# Patient Record
Sex: Female | Born: 1935
Health system: Southern US, Community
[De-identification: ages and names within clinical notes are randomized; demographics above are authoritative.]

## PROBLEM LIST (undated history)

## (undated) DIAGNOSIS — E785 Hyperlipidemia, unspecified: Secondary | ICD-10-CM

## (undated) DIAGNOSIS — F329 Major depressive disorder, single episode, unspecified: Secondary | ICD-10-CM

## (undated) DIAGNOSIS — F419 Anxiety disorder, unspecified: Secondary | ICD-10-CM

## (undated) DIAGNOSIS — R609 Edema, unspecified: Secondary | ICD-10-CM

## (undated) DIAGNOSIS — E669 Obesity, unspecified: Secondary | ICD-10-CM

## (undated) DIAGNOSIS — I219 Acute myocardial infarction, unspecified: Secondary | ICD-10-CM

## (undated) DIAGNOSIS — IMO0001 Reserved for inherently not codable concepts without codable children: Secondary | ICD-10-CM

## (undated) DIAGNOSIS — R059 Cough, unspecified: Secondary | ICD-10-CM

## (undated) DIAGNOSIS — F32A Depression, unspecified: Secondary | ICD-10-CM

## (undated) DIAGNOSIS — Z9889 Other specified postprocedural states: Secondary | ICD-10-CM

## (undated) DIAGNOSIS — R05 Cough: Secondary | ICD-10-CM

## (undated) DIAGNOSIS — R0989 Other specified symptoms and signs involving the circulatory and respiratory systems: Secondary | ICD-10-CM

## (undated) DIAGNOSIS — I10 Essential (primary) hypertension: Secondary | ICD-10-CM

## (undated) DIAGNOSIS — R112 Nausea with vomiting, unspecified: Secondary | ICD-10-CM

## (undated) DIAGNOSIS — I6529 Occlusion and stenosis of unspecified carotid artery: Secondary | ICD-10-CM

## (undated) DIAGNOSIS — I2510 Atherosclerotic heart disease of native coronary artery without angina pectoris: Secondary | ICD-10-CM

## (undated) DIAGNOSIS — I739 Peripheral vascular disease, unspecified: Secondary | ICD-10-CM

## (undated) DIAGNOSIS — E119 Type 2 diabetes mellitus without complications: Secondary | ICD-10-CM

## (undated) DIAGNOSIS — I872 Venous insufficiency (chronic) (peripheral): Secondary | ICD-10-CM

## (undated) DIAGNOSIS — I209 Angina pectoris, unspecified: Secondary | ICD-10-CM

## (undated) HISTORY — DX: Acute myocardial infarction, unspecified: I21.9

## (undated) HISTORY — DX: Type 2 diabetes mellitus without complications: E11.9

## (undated) HISTORY — PX: EYE SURGERY: SHX253

## (undated) HISTORY — DX: Occlusion and stenosis of unspecified carotid artery: I65.29

## (undated) HISTORY — DX: Obesity, unspecified: E66.9

## (undated) HISTORY — DX: Essential (primary) hypertension: I10

## (undated) HISTORY — DX: Atherosclerotic heart disease of native coronary artery without angina pectoris: I25.10

## (undated) HISTORY — DX: Hyperlipidemia, unspecified: E78.5

## (undated) HISTORY — DX: Peripheral vascular disease, unspecified: I73.9

## (undated) HISTORY — DX: Venous insufficiency (chronic) (peripheral): I87.2

## (undated) HISTORY — DX: Edema, unspecified: R60.9

## (undated) HISTORY — DX: Other specified symptoms and signs involving the circulatory and respiratory systems: R09.89

---

## 1993-07-26 DIAGNOSIS — I219 Acute myocardial infarction, unspecified: Secondary | ICD-10-CM

## 1993-07-26 DIAGNOSIS — I209 Angina pectoris, unspecified: Secondary | ICD-10-CM

## 1993-07-26 HISTORY — DX: Acute myocardial infarction, unspecified: I21.9

## 1993-07-26 HISTORY — DX: Angina pectoris, unspecified: I20.9

## 2004-07-26 HISTORY — PX: CORONARY ANGIOPLASTY WITH STENT PLACEMENT: SHX49

## 2004-09-16 ENCOUNTER — Inpatient Hospital Stay (HOSPITAL_COMMUNITY): Admission: EM | Admit: 2004-09-16 | Discharge: 2004-09-18 | Payer: Self-pay | Admitting: Emergency Medicine

## 2004-09-16 ENCOUNTER — Ambulatory Visit: Payer: Self-pay | Admitting: Cardiovascular Disease

## 2004-09-16 ENCOUNTER — Ambulatory Visit: Payer: Self-pay | Admitting: Family Medicine

## 2004-09-29 ENCOUNTER — Ambulatory Visit: Payer: Self-pay | Admitting: Cardiovascular Disease

## 2004-10-29 ENCOUNTER — Ambulatory Visit: Payer: Self-pay

## 2004-11-03 ENCOUNTER — Ambulatory Visit: Payer: Self-pay | Admitting: Cardiology

## 2004-11-19 ENCOUNTER — Ambulatory Visit: Payer: Self-pay | Admitting: Cardiology

## 2004-11-24 ENCOUNTER — Ambulatory Visit: Payer: Self-pay | Admitting: Cardiology

## 2004-11-24 ENCOUNTER — Ambulatory Visit (HOSPITAL_COMMUNITY): Admission: RE | Admit: 2004-11-24 | Discharge: 2004-11-24 | Payer: Self-pay | Admitting: Cardiology

## 2004-12-14 ENCOUNTER — Ambulatory Visit: Payer: Self-pay | Admitting: Cardiology

## 2005-02-23 ENCOUNTER — Ambulatory Visit: Payer: Self-pay | Admitting: Cardiology

## 2005-02-26 ENCOUNTER — Ambulatory Visit (HOSPITAL_COMMUNITY): Admission: RE | Admit: 2005-02-26 | Discharge: 2005-02-27 | Payer: Self-pay | Admitting: Cardiology

## 2005-02-26 ENCOUNTER — Ambulatory Visit: Payer: Self-pay | Admitting: Cardiology

## 2005-04-05 ENCOUNTER — Ambulatory Visit: Payer: Self-pay | Admitting: Internal Medicine

## 2005-08-06 ENCOUNTER — Ambulatory Visit: Payer: Self-pay | Admitting: Cardiovascular Disease

## 2005-09-07 ENCOUNTER — Ambulatory Visit: Payer: Self-pay | Admitting: Cardiology

## 2005-10-05 ENCOUNTER — Ambulatory Visit: Payer: Self-pay | Admitting: Cardiology

## 2005-12-02 ENCOUNTER — Ambulatory Visit: Payer: Self-pay | Admitting: Cardiovascular Disease

## 2006-07-05 ENCOUNTER — Ambulatory Visit: Payer: Self-pay | Admitting: Cardiovascular Disease

## 2006-08-02 ENCOUNTER — Ambulatory Visit: Payer: Self-pay

## 2007-01-11 ENCOUNTER — Ambulatory Visit: Payer: Self-pay | Admitting: Cardiovascular Disease

## 2007-01-11 LAB — CONVERTED CEMR LAB
ALT: 15 units/L (ref 0–40)
Albumin: 3.7 g/dL (ref 3.5–5.2)
BUN: 17 mg/dL (ref 6–23)
Basophils Absolute: 0.1 10*3/uL (ref 0.0–0.1)
Basophils Relative: 0.6 % (ref 0.0–1.0)
CO2: 30 meq/L (ref 19–32)
Calcium: 9.5 mg/dL (ref 8.4–10.5)
Eosinophils Absolute: 0.3 10*3/uL (ref 0.0–0.6)
Eosinophils Relative: 2.6 % (ref 0.0–5.0)
Free T4: 0.9 ng/dL (ref 0.6–1.6)
GFR calc non Af Amer: 52 mL/min
Glucose, Bld: 179 mg/dL — ABNORMAL HIGH (ref 70–99)
Hemoglobin: 13.8 g/dL (ref 12.0–15.0)
MCV: 91.1 fL (ref 78.0–100.0)
Potassium: 4.3 meq/L (ref 3.5–5.1)
RBC: 4.58 M/uL (ref 3.87–5.11)
Sodium: 142 meq/L (ref 135–145)
TSH: 4.58 microintl units/mL (ref 0.35–5.50)
WBC: 10.8 10*3/uL — ABNORMAL HIGH (ref 4.5–10.5)

## 2007-02-28 ENCOUNTER — Ambulatory Visit: Payer: Self-pay

## 2007-08-11 ENCOUNTER — Ambulatory Visit: Payer: Self-pay | Admitting: Cardiovascular Disease

## 2008-03-14 ENCOUNTER — Ambulatory Visit: Payer: Self-pay | Admitting: Cardiovascular Disease

## 2008-03-14 ENCOUNTER — Ambulatory Visit: Payer: Self-pay

## 2008-04-12 ENCOUNTER — Ambulatory Visit: Payer: Self-pay | Admitting: Internal Medicine

## 2008-11-01 ENCOUNTER — Encounter: Payer: Self-pay | Admitting: Cardiovascular Disease

## 2008-11-01 ENCOUNTER — Ambulatory Visit: Payer: Self-pay

## 2008-11-01 ENCOUNTER — Ambulatory Visit: Payer: Self-pay | Admitting: Cardiovascular Disease

## 2008-11-25 ENCOUNTER — Telehealth: Payer: Self-pay | Admitting: Cardiovascular Disease

## 2009-05-29 DIAGNOSIS — I872 Venous insufficiency (chronic) (peripheral): Secondary | ICD-10-CM

## 2009-05-29 DIAGNOSIS — I10 Essential (primary) hypertension: Secondary | ICD-10-CM

## 2009-05-29 DIAGNOSIS — E785 Hyperlipidemia, unspecified: Secondary | ICD-10-CM

## 2009-05-29 DIAGNOSIS — E1159 Type 2 diabetes mellitus with other circulatory complications: Secondary | ICD-10-CM | POA: Insufficient documentation

## 2009-05-29 DIAGNOSIS — I2510 Atherosclerotic heart disease of native coronary artery without angina pectoris: Secondary | ICD-10-CM

## 2009-05-29 DIAGNOSIS — E1169 Type 2 diabetes mellitus with other specified complication: Secondary | ICD-10-CM | POA: Insufficient documentation

## 2009-05-29 DIAGNOSIS — I739 Peripheral vascular disease, unspecified: Secondary | ICD-10-CM

## 2009-05-29 DIAGNOSIS — R609 Edema, unspecified: Secondary | ICD-10-CM

## 2009-05-30 ENCOUNTER — Ambulatory Visit: Payer: Self-pay | Admitting: Cardiovascular Disease

## 2009-05-30 ENCOUNTER — Ambulatory Visit: Payer: Self-pay

## 2009-05-30 DIAGNOSIS — R0989 Other specified symptoms and signs involving the circulatory and respiratory systems: Secondary | ICD-10-CM

## 2009-05-30 DIAGNOSIS — F17200 Nicotine dependence, unspecified, uncomplicated: Secondary | ICD-10-CM | POA: Insufficient documentation

## 2009-10-08 ENCOUNTER — Encounter (INDEPENDENT_AMBULATORY_CARE_PROVIDER_SITE_OTHER): Payer: Self-pay | Admitting: *Deleted

## 2009-11-25 ENCOUNTER — Ambulatory Visit: Payer: Self-pay | Admitting: Cardiovascular Disease

## 2009-11-25 ENCOUNTER — Telehealth: Payer: Self-pay | Admitting: Cardiovascular Disease

## 2009-12-30 ENCOUNTER — Ambulatory Visit: Payer: Self-pay

## 2009-12-30 ENCOUNTER — Encounter: Payer: Self-pay | Admitting: Cardiovascular Disease

## 2010-01-14 ENCOUNTER — Ambulatory Visit: Payer: Self-pay | Admitting: Cardiovascular Disease

## 2010-02-19 ENCOUNTER — Ambulatory Visit: Payer: Self-pay | Admitting: Vascular Surgery

## 2010-02-19 ENCOUNTER — Encounter: Payer: Self-pay | Admitting: Cardiovascular Disease

## 2010-06-05 ENCOUNTER — Encounter (INDEPENDENT_AMBULATORY_CARE_PROVIDER_SITE_OTHER): Payer: Self-pay | Admitting: *Deleted

## 2010-06-05 ENCOUNTER — Ambulatory Visit: Payer: Self-pay | Admitting: Cardiovascular Disease

## 2010-08-21 ENCOUNTER — Ambulatory Visit
Admission: RE | Admit: 2010-08-21 | Discharge: 2010-08-21 | Payer: Self-pay | Source: Home / Self Care | Attending: Cardiovascular Disease | Admitting: Cardiovascular Disease

## 2010-08-21 ENCOUNTER — Encounter: Payer: Self-pay | Admitting: Cardiovascular Disease

## 2010-08-21 ENCOUNTER — Ambulatory Visit: Admission: RE | Admit: 2010-08-21 | Discharge: 2010-08-21 | Payer: Self-pay | Source: Home / Self Care

## 2010-08-25 NOTE — Letter (Signed)
Summary: Appointment - Reminder 2  [facility], Main Office  [address]   [place], [place] [address]   Phone: [phone]  Fax: [phone]     October 08, 2009 MRN: [id number]   [facility] [address] [place], [place]  [address]   Dear Ms. Orchard,  Our records indicate that it is time to schedule a follow-up appointment with Dr. Eden Emms. It is very important that we reach you to schedule this appointment. We look forward to participating in your health care needs. Please contact us at the number listed above at your earliest convenience to schedule your appointment.  If you are unable to make an appointment at this time, give [place] a call so we can update our records.     Sincerely,   Migdalia Dk [place] Scheduling Team

## 2010-08-25 NOTE — Assessment & Plan Note (Signed)
Summary: F6M/DM   History of Present Illness: [name] [name] returns today for followup.  She has had previous stenting of theright coronary artery.  She has had resting tachycardia.  We started her on beta-blockers and she improved.  We did not find a good reason forher tachycardia, although she is diabetic and may have some autonomic dysfunction.  She has good LV function. She has bilateral carotid disease. Her last duplex in 11/10 showed 40-59% bilateral disease.  She needs a F/U this month.  There have been no TIA like symptoms. She has smoked since age 89.  She is not motivated to quit.  I counseled her for less than 10 minutes She had previously tried patches 2 years agao with limited success and they were too expensive. She does not want to try Chantix or Welbutrin.   Her BS has been reasonably controlled despite a poor diet.    Current Problems (verified): 1)  Smoker  (ICD-305.1) 2)  Carotid Artery Disease  (ICD-433.10) 3)  Venous Insufficiency  (ICD-459.81) 4)  Hyperlipidemia  (ICD-272.4) 5)  Hypertension  (ICD-401.9) 6)  Pvd  (ICD-443.9) 7)  Dm  (ICD-250.00) 8)  Cad  (ICD-414.00) 9)  Edema  (ICD-782.3) 10)  Obesity  (ICD-278.00)  Current Medications (verified): 1)  Plavix 75 Mg Tabs (Clopidogrel Bisulfate) .Marland Kitchen.. 1 Tab By Mouth Once Daily 2)  Aspirin 325 Mg  Tabs (Aspirin) .Marland Kitchen.. 1 Tab By Mouth Once Daily 3)  Lisinopril-Hydrochlorothiazide 20-12.5 Mg Tabs (Lisinopril-Hydrochlorothiazide) .Marland Kitchen.. 1 Tab By Mouth Once Daily 4)  Lipitor 80 Mg Tabs (Atorvastatin Calcium) .Marland Kitchen.. 1 Tab By Mouth Once Daily 5)  Janumet 50-500 Mg Tabs (Sitagliptin-Metformin Hcl) .Marland Kitchen.. 1 Tab By Mouth Two Times A Day 6)  Pletal 100 Mg Tabs (Cilostazol) .Marland Kitchen.. 1 Tab By Mouth Two Times A Day 7)  Glimepiride 2 Mg Tabs (Glimepiride) .Marland Kitchen.. 1 Tab By Mouth Once Daily 8)  Toprol Xl 50 Mg Xr24h-Tab (Metoprolol Succinate) .Marland Kitchen.. 1 and 1/2 Tab Two Times A Day 9)  Nitrostat 0.4 Mg Subl (Nitroglycerin) .Marland Kitchen.. 1 Tablet Under Tongue At Onset of  Chest Pain; You May Repeat Every 5 Minutes For Up To 3 Doses.  Allergies (verified): No Known Drug Allergies  Past History:  Past Medical History: Last updated: 05/30/2009 Bruit IMI with RCA stent Hyperlipidemia Hypertensoin Smoking  Past Surgical History: Last updated: 05/29/2009  PROCEDURE:  Left heart catheterization, coronary angiography, left ventriculography, bare metal stenting of the proximal right coronary artery. Successful placement of bare-metal stent in the proximal right coronary artery with excellent angiographic result.  She should be continued on Plavix for at least a year and aspirin should be  continued indefinitely.  Statin, beta blocker and ACE inhibitor have been initiated during this hospitalization and should be continued. WED/MEDQ  D:  09/17/2004  T:  09/18/2004  Job:  [id number] cc:   Delaney Meigs, M.D.  Charlton Haws, M.D.    Unsuccessful right common iliac artery angioplasty.  Family History: Last updated: 05/30/2009 non-contributory  Social History: Last updated: 05/30/2009 Retired Smoker since age 9 Non-drinker Sedentary  Review of Systems       Denies fever, malais, weight loss, blurry vision, decreased visual acuity, cough, sputum, SOB, hemoptysis, pleuritic pain, palpitaitons, heartburn, abdominal pain, melena, lower extremity edema, claudication, or rash.   Vital Signs:  Patient profile:   75 year old female Height:      61 inches Weight:      205 pounds BMI:     38.87 Pulse rate:   77 /  minute Resp:     12 per minute BP sitting:   120 / 80  (left arm)  Vitals Entered By: Kem Parkinson (Nov 25, 2009 10:07 AM)  Physical Exam  General:  Affect appropriate Healthy:  appears stated age HEENT: normal Neck supple with no adenopathy JVP normal bilateral  bruits no thyromegaly Lungs clear with no wheezing and good diaphragmatic motion Heart:  S1/S2 no murmur,rub, gallop or click PMI normal Abdomen: benighn, BS positve, no  tenderness, no AAA no bruit.  No HSM or HJR Distal pulses intact with no bruits Plus one bilateral edema Neuro non-focal Skin warm and dry    Impression & Recommendations:  Problem # 1:  SMOKER (ICD-305.1) Counseled not motivated to quit.  Will call in Welbutrin at her request  Problem # 2:  CAROTID ARTERY DISEASE (ICD-433.10) F/U duplex this month.  Continue antiplatlet Rx Her updated medication list for this problem includes:    Plavix 75 Mg Tabs (Clopidogrel bisulfate) .Marland Kitchen... 1 tab by mouth once daily    Aspirin 325 Mg Tabs (Aspirin) .Marland Kitchen... 1 tab by mouth once daily    Pletal 100 Mg Tabs (Cilostazol) .Marland Kitchen... 1 tab by mouth two times a day  Orders: Carotid Duplex (Carotid Duplex)  Problem # 3:  VENOUS INSUFFICIENCY (ICD-459.81) Continue as needed diuretic and weigth loss efforts.  Low sodium diet  Problem # 4:  HYPERTENSION (ICD-401.9) Well controlled Her updated medication list for this problem includes:    Aspirin 325 Mg Tabs (Aspirin) .Marland Kitchen... 1 tab by mouth once daily    Lisinopril-hydrochlorothiazide 20-12.5 Mg Tabs (Lisinopril-hydrochlorothiazide) .Marland Kitchen... 1 tab by mouth once daily    Toprol Xl 50 Mg Xr24h-tab (Metoprolol succinate) .Marland Kitchen... 1 and 1/2 tab two times a day  Problem # 5:  CAD (ICD-414.00) Stable no angina Continue ASA and BB Her updated medication list for this problem includes:    Plavix 75 Mg Tabs (Clopidogrel bisulfate) .Marland Kitchen... 1 tab by mouth once daily    Aspirin 325 Mg Tabs (Aspirin) .Marland Kitchen... 1 tab by mouth once daily    Lisinopril-hydrochlorothiazide 20-12.5 Mg Tabs (Lisinopril-hydrochlorothiazide) .Marland Kitchen... 1 tab by mouth once daily    Pletal 100 Mg Tabs (Cilostazol) .Marland Kitchen... 1 tab by mouth two times a day    Toprol Xl 50 Mg Xr24h-tab (Metoprolol succinate) .Marland Kitchen... 1 and 1/2 tab two times a day    Nitrostat 0.4 Mg Subl (Nitroglycerin) .Marland Kitchen... 1 tablet under tongue at onset of chest pain; you may repeat every 5 minutes for up to 3 doses.  Problem # 6:  PVD  (ICD-443.9) Stable with minimal claudication.  Continue Pletal  Patient Instructions: 1)  Your physician recommends that you schedule a follow-up appointment in: 6 MONTHS 2)  Your physician has requested that you have a carotid duplex. This test is an ultrasound of the carotid arteries in your neck. It looks at blood flow through these arteries that supply the brain with blood. Allow one hour for this exam. There are no restrictions or special instructions.   EKG Report  Procedure date:  11/25/2009  Findings:      NSR 77 Normal ECG

## 2010-08-25 NOTE — Miscellaneous (Signed)
Summary: Orders Update  Clinical Lists Changes  Orders: Added new Test order of Arterial Duplex Lower Extremity (Arterial Duplex Low) - Signed 

## 2010-08-25 NOTE — Progress Notes (Signed)
Summary: pt needs refill  Phone Note Refill Request Message from:  Pharmacy on [facility] [address]  Refills Requested: Medication #1:  LISINOPRIL 40 MG TABS 1 tab by mouth once daily per pharm it is suppose to be with HTCZ  Initial call taken by: Omer Jack,  Nov 25, 2009 10:13 AM  Follow-up for Phone Call        Pharmacy states its lisinopril 20-12.5 i gave pt 1 year supply Follow-up by: Kem Parkinson,  Nov 25, 2009 10:30 AM

## 2010-08-25 NOTE — Letter (Signed)
Summary: [facility]   Imported By: Roderic Ovens 03/04/2010 16:24:48  _____________________________________________________________________  External Attachment:    Type:   Image     Comment:   External Document

## 2010-08-25 NOTE — Assessment & Plan Note (Signed)
Summary: 6 MONTH ROV/SL   Visit Type:  Follow-up Primary Provider:  Darl Pikes weeks, PA   History of Present Illness: [name] [name] returns today for followup.  She has had previous stenting of theright coronary artery.  She has had resting tachycardia.  We started her on beta-blockers and she improved.  We did not find a good reason forher tachycardia, although she is diabetic and may have some autonomic dysfunction.  She has good LV function. She has bilateral carotid disease. Her last duplex in June 11 showed 60-79%  bilateral disease.  I reviewed these and was concerned since her systolic velocities were greater than 39m/sec and there was lots of calcium These were at the high end.  F/U CTA also reviewed and  indicated less than 80% disease and Dr Edilia Bo indicated he wanted to do F/U duplex in December and not proceed with surgery on left.  . I spoke with him personally about the decidsion making process in this case   There have been no TIA like symptoms.  She has smoked since age 15.  She is not motivated to quit.  I counseled her for less than 10 minutes She had previously tried patches 2 years agao with limited success and they were too expensive. She does not want to try Chantix or Welbutrin.   Her BS has been reasonably controlled despite a poor diet.   Current Problems (verified): 1)  Smoker  (ICD-305.1) 2)  Carotid Artery Disease  (ICD-433.10) 3)  Venous Insufficiency  (ICD-459.81) 4)  Hyperlipidemia  (ICD-272.4) 5)  Hypertension  (ICD-401.9) 6)  Pvd  (ICD-443.9) 7)  Dm  (ICD-250.00) 8)  Cad  (ICD-414.00) 9)  Edema  (ICD-782.3) 10)  Obesity  (ICD-278.00)  Current Medications (verified): 1)  Plavix 75 Mg Tabs (Clopidogrel Bisulfate) .Marland Kitchen.. 1 Tab By Mouth Once Daily 2)  Aspirin 325 Mg  Tabs (Aspirin) .Marland Kitchen.. 1 Tab By Mouth Once Daily 3)  Lisinopril-Hydrochlorothiazide 20-12.5 Mg Tabs (Lisinopril-Hydrochlorothiazide) .Marland Kitchen.. 1 Tab By Mouth Once Daily 4)  Lipitor 80 Mg Tabs (Atorvastatin Calcium) .Marland Kitchen..  1 Tab By Mouth Once Daily 5)  Janumet 50-500 Mg Tabs (Sitagliptin-Metformin Hcl) .Marland Kitchen.. 1 Tab By Mouth Two Times A Day 6)  Pletal 100 Mg Tabs (Cilostazol) .Marland Kitchen.. 1 Tab By Mouth Two Times A Day 7)  Glimepiride 2 Mg Tabs (Glimepiride) .Marland Kitchen.. 1 Tab By Mouth Once Daily 8)  Toprol Xl 50 Mg Xr24h-Tab (Metoprolol Succinate) .Marland Kitchen.. 1 and 1/2 Tab Two Times A Day 9)  Nitrostat 0.4 Mg Subl (Nitroglycerin) .Marland Kitchen.. 1 Tablet Under Tongue At Onset of Chest Pain; You May Repeat Every 5 Minutes For Up To 3 Doses.  Allergies (verified): No Known Drug Allergies  Past History:  Past Medical History: Last updated: 05/30/2009 Bruit IMI with RCA stent Hyperlipidemia Hypertensoin Smoking  Past Surgical History: Last updated: 05/29/2009  PROCEDURE:  Left heart catheterization, coronary angiography, left ventriculography, bare metal stenting of the proximal right coronary artery. Successful placement of bare-metal stent in the proximal right coronary artery with excellent angiographic result.  She should be continued on Plavix for at least a year and aspirin should be  continued indefinitely.  Statin, beta blocker and ACE inhibitor have been initiated during this hospitalization and should be continued. WED/MEDQ  D:  09/17/2004  T:  09/18/2004  Job:  [id number] cc:   Delaney Meigs, M.D.  Charlton Haws, M.D.    Unsuccessful right common iliac artery angioplasty.  Family History: Last updated: 05/30/2009 non-contributory  Social History: Last updated: 05/30/2009 Retired  Smoker since age 45 Non-drinker Sedentary  Review of Systems       Denies fever, malais, weight loss, blurry vision, decreased visual acuity, cough, sputum, SOB, hemoptysis, pleuritic pain, palpitaitons, heartburn, abdominal pain, melena, lower extremity edema, claudication, or rash.   Vital Signs:  Patient profile:   75 year old female Height:      61 inches Weight:      219 pounds BMI:     41.53 Pulse rate:   85 / minute BP sitting:    138 / 52  (left arm)  Vitals Entered By: Laurance Flatten CMA (June 05, 2010 10:39 AM)  Physical Exam  General:  Affect appropriate Healthy:  appears stated age HEENT: normal Neck supple with no adenopathy JVP normal BILATERAL  bruits no thyromegaly Lungs clear with no wheezing and good diaphragmatic motion Heart:  S1/S2 SEm murmur,rub, gallop or click PMI normal Abdomen: benighn, BS positve, no tenderness, no AAA right  bruit.  No HSM or HJR Distal pulses intact  on left and diminished on right  No edema Neuro non-focal Skin warm and dry    Impression & Recommendations:  Problem # 1:  SMOKER (ICD-305.1) Offerred referral to [facility] and [facility]  Also offerred Chantix or Welbutrin.  Risk of progressive carotid disease with continued smoking discussed.  Patient to call if she is willing to F/ with recomendation  Problem # 2:  CAROTID ARTERY DISEASE (ICD-433.10) F/U duplex in January.  Will likely need left CEA within 2 years  Contineu aSA and Plavix Her updated medication list for this problem includes:    Plavix 75 Mg Tabs (Clopidogrel bisulfate) .Marland Kitchen... 1 tab by mouth once daily    Aspirin 325 Mg Tabs (Aspirin) .Marland Kitchen... 1 tab by mouth once daily    Pletal 100 Mg Tabs (Cilostazol) .Marland Kitchen... 1 tab by mouth two times a day  Orders: EKG w/ Interpretation (93000) Carotid Duplex (Carotid Duplex)  Problem # 3:  HYPERLIPIDEMIA (ICD-272.4) Continue high dose statin labs per primary  Target LDL less than 70 Her updated medication list for this problem includes:    Lipitor 80 Mg Tabs (Atorvastatin calcium) .Marland Kitchen... 1 tab by mouth once daily  Problem # 4:  HYPERTENSION (ICD-401.9) Well controleed Her updated medication list for this problem includes:    Aspirin 325 Mg Tabs (Aspirin) .Marland Kitchen... 1 tab by mouth once daily    Lisinopril-hydrochlorothiazide 20-12.5 Mg Tabs (Lisinopril-hydrochlorothiazide) .Marland Kitchen... 1 tab by mouth once daily    Toprol Xl 50 Mg Xr24h-tab (Metoprolol succinate) .Marland Kitchen... 1 and 1/2  tab two times a day  Problem # 5:  CAD (ICD-414.00) Stabel no angina.   Her updated medication list for this problem includes:    Plavix 75 Mg Tabs (Clopidogrel bisulfate) .Marland Kitchen... 1 tab by mouth once daily    Aspirin 325 Mg Tabs (Aspirin) .Marland Kitchen... 1 tab by mouth once daily    Lisinopril-hydrochlorothiazide 20-12.5 Mg Tabs (Lisinopril-hydrochlorothiazide) .Marland Kitchen... 1 tab by mouth once daily    Pletal 100 Mg Tabs (Cilostazol) .Marland Kitchen... 1 tab by mouth two times a day    Toprol Xl 50 Mg Xr24h-tab (Metoprolol succinate) .Marland Kitchen... 1 and 1/2 tab two times a day    Nitrostat 0.4 Mg Subl (Nitroglycerin) .Marland Kitchen... 1 tablet under tongue at onset of chest pain; you may repeat every 5 minutes for up to 3 doses.  Problem # 6:  PVD (ICD-443.9) Known occlusion of right iliac with prev failed intervention by Dr Samule Ohm.  right ABI .6 or so in past.  Symptoms  stable on Pletal F.U ABI"s with duplex in January.     Patient Instructions: 1)  Your physician recommends that you schedule a follow-up appointment in: JANUARY  WITH DR [facility] CAROTID SAME DAY 2)  Your physician recommends that you continue on your current medications as directed. Please refer to the Current Medication list given to you today. 3)  Your physician has requested that you have a carotid duplex. This test is an ultrasound of the carotid arteries in your neck. It looks at blood flow through these arteries that supply the brain with blood. Allow one hour for this exam. There are no restrictions or special instructions.SE DR NI SHAN SAME DAY    EKG Report  Procedure date:  06/05/2010  Findings:      nsr 85 nonspecific inferolateral ST/T wave changes Abnormal ECG

## 2010-09-02 NOTE — Assessment & Plan Note (Signed)
Summary: PER CHECK OUT/ALSO CARTIOD @ 11:00/SAF   Primary Provider:  Darl Pikes weeks, PA  CC:  check up.  History of Present Illness: [name] [name] returns today for followup.  She has had previous stenting of the right coronary artery.  She has had resting tachycardia.  We started her on beta-blockers and she improved.  We did not find a good reason forher tachycardia, although she is diabetic and may have some autonomic dysfunction.  She has good LV function. She has bilateral carotid disease.  I reviewed duplex from June  and was concerned since her systolic velocities were greater than 52m/sec and there was lots of calcium These were at the high end.  F/U CTA also reviewed and  indicated less than 80% disease and Dr Edilia Bo . I spoke with him personally about the decidsion making process in this case   There have been no TIA like symptoms.  Duplex today was stable   She has smoked since age 73.  She is not motivated to quit.  I counseled her for less than 10 minutes She had previously tried patches 2 years agao with limited success and they were too expensive. She does not want to try Chantix or Welbutrin.   Her BS has been reasonably controlled despite a poor diet.   Current Problems (verified): 1)  Smoker  (ICD-305.1) 2)  Carotid Artery Disease  (ICD-433.10) 3)  Venous Insufficiency  (ICD-459.81) 4)  Hyperlipidemia  (ICD-272.4) 5)  Hypertension  (ICD-401.9) 6)  Pvd  (ICD-443.9) 7)  Dm  (ICD-250.00) 8)  Cad  (ICD-414.00) 9)  Edema  (ICD-782.3) 10)  Obesity  (ICD-278.00)  Current Medications (verified): 1)  Plavix 75 Mg Tabs (Clopidogrel Bisulfate) .Marland Kitchen.. 1 Tab By Mouth Once Daily 2)  Aspirin 325 Mg  Tabs (Aspirin) .Marland Kitchen.. 1 Tab By Mouth Once Daily 3)  Lisinopril-Hydrochlorothiazide 20-12.5 Mg Tabs (Lisinopril-Hydrochlorothiazide) .Marland Kitchen.. 1 Tab By Mouth Once Daily 4)  Lipitor 80 Mg Tabs (Atorvastatin Calcium) .Marland Kitchen.. 1 Tab By Mouth Once Daily 5)  Janumet 50-500 Mg Tabs (Sitagliptin-Metformin Hcl) .Marland Kitchen.. 1 Tab  By Mouth Two Times A Day 6)  Pletal 100 Mg Tabs (Cilostazol) .Marland Kitchen.. 1 Tab By Mouth Two Times A Day 7)  Glimepiride 2 Mg Tabs (Glimepiride) .Marland Kitchen.. 1 Tab By Mouth Once Daily 8)  Toprol Xl 50 Mg Xr24h-Tab (Metoprolol Succinate) .Marland Kitchen.. 1 and 1/2 Tab Two Times A Day 9)  Nitrostat 0.4 Mg Subl (Nitroglycerin) .Marland Kitchen.. 1 Tablet Under Tongue At Onset of Chest Pain; You May Repeat Every 5 Minutes For Up To 3 Doses.  Allergies (verified): No Known Drug Allergies  Past History:  Past Medical History: Last updated: 05/30/2009 Bruit IMI with RCA stent Hyperlipidemia Hypertensoin Smoking  Past Surgical History: Last updated: 05/29/2009  PROCEDURE:  Left heart catheterization, coronary angiography, left ventriculography, bare metal stenting of the proximal right coronary artery. Successful placement of bare-metal stent in the proximal right coronary artery with excellent angiographic result.  She should be continued on Plavix for at least a year and aspirin should be  continued indefinitely.  Statin, beta blocker and ACE inhibitor have been initiated during this hospitalization and should be continued. WED/MEDQ  D:  09/17/2004  T:  09/18/2004  Job:  [id number] cc:   Delaney Meigs, M.D.  Charlton Haws, M.D.    Unsuccessful right common iliac artery angioplasty.  Family History: Last updated: 05/30/2009 non-contributory  Social History: Last updated: 05/30/2009 Retired Smoker since age 46 Non-drinker Sedentary  Review of Systems  Denies fever, malais, weight loss, blurry vision, decreased visual acuity, cough, sputum, SOB, hemoptysis, pleuritic pain, palpitaitons, heartburn, abdominal pain, melena, lower extremity edema, claudication, or rash.   Vital Signs:  Patient profile:   75 year old female Height:      61 inches Weight:      219 pounds BMI:     41.53 Pulse rate:   82 / minute Resp:     12 per minute BP sitting:   143 / 71  (left arm)  Vitals Entered By: Kem Parkinson (August 21, 2010 11:44 AM)  Physical Exam  General:  Affect appropriate Healthy:  appears stated age HEENT: normal Neck supple with no adenopathy JVP normal bilateral  bruits no thyromegaly Lungs clear with no wheezing and good diaphragmatic motion Heart:  S1/S2 no murmur,rub, gallop or click PMI normal Abdomen: benighn, BS positve, no tenderness, no AAA no bruit.  No HSM or HJR Distal pulses intact with no bruits No edema Neuro non-focal Skin warm and dry    Impression & Recommendations:  Problem # 1:  SMOKER (ICD-305.1) No motivatin to quit.  Association with stroke and carotid disease discussed.  Refused Chantix and Welbutrin  Problem # 2:  CAROTID ARTERY DISEASE (ICD-433.10)  Stable duplex.  F/U 6 months Her updated medication list for this problem includes:    Plavix 75 Mg Tabs (Clopidogrel bisulfate) .Marland Kitchen... 1 tab by mouth once daily    Aspirin 325 Mg Tabs (Aspirin) .Marland Kitchen... 1 tab by mouth once daily    Pletal 100 Mg Tabs (Cilostazol) .Marland Kitchen... 1 tab by mouth two times a day  Her updated medication list for this problem includes:    Plavix 75 Mg Tabs (Clopidogrel bisulfate) .Marland Kitchen... 1 tab by mouth once daily    Aspirin 325 Mg Tabs (Aspirin) .Marland Kitchen... 1 tab by mouth once daily    Pletal 100 Mg Tabs (Cilostazol) .Marland Kitchen... 1 tab by mouth two times a day  Problem # 3:  HYPERTENSION (ICD-401.9)  Well contorlled Her updated medication list for this problem includes:    Aspirin 325 Mg Tabs (Aspirin) .Marland Kitchen... 1 tab by mouth once daily    Lisinopril-hydrochlorothiazide 20-12.5 Mg Tabs (Lisinopril-hydrochlorothiazide) .Marland Kitchen... 1 tab by mouth once daily    Toprol Xl 50 Mg Xr24h-tab (Metoprolol succinate) .Marland Kitchen... 1 and 1/2 tab two times a day  Her updated medication list for this problem includes:    Aspirin 325 Mg Tabs (Aspirin) .Marland Kitchen... 1 tab by mouth once daily    Lisinopril-hydrochlorothiazide 20-12.5 Mg Tabs (Lisinopril-hydrochlorothiazide) .Marland Kitchen... 1 tab by mouth once daily    Toprol Xl 50 Mg Xr24h-tab  (Metoprolol succinate) .Marland Kitchen... 1 and 1/2 tab two times a day  Problem # 4:  HYPERLIPIDEMIA (ICD-272.4) Labs per primary  Continue statin.  Target LDL 75 or less Her updated medication list for this problem includes:    Lipitor 80 Mg Tabs (Atorvastatin calcium) .Marland Kitchen... 1 tab by mouth once daily  Her updated medication list for this problem includes:    Lipitor 80 Mg Tabs (Atorvastatin calcium) .Marland Kitchen... 1 tab by mouth once daily  Problem # 5:  CAD (ICD-414.00)  Stable no angina Her updated medication list for this problem includes:    Plavix 75 Mg Tabs (Clopidogrel bisulfate) .Marland Kitchen... 1 tab by mouth once daily    Aspirin 325 Mg Tabs (Aspirin) .Marland Kitchen... 1 tab by mouth once daily    Lisinopril-hydrochlorothiazide 20-12.5 Mg Tabs (Lisinopril-hydrochlorothiazide) .Marland Kitchen... 1 tab by mouth once daily    Pletal 100 Mg Tabs (Cilostazol) .Marland KitchenMarland KitchenMarland KitchenMarland Kitchen  1 tab by mouth two times a day    Toprol Xl 50 Mg Xr24h-tab (Metoprolol succinate) .Marland Kitchen... 1 and 1/2 tab two times a day    Nitrostat 0.4 Mg Subl (Nitroglycerin) .Marland Kitchen... 1 tablet under tongue at onset of chest pain; you may repeat every 5 minutes for up to 3 doses.  Her updated medication list for this problem includes:    Plavix 75 Mg Tabs (Clopidogrel bisulfate) .Marland Kitchen... 1 tab by mouth once daily    Aspirin 325 Mg Tabs (Aspirin) .Marland Kitchen... 1 tab by mouth once daily    Lisinopril-hydrochlorothiazide 20-12.5 Mg Tabs (Lisinopril-hydrochlorothiazide) .Marland Kitchen... 1 tab by mouth once daily    Pletal 100 Mg Tabs (Cilostazol) .Marland Kitchen... 1 tab by mouth two times a day    Toprol Xl 50 Mg Xr24h-tab (Metoprolol succinate) .Marland Kitchen... 1 and 1/2 tab two times a day    Nitrostat 0.4 Mg Subl (Nitroglycerin) .Marland Kitchen... 1 tablet under tongue at onset of chest pain; you may repeat every 5 minutes for up to 3 doses.

## 2010-12-02 ENCOUNTER — Other Ambulatory Visit: Payer: Self-pay | Admitting: *Deleted

## 2010-12-02 MED ORDER — LISINOPRIL-HYDROCHLOROTHIAZIDE 20-12.5 MG PO TABS: 1.0000 | ORAL_TABLET | Freq: Every day | ORAL | Status: DC

## 2010-12-02 MED ORDER — CILOSTAZOL 100 MG PO TABS: 100.0000 mg | ORAL_TABLET | Freq: Two times a day (BID) | ORAL | Status: DC

## 2010-12-08 NOTE — Consult Note (Signed)
VASCULAR SURGERY CONSULTATION   [name] [name], [name] [name]  DOB:  [birth year]                                       02/19/2010  [id number]#:[id number]   I saw the patient in the office today in consultation concerning her  carotid disease.  She was referred by Dr. Eden Emms.  This is a pleasant 75-  year-old woman who is right-handed who underwent her routine carotid  duplex scan at the [place] office.  This was done on 12/30/2009.  This  showed a 60% to 79% right carotid stenosis and also a 60% to 79% left  carotid stenosis.  This prompted a CT angiogram which suggested a 75%  right carotid stenosis and a 90% left carotid stenosis.  She was sent  for vascular consultation.  Of note, she denies any history of stroke,  TIAs, expressive or receptive aphasia or amaurosis fugax.   Her past medical history is significant for adult onset diabetes.  She  does not require insulin.  In addition, she has hypertension,  hypercholesterolemia and coronary artery disease.  She had a myocardial  infarction in June of 1995.  She had a PTA and stent by Dr. Samule Ohm in  2006.  She denies any history of congestive heart failure or history of  COPD.   SOCIAL HISTORY:  She is single.  She has nine children.  She smokes a  half pack per day of cigarettes.   FAMILY HISTORY:  There is no history of premature cardiovascular  disease.   REVIEW OF SYSTEMS:  GENERAL:  She has had no recent weight loss, weight  gain or problems with her appetite.  She is 204 pounds, 5 feet tall.  CARDIOVASCULAR:  She has had no chest pain, chest pressure, palpitations  or arrhythmias.  She does admit to dyspnea on exertion.  She has had  some calf claudication.  She has had no rest pain or nonhealing ulcers.  She has had no history of DVT or phlebitis.  ENT:  She has had some change in her eyesight recently.  MUSCULOSKELETAL:  She does have a history of arthritis.  GI, neurologic, pulmonary, hematologic, GU, psychiatric and  integumentary review of systems is unremarkable and is documented on the  medical history form in her chart.   PHYSICAL EXAMINATION:  General:  This is a pleasant 75 year old woman  who appears her stated age.  Vital signs:  Blood pressure is 124/71,  heart rate is 74, saturation 98%.  HEENT:  Unremarkable.  Lungs:  Are  clear bilaterally to auscultation without rales, rhonchi or wheezing.  Cardiovascular:  She has bilateral carotid bruits.  She has a regular  rate and rhythm.  She has palpable femoral pulses and warm, well-  perfused feet without ischemic ulcers.  Abdomen:  Soft and nontender.  It is difficult to assess for an aneurysm given her size.  She has  normal pitched bowel sounds.  Musculoskeletal:  She has no major  deformities or cyanosis.  Neurological:  She has no focal weakness or  paresthesias.  Skin:  There are no ulcers or rashes.   I have reviewed her duplex scan done at the [facility] office.  By velocity  criteria she has a 60% to 79% proximal right internal carotid artery  stenosis extending into the mid internal carotid artery.  On the left  side she also has  a 60% to 79% carotid stenosis in the mid internal  carotid artery; velocities are slightly higher on the left.  End-  diastolic velocity is 73 cm/sec which would suggest that the stenosis is  clearly less than 80%.  I also reviewed her CT angiogram.  She has a  calcified lesion in the carotid bulb on the left which was interpreted  as 90% although given the degree of calcification I think it is  difficult to say the exact degree of narrowing and I put more emphasis  on the duplex scan.   I have explained that for the asymptomatic patient we typically would  not recommend carotid endarterectomy unless the stenosis progressed to  greater than 80% or she developed new neurologic symptoms.  I have  recommended a followup carotid duplex scan in 6 months and she would  like to have this done at the [place] office.   If the stenosis on either  side progresses to greater than 80% or if she developed new neurologic  symptoms we would be happy to see her back to discuss carotid  endarterectomy.  However, currently both stenoses appear to be less than  80% by duplex and she is asymptomatic so at this point I would not  recommend carotid endarterectomy.  She does know to continue taking her  aspirin and Plavix.     Di Kindle. Edilia Bo, M.D.  Electronically Signed  CSD/MEDQ  D:  02/19/2010  T:  02/20/2010  Job:  [id number]   cc:   Noralyn Pick. Eden Emms, MD, [facility]  Dr Gloris Ham

## 2010-12-08 NOTE — Assessment & Plan Note (Signed)
[facility]                            CARDIOLOGY OFFICE NOTE   [name] [name], [name] [name] KERRY ODONOHUE                         MRN:          [id number]  DATE:08/11/2007                            DOB:          November 09, 1935    She returns a follow-up.  She was previously was a patient of Dr. Samule Ohm   She has had bare-metal stenting in the right coronary by Dr. Samule Ohm in  2006.  She had a nonischemic Myoview in January 2008.  She also has  known carotid disease and peripheral vascular disease.   The patient has been compliant with her meds.  She has had a good  response to Pletal with increase in her walking ability to approximately  40 yards now.   The patient is not having significant chest pain, PND, orthopnea.  She  does have chronic lower extremity edema.  The patient has some chronic  exertional dyspnea as well.  There has been no pleuritic pain, no fever,  no cough, no sputum.  She does not have COPD.   She has been compliant with her medicine.  Since she has a vascular  pathology, we have had her on Plavix.   In regards to her vascular disease, her last duplex was in August 2008.  She had a peak  velocity in the left bulb of  2.7 meters per second with  bilateral 60-79% carotid disease, left greater than right.   The patient also has significant peripheral vascular disease.  She has  had failed percutaneous interventional events by Dr. Samule Ohm x2.  Her ABI  on the right in the 0.5 range and on the left in the 0.96 range.   The patient's review of systems otherwise negative.   CURRENT MEDICATIONS:  1. Plavix 75 mg.  2. Aspirin a day.  3. Toprol-XL 25 a day.  4. Lisinopril 40 mg a day.  5. Lipitor 80 mg a day.  6. Pletal 100 mg b.i.d.   PHYSICAL EXAMINATION:  GENERAL:  Her exam is remarkable for an  overweight, chronically ill-appearing, middle-aged white female in no  distress.  VITAL SIGNS:  Her heart rate is elevated at 100, blood pressure is  150/80,  weight is 242, afebrile, respiratory rate 16.  HEENT:  Unremarkable.  NECK:  Carotids are without bruit, no lymphadenopathy, thyromegaly, JVP  elevation.  LUNGS:  Clear with good diaphragmatic motion.  No wheezing.  CARDIOVASCULAR:  S1 and S2, normal heart sounds.  No RV lift.  Normal  PMI.  No murmur.  ABDOMEN:  Benign.  Bowel sounds positive.  No AAA.  No tenderness.  No  hepatosplenomegaly.  No hepatojugular  reflux.  She does have a right  femoral bruit and distal pulses are palpable.  EXTREMITIES:  Her feet are cold.  There is +1 lower extremity edema.  SKIN:  Skin is warm and dry.  NEUROLOGIC:  Neuro is nonfocal.  She has erythema and what almost looks  like venous insufficiency in the capillaries of both hands.  I do not  think this represents clubbing.   LABORATORY DATA:  Her EKG shows sinus rhythm with poor R wave  progression.  She is a bit tachycardiac at a rate of 100.  I did lab  work on  her in June which I reviewed.  Her hematocrit was 41.7,  potassium is 4.3, creatinine is 1.1.  LFTs were normal.  TSH was 4.58.   IMPRESSION:  1. Coronary disease, previous bare-metal stenting of the right      coronary artery.  Continue aspirin and Plavix.  2. Resting tachycardia.  Does not appear to be related to a thyroid      disease or decreased left ventricular function.  She seems to be      doing fine.  However, I am uncomfortable with her having      tachycardia given her known coronary disease.  Increase Toprol to      100 mg a day.  3. Known carotid disease.  Follow-up duplex August 2009.  Continue      aspirin and Plavix.  No transient ischemic attacks.  4. Hypertension, currently well controlled.  Continue current dose of      lisinopril and low-salt diet.  5. Hyperlipidemia in the setting of known coronary disease.  Continue      Lipitor 80 mg a day.  Liver function tests were normal.  6. Venous insufficiency.  Will have to look through the chart as the      patient may  benefit from p.r.n. diuretics if she does not have      them. Elevate legs at the end of the day.  Low-salt diet.  7. Claudication.  Follow-up ABIs in August.  Continue Pletal 100      b.i.d. and walking program.  Stable. No ulcers or limiting pain.     Noralyn Pick. Eden Emms, MD, [facility]  Electronically Signed    PCN/MedQ  DD: 08/11/2007  DT: 08/12/2007  Job #: [id number]

## 2010-12-08 NOTE — Assessment & Plan Note (Signed)
[facility]                            CARDIOLOGY OFFICE NOTE   [name] [name], [name] [name]                         MRN:          [id number]  DATE:03/14/2008                            DOB:          [birth year]    [name] [name] returns today for followup.  She has had previous angioplasty and  stenting to the right coronary artery.  She has had a resting  tachycardia.  I started her on beta-blockers previously.  The  tachycardia was not due to anemia, hyperthyroidism, or decreased LV  function.  She is a diabetic and may have an autonomic reset point such  that she is on the high side of the bell-shaped curve.  Her morphology  of her P-waves and her EKGs are normal since starting her Toprol 25  b.i.d.  Her heart rate is still running around 100-105.  I will have her  see EP to make sure we are not missing an atrial tachycardia.  She is  not having significant chest pain, PND, or orthopnea, and there has been  no palpitations.  She has chronic mild exertional dyspnea.   She has no known allergies.   Her current meds include:  1. Plavix 75 a day.  2. Aspirin a day.  3. Toprol 25 b.i.d. to be increased to 50 b.i.d.  4. Lisinopril 40 a day.  5. Lipitor 80 a day.  6. Pletal 100 b.i.d.  7. Janumet 50/500.  8. Glyburide 2 mg a day.   PHYSICAL EXAMINATION:  GENERAL:  Her exam is remarkable for an  overweight white female in no distress.  VITAL SIGNS:  Weight is 248, blood pressure is 130/80, pulse is 100-110  sinus, respiratory rate 14, afebrile.  HEENT:  Unremarkable.  NECK:  Carotids have bilateral bruits, right greater than left.  No  lymphadenopathy, thyromegaly, or JVP elevation.  LUNGS:  Clear with good diaphragmatic motion.  No wheezing.  HEART:  S1 and S2.  Distant heart sounds.  PMI not palpable.  ABDOMEN:  Protuberant.  Bowel sounds positive.  No AAA.  No tenderness.  No bruit.  No hepatosplenomegaly or hepatojugular reflux.  EXTREMITIES:  Distal pulses are  decreased with +1 PTs.  Mild lower  extremity edema.  NEURO:  Nonfocal.   IMPRESSION:  1. Coronary artery disease, previous stenting of the right coronary      artery.  Continue aspirin and Plavix.  2. Resting tachycardia.  Increase Toprol.  Follow up blood pressure.  3. Hypertension, currently well controlled.  Continue lisinopril.  4. Hyperlipidemia in the setting of coronary artery disease.  Continue      Lipitor.  Lipid and liver profile in 6 months.  5. Bilateral carotid disease.  Follow up Duplex in 6 months.  Her      carotid Duplex today showed 60%-79% left disease.     Noralyn Pick. Eden Emms, MD, [facility]  Electronically Signed    PCN/MedQ  DD: 03/14/2008  DT: 03/15/2008  Job #: [id number]

## 2010-12-08 NOTE — Assessment & Plan Note (Signed)
[facility]                            CARDIOLOGY OFFICE NOTE   [name] [name], [name] [name]                         MRN:          [id number]  DATE:11/01/2008                            DOB:          [birth year]    [name] [name] returns today for followup.  She has had previous stenting of the  right coronary artery.  She has had resting tachycardia.  We started her  on beta-blockers and she improved.  We did not find a good reason for  her tachycardia, although she is diabetic and may have some autonomic  dysfunction.  She has good LV function.   In talking to her, she has been doing well.  She denied any significant  chest pain.  She has been compliant with her medications.  She gets  Medicaid, so they are not that expensive.   She has been having some lower extremity edema.  This particularly  happens in the warmer weather, it is dependent and worsens as the day  goes on.  I suspected it involve some mild venous insufficiency.  She is  on lisinopril 40 a day and I think it would be easy to give her a  combination pill where she can take 40 of lisinopril  and 25 of HCTZ,  otherwise her sugars have been well controlled.  I do not have a  hemoglobin A1c on her, but she says her fastings are usually 80-90.   She is ambulatory.  She is doing everything she wants.  There is no PND  or orthopnea.  There is no significant claudication at this time,  although she is on Pletal and has a previous ABIs of 0.46 on the right  and 0.88 on the left.   I reviewed her ABIs from March 04, 2008.  I also reviewed her carotid  studies from November 01, 2008.  From this morning, she had 40-59% right ICA  stenosis, 60-79% left ICA stenosis.  She will need a followup in 6  months.  Her peak proximal ICA velocity on the left was 2.43 with a  diastolic velocity of 65.   She has not had any TIA like symptoms and she is on aspirin and Plavix.   She has no known allergies.   She is on;  1.  Plavix 75 a day.  2. Lisinopril 40 a day.  3. Lipitor 80 a day.  4. Pletal 100 b.i.d.  5. Janumet 50/500 b.i.d.  6. Glyburide 2 mg a day.  7. Toprol 50 b.i.d.  8. Metoprolol 25 b.i.d.   PHYSICAL EXAMINATION:  GENERAL:  Remarkable for an overweight female in  no distress.  VITAL SIGNS:  Her blood pressure is 140/80, weight is 214, pulse 70 and  regular, respiratory rate 14, and afebrile.  HEENT:  Unremarkable.  NECK:  She has left carotid bruit.  No lymphadenopathy, thyromegaly, or  JVP elevation.  LUNGS:  Clear.  Good diaphragmatic motion.  No wheezing.  S1-S2 with a  soft systolic murmur.  PMI normal.  ABDOMEN:  Benign.  Bowel sounds positive.  No AAA, no tenderness,  no  bruit.  No hepatosplenomegaly or hepatojugular reflux. EXTREMITIES:  Right femoral and popliteal and PT pulses are slightly diminished.  She  has mild dependent rubor and +1 edema bilaterally.  NEUROLOGIC:  Nonfocal.  SKIN:  Warm and dry.  MUSCULOSKELETAL:  No muscular weakness.   Again, ABIs reviewed as well as carotid duplex.   IMPRESSION:  1. Coronary artery disease, previous stenting of the right coronary      artery.  No chest pain.  Consider followup Myoview in a year.      Continue aspirin, Plavix, and beta-blocker.  2. Moderate carotid disease.  Follow up carotid duplex in 6 months.      No transient ischemic attack like symptoms.  3. Lower extremity edema.  Start the equivalent of hydrochlorothiazide      25 mg a day as combination pill, avoid salt, elevate legs at the      end of the day.  4. Diabetes.  Follow-up primary care MD in [place].  Hemoglobin A1c      quarterly.  Continue current oral hypoglycemics.  5. Resting tachycardia resolved on beta-blocker therapy, it may be      related autonomic dysfunction.  6. Peripheral vascular disease, currently not having significant      claudication, improved symptoms on Pletal, no nonhealing ulcers.      She knows to be very careful with her feet and  to call me if her      symptoms were to progress in regards to a leg pain with ambulation.   I will see her back in about 6 months.     Noralyn Pick. Eden Emms, MD, [facility]  Electronically Signed    PCN/MedQ  DD: 11/01/2008  DT: 11/02/2008  Job #: [id number]

## 2010-12-08 NOTE — Letter (Signed)
April 12, 2008    Noralyn Pick. Eden Emms, MD, [facility]  [address]  [address]  [place], [place] [address]   RE:  [name], [name]  MRN:  [id number]  /  DOB:  19-Oct-1935   Dear Theron Arista,   It was a pleasure to see [name] [name] today at your request regarding her  tachycardia.  As you know, she has coronary artery disease with bare-  metal stenting of her right coronary artery and normal left ventricular  function.  She also has peripheral vascular disease with failed  revascularization of her right iliac artery done percutaneously, for  which she subsequently had been treated with cilostazol.  This  medication was started in March 2007.  Your note from May 2007 is the  first description of tachycardia, and it had been an issue until you  gradually uptitrated her Toprol now to 75 mg a day.  She has been  largely unaware of it all along.   PAST MEDICAL HISTORY:  In addition to the above is notable for;  1. Hypertension.  2. Dyslipidemia.  3. Carotid disease.  4. Peripheral vascular disease.  5. Obesity.   MEDICATIONS INCLUDE:  1. Plavix 75.  2. Aspirin 325.  3. Lisinopril 40.  4. Lipitor 80.  5. Pletal 100 b.i.d.  6. Glimepiride 2.  7. Janumet 50/500.  8. Toprol 75 b.i.d.   ALLERGIES:  She has no known drug allergies.   SOCIAL HISTORY:  She continues to smoke.  She does not use alcohol or  recreational drugs.  She is here with her daughter today.   PHYSICAL EXAMINATION:  GENERAL:  She is an elderly Caucasian female,  appearing her stated age of 61.  VITAL SIGNS:  Her blood pressure was 140/78.  Her pulse was 77.  HEENT:  Demonstrated no icterus or xanthoma.  NECK:  Veins were flat.  The carotids were brisk and full bilaterally  without bruits.  BACK:  Without kyphosis or scoliosis.  LUNGS:  Clear.  HEART:  Sounds were regular without murmurs or gallops.  ABDOMEN:  Protuberant, but soft.  EXTREMITIES:  Femoral pulses were not examined.  Distal pulses were not  palpable.  There was  trace edema in the pretibial area.  She has got fat  ankles, which she states are not prominent before when she gets up in  the morning, but they do not really have a sense of feeling of edema.   Electrocardiogram dated today demonstrated sinus rhythm at 75 beats per  minute with intervals of 0.15/0.09/0.40.  The P-wave morphologies here  and on previous electrocardiograms were all the same except for one  where the P-wave was upright in V1, but that is an anomaly of lead  placement.   IMPRESSION:  1. Inappropriate sinus tachycardia likely related to cilostazol, which      is a phosphodiesterase inhibitor.  2. Remediation of the above with increasing doses of Toprol.  3. Coronary artery disease with prior percutaneous coronary      intervention stenting.  4. Peripheral vascular disease with failed revascularization.  5. Ongoing cigarette abuse.  6. Hypertension.  7. Obesity.   Theron Arista, [name]'s tachycardia appears to be, as you thought, initially  sinus tachycardia, for which we found no other primary cause.  Looking  back through the time sequence, I wonder whether this is not the  cilostazol.  I do not know that there is any reason to continue the  cilostazol.  If you think it is  helping her peripheral vascular disease,  the Toprol seems to be continuing to controlling her sinus tachycardia.   If there is anything further I can do, please do not hesitate to contact  me.    Sincerely,      Duke Salvia, MD, [facility]  Electronically Signed    SCK/MedQ  DD: 04/12/2008  DT: 04/13/2008  Job #: [id number]

## 2010-12-08 NOTE — Assessment & Plan Note (Signed)
[facility]                            CARDIOLOGY OFFICE NOTE   [name] [name]                         MRN:          [id number]  DATE:01/11/2007                            DOB:          04-15-1936    [name] [name] for followup.   She has had a previous inferior wall MI with angioplasty to the right  coronary artery in April 2006.  She does not have significant left sided  disease.  She has been compliant with her aspirin and Plavix.  She does  not complain of any significant angina.   She also has peripheral vascular disease with an occluded right iliac  that is collateralized.  She had a failed attempt of recannulization by  Dr. Samule Ohm.  She has had improvement with her walking on Pletal.  This  is stable.  She has not had any rest pain or ulcers.  The patient's risk  factors include hypercholesterolemia and hypertension.  She has been  compliant with her meds and in talking to the patient she seems to be  doing fairly well.  She is fairly sedentary, she likes to read a lot.  She says she has read 22 books in the last 3 months, mostly [name] [name].   Her review of systems otherwise negative.   MEDICATIONS:  1. Plavix 75 mg daily.  2. Aspirin daily.  3. Toprol 25 daily.  4. Lisinopril 40 daily.  5. Lipitor 80 daily.  6. Pletal 100 b.i.d.   EXAMINATION:  She is overweight.  She is relatively tachycardiac at 105.  Weight is up 6 pounds to 248.  Blood pressure is 136/90.  She is a chronically ill appearing middle aged female in no distress.  Respiratory rate is 16.  She is afebrile.  HEENT:  Normal.  She has a faint right carotid bruit.  JVP is not elevated. There is no  thyromegaly or lymphadenopathy.  LUNGS:  Clear with normal diaphragmatic motion.  No wheezing.  There is an S1, S2 with a soft systolic murmur. A PMI is not palpable.  Bowel sounds are positive.  There is no hepatosplenomegaly, no AAA, no  hepatojugular reflux,  no tenderness.  She has bilateral femoral bruits.  The right femoral is very difficult to palpate.  Distal pulses are  decreased.  There is no dependent rubor or elevation pallor.  There is  +1 edema bilateral.  NEURO:  Nonfocal.  There is no muscular weakness.   Her EKG [name] showed sinus tachycardia with no other abnormalities.   IMPRESSION:  Coronary artery disease.  No evidence of angina, status  post angioplasty of the right coronary artery.  Continue aspirin and  Plavix.  She had a Myoview study in January which was nonischemic with  normal left ventricular function.  1. Risk factor modification.  Hypercholesterolemia.  Check liver      profile [name].  Continue Lipitor 80 mg daily.  2. Hypertension.  Currently stable.  Continue Toprol and lisinopril,      low salt diet.  3. Peripheral vascular disease with stable claudication of  the right      lower extremity. The only way to improve circulation would be with      invasive surgery since she has failed to percutaneous transluminal      angioplasty.  The patient and I both think she is stable.  She will      follow up ankle-brachial indexes in 6 months.  4. Carotid artery disease.  Followup carotid duplex in July.      Currently no transient ischemic attack or cerebrovascular accident-      like symptoms.  5. Relative tachycardia, I am not sure why.  If this is the case, we      will increase her Toprol to 50 mg daily. She will have a CBC, CMET      and liver tests [name].  It is an asymptomatic tachycardia, but      given her coronary disease I think she should be on the higher dose      of beta blocker.  There has been no evidence of paroxysmal atrial      fibrillation or atrial tachycardia.   I will see her back in July when she has her followup ankle-brachial  indexes and carotid duplex.  She will let us know if she has any  significant palpitations or problems with her heart rate.     Noralyn Pick. Eden Emms, MD, [facility]   Electronically Signed    PCN/MedQ  DD: 01/11/2007  DT: 01/11/2007  Job #: [id number]

## 2010-12-11 NOTE — H&P (Signed)
[name], [name]                  ACCOUNT NO.:  [id number]   MEDICAL RECORD NO.:  [id number]          PATIENT TYPE:  OIB   LOCATION:  2899                         FACILITY:  [facility]   PHYSICIAN:  Salvadore Farber, M.D. LHCDATE OF BIRTH:  1936-06-17   DATE OF ADMISSION:  02/26/2005  DATE OF DISCHARGE:                                HISTORY & PHYSICAL   HISTORY OF PRESENT ILLNESS:  [name] [name] is a 75 year old lady with coronary  and peripheral vascular disease in the setting of hypertension,  dyslipidemia, family history of premature atherosclerotic disease and  recently ceased tobacco abuse.  She has substantially lifestyle limiting  claudication primarily of the right hip.  There is an occluded right common  iliac artery with a small nub at its origin and reconstitution just above  the takeoff of the internal iliac.  There is also a 50% stenosis at the  ostium of the left common iliac artery.  There is no disease beneath the  inguinal ligament on the other side.  Ankle brachial indexes are 0.5 on the  right and 0.96 on the left.   On Nov 24, 2004 I attempted percutaneous revascularization of the right  common iliac artery.  I was unable to cross the lesion with a wire,  attempting both antegrade and retrograde.  We discussed further options for  management including conservative management, femoral-femoral bypass  grafting, and repeat attempt at percutaneous revascularization.  The patient  has chosen attempt at repeated percutaneous revascularization and returns  today for that purpose.   PAST MEDICAL HISTORY:  1.  Coronary artery disease status post RCA stent February 2006.  2.  Status post appendectomy.  3.  Status post bladder surgery.  4.  History of inferior myocardial infarction treated with balloon      angioplasty in 1995.  5.  Hypertension.  6.  Dyslipidemia.   ALLERGIES:  NKDA.   CURRENT MEDICATIONS:  1.  Aspirin 325 mg per day.  2.  Plavix 75 mg per day.  3.   Toprol XL 25 mg per day.  4.  Lisinopril 20 mg per day.  5.  Lipitor 80 mg q.h.s.   SOCIAL HISTORY:  Patient is retired.  She lives in [place] and is  divorced.  She has nine children.  Gets basically no exercise.  Quit smoking  in April 2006.  Denies alcohol and illicit drug use.   FAMILY HISTORY:  Father died at 74 of stroke.  Mother died at 73 of stroke.  Six siblings.  One brother died at 105 of liver disease.  One brother had  CABG in his 61s.  A sister died of exfixiation at 64.  Two other sisters are  alive with diabetes.   REVIEW OF SYSTEMS:  Chronic edema in both ankles.  Wears glasses.  Otherwise  negative in detail except as above.   PHYSICAL EXAMINATION:  GENERAL:  She is generally well-appearing in no  distress.  VITAL SIGNS:  Heart rate 81, blood pressure 146/83, oxygen saturation of 95%  on room air, temperature of 97.3, weight of 233  pounds.  She is 5 feet 1  inch tall.  HEENT:  She has upper and lower dentures and is otherwise normocephalic.  LYMPH:  She has no cervical, supraclavicular, or axillary lymphadenopathy.  NECK:  She has no jugular venous distention and no thyromegaly.  LUNGS:  Respiratory effort is normal.  Clear to auscultation.  HEART:  She has a nondisplaced point of maximal cardiac impulse.  There is a  regular rate and rhythm without murmurs, rubs, or gallops.  ABDOMEN:  Obese, soft, nondistended, nontender.  I am unable to assess  hepatosplenomegaly due to her habitus.  There is no abdominal bruit heard.  EXTREMITIES:  Warm without clubbing, cyanosis, edema, or ulceration.  Carotid pulses are 2+ bilaterally without bruit.  Femoral pulse is absent on  the right and 2+ on the left.  Popliteal pulse is 1+ on the left and absent  on the right.  DP and PT pulses 2+ on the left and absent on the right.  MUSCULOSKELETAL:  Normal.  NEUROLOGIC:  Normal.   IMPRESSION/RECOMMENDATION:  Claudication.  Patient will undergo repeat  attempt at percutaneous  revascularization of the right common iliac artery.       WED/MEDQ  D:  02/26/2005  T:  02/26/2005  Job:  [id number]   cc:   Charlton Haws, M.D.

## 2010-12-11 NOTE — Op Note (Signed)
[name] [name], [name] [name]                  ACCOUNT NO.:  [id number]   MEDICAL RECORD NO.:  [id number]          PATIENT TYPE:  OIB   LOCATION:  2899                         FACILITY:  [facility]   PHYSICIAN:  Salvadore Farber, M.D. LHCDATE OF BIRTH:  Sep 18, 1935   DATE OF PROCEDURE:  11/24/2004  DATE OF DISCHARGE:                                 OPERATIVE REPORT   PROCEDURES:  1.  Abdominal aortography with bilateral lower extremity runoff.  2.  Unsuccessful angioplasty of the chronic total occlusion of the right      common iliac artery.   INDICATION:  Ms. Malachi is a 75 year old lady with coronary and peripheral  vascular disease in the setting of longstanding tobacco abuse, hypertension,  dyslipidemia and family history of premature atherosclerosis.  She has  longstanding right hip and calf pain occurring primarily with exertion.  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  ABI was 0.50 on the right and 0.96 on  the left.  She presents today for planned abdominal aortography with lower  extremity runoff and attempt at percutaneous revascularization of the right  common iliac artery.   PROCEDURAL TECHNIQUE:  Informed consent was obtained.  Under 1% lidocaine  local anesthesia, a 5 French sheath was placed in the left common femoral  artery using the modified Seldinger technique.  A pigtail catheter was  advanced to the infrarenal abdominal aorta.  Abdominal aortography with  bilateral lower extremity runoff to the feet was performed by power  injection using digital subtraction and step table technique.  This  confirmed previous finding of chronic total occlusion of the right common  iliac artery.  Plan was made for attempted percutaneous revascularization.   Using fluoroscopic and Smart needle guidance, a 5 French sheath was placed  in the right  common artery using the modified Seldinger technique.  Anticoagulation was initiated with 5000 units of heparin.  Attempt was first  made to cross the total occlusion in a retrograde fashion from the right  groin access.  Glide wire advanced through the level of the occlusion with  minimal difficulty, but I was unable to gain access to the true lumen of the  distal aorta.  Repeat angiography demonstrated no dye staining.  Despite  switching to a stiff angled glide, I was unable to gain access.  Therefore,  I switched to an antegrade approach.  Using left internal mammary artery  catheter, as well as Chuang catheters, I was unable to advance either the  regular or the stiff angled glide wires across the proximal cap.  Finally, I  returned to a retrograde approach.  Using similar technique, I remained  unable to gain access to the true lumen.  The procedure was therefore  aborted.  Repeat angiography of the abdomen and pelvis demonstrated no  change with no dye hang up in the wall, no change in the lumbar collateral  and no change in the left common  iliac stenosis.  The patient was then  transferred to the holding room in stable condition.  Sheaths are to be  removed there when the ACT is less than 175 seconds.   COMPLICATIONS:  None.   FINDINGS:  1.  Abdominal aorta:  Diffusely diseased vessel without evidence of      aneurysm.  There is up to 40% stenosis distally.  2.  Right leg:  Chronic total occlusion of the right common iliac.  There is      a small nub at its origin.  Reconstitution is via the internal, which      then supplies the external.  There is the external, common femoral,      profunda, SFA and popliteal are all normal.  There is no disease below      the knee with three-vessel runoff to the foot.  3.  Left leg:  50% stenosis of the ostium of the left common iliac artery.      The remainder of the common iliac is normal as is the external and iliac      arteries.  The  common femoral, profunda, SFA and popliteal are all      normal.  There is three-vessel runoff to the foot via normal PT,      perineal and anterior tibial vessels.   IMPRESSION/RECOMMENDATION:  Unsuccessful attempt at angioplasty of the  chronic total occlusion of the right common iliac artery.  Will discuss  further management options, including conservative therapy versus a repeat  attempt at percutaneous revascularization.      WED/MEDQ  D:  11/24/2004  T:  11/24/2004  Job:  [id number]   cc:   Charlton Haws, M.D.   Delaney Meigs, M.D.  [address]  [place]  [place] [address]  Fax: [phone]

## 2010-12-11 NOTE — Assessment & Plan Note (Signed)
[facility]                            CARDIOLOGY OFFICE NOTE   [name], [name]                         MRN:          [id number]  DATE:07/05/2006                            DOB:          [birth year]    [name] [name] returns today for followup.  She had an inferior wall  infarction with angioplasty of her right coronary artery in April 2006.   She did not have critical left-sided disease.  She has been on aspirin  and Plavix since then.  She denies any significant chest pain.  Coronary  risk factors include hyperlipidemia and hypertension.  Her blood  pressure is well controlled on Toprol and lisinopril.  She is on Lipitor  80 mg a day.   The patient needs followup LFTs in the next 6 months.   The patient also has significant peripheral vascular disease.   She has a right common iliac occlusion.  Dr. Samule Ohm has tried to  angioplasty this twice and has failed and if she needs  revascularization, it will need to be surgical.  As I recall, her ABIs  are in the 0.56 range on the right.   REVIEW OF SYSTEMS:  Remarkable for no significant chest pain.  She has  not had any easy bruising.  She has been on Pletal and actually walks  quite well and does not complain of claudication, particularly in the  right lower extremity.  She has not had any nonhealing ulcers.   She has been compliant with her medications.   She now has both her daughters living with her.  One has been separated  from her husband for over 30 years and the other one had some drug  issues and needed to live with her mom to help straighten her life out.   MEDICATIONS:  1. Plavix 75 a day.  2. Aspirin a day.  3. Toprol 25 a day.  4. Lisinopril 40 a day.  5. Lipitor 80 a day.  6. Pletal 100 b.i.d.   She has nitroglycerine if she needs it.   On exam, she is overweight.  Her blood pressure is 130/80 in each arm.  Pulse is 88 and regular.  HEENT:  Normal.  There is no  lymphadenopathy.  No thyromegaly.  Carotids  have a left carotid bruit.  LUNGS:  Clear.  HEART:  Normal S1, S2, normal heart sounds.  ABDOMEN:  Benign.  LOWER EXTREMITIES:  Some dependent rubor.  I cannot feel PT and DP on  the right.  She has palpable DP and PT that are +2 on the left.  NEURO:  Nonfocal.  SKIN;  Warm and dry.   IMPRESSION:  [name] [name] is a vasculopath.  She has had a previous  inferior wall MI with stent in the right coronary artery.  We will  continue her aspirin and Plavix.  Would like to do a Adenosine Myoview  study on her.  She is not as active as she needs to be and I would like  to rule out any early restenosis.   Her risk factors are well  modified and she will continue her Lipitor at  80 a day and her blood pressure is well controlled on Toprol and  lisinopril.   She has a left carotid bruit and we will have to go back and look  through the chart, but I do not see any recent carotid studies.  Given  her known cardiac and vascular disease, she needs a Duplex to rule out  significant carotid stenosis.   In regards to her claudication, she is doing well.  She does not have  much symptoms.  We will continue her Pletal at 100 b.i.d.  So long as  she is not having symptoms, she can have followup ABIs in a year.   The patient needs to lose weight.  We talked about her diet a bit.  Unfortunately, she eats out quite a bit and has a lot of snacks.  She  will try to curb this back but I suspect it will be difficult during the  holidays.  I will see her back in about 6 months as long as her Myoview  and carotid Duplex are not high risk.     Noralyn Pick. Eden Emms, MD, [facility]  Electronically Signed    PCN/MedQ  DD: 07/05/2006  DT: 07/05/2006  Job #: [id number]

## 2010-12-11 NOTE — H&P (Signed)
[name], [name]                  ACCOUNT NO.:  [id number]   MEDICAL RECORD NO.:  [id number]          PATIENT TYPE:  EMS   LOCATION:  MAJO                         FACILITY:  [facility]   PHYSICIAN:  Charlton Haws, M.D.     DATE OF BIRTH:  May 27, 1936   DATE OF ADMISSION:  09/16/2004  DATE OF DISCHARGE:                                HISTORY & PHYSICAL   REASON FOR ADMISSION:  [name] [name] is a 75 year old female with reported  history of coronary artery disease, status post previous myocardial  infarction in 1995, treated with primary balloon angioplasty by Dr. Donia Guiles, who has had no subsequent cardiac followup.  She now presents to  the emergency room from Dr. Joyce Copa office via EMS for evaluation of  symptoms worrisome for unstable angina pectoris.  The patient reports having  done well until approximately one week ago when she began to experience  intermittent chest tightness.  This progressed to her worst episode this  past Saturday evening when, while watching TV after having played some  cards, she experienced dull anterior chest pressure (7/10) associated with  dyspnea, diaphoresis, and nausea but no vomiting.  The episode lasted  approximately 30 minutes.  The patient did not take any nitroglycerin.  Since then, she has had recurrent mild, intermittent chest tightness. She  states that her episode Saturday night was reminiscent of, but not as  intense as, her MI presentation in 1995.   ALLERGIES:  No known drug allergies.   CURRENT MEDICATIONS:  1.  Aspirin 81 daily.  2.  Toprol XL 25 daily.  3.  Lisinopril 20 daily.   PAST MEDICAL HISTORY:  1.  Coronary artery disease (as described above).  2.  Hypertension.  3.  Dyslipidemia.  4.  Obesity.  5.  Status post bladder tack.   SOCIAL HISTORY:  The patient lives in [place].  She continues to smoke on  average one-half pack a day, and has been doing so since the age of 49.  Denies alcohol use.  The patient is not  very active.   FAMILY HISTORY:  Two brothers, in their early 19s, one of whom had bypass  surgery and the other had known peripheral vascular disease.   REVIEW OF SYSTEMS:  As noted per HPI.  Has chronic orthopnea and exertional  dyspnea with occasional mild edema.  Denies PND.  Has bilateral hip and  buttock claudication (left greater than right).  Has occasional heartburn,  dissimilar from her current symptoms.  Denies any recent evidence of upper  or lower GI bleeding.  Remaining systems negative.   PHYSICAL EXAMINATION:  VITAL SIGNS:  Blood pressure 135/52, pulse 53,  regular respirations at 20, temperature 98.2, O2 saturation 95% on room air.  GENERAL:  A 75 year old female in no apparent distress.  HEENT:  Normocephalic and atraumatic.  NECK:  Audible carotid pulses without bruits.  LUNGS:  Diminished breath sounds on the right but without crackles or  wheezes.  HEART:  Regular rate and rhythm.  S1, S2.  No murmurs, rubs, or gallops.  ABDOMEN:  Protuberant,  nontender with intact bowel sounds.  EXTREMITIES:  Non-palpable right femoral pulse; palpable left femoral pulse  with soft bruit; palpable right dorsalis pedis pulse but non-palpable left  dorsalis pedis pulse; 1 to 2+ lower extremity edema with no significant  pedal edema.  NEUROLOGIC:  No focal deficit.   LABORATORY DATA AND OTHER STUDIES:  Chest x-ray:  Pending,.   Electrocardiogram:  Sinus bradycardia at 56 beats per minute with normal  axis; no ischemic changes.   Laboratory data:  Pending.   IMPRESSION:  1.  Unstable angina pectoris.  2.  Coronary artery disease, status post myocardial infarction with      percutaneous transluminal cardiac angioplasty in 1995.  3.  Tobacco, ongoing.  4.  Dyslipidemia.  5.  Hypertension.  6.  Claudication, probable peripheral vascular disease.  7.  Sinus bradycardia.   PLAN:  The patient will be admitted to telemetry for further evaluation and  rule out of myocardial  infarction.  Her symptoms are worrisome for unstable  angina pectoris.  We, therefore, recommend proceeding with cardiac  catheterization.  Owing to the schedule, we will not be able to proceed  today but will schedule her for early tomorrow morning.  She is agreeable  with this plan.  The risks and benefits have been discussed.  We will  continue aspirin, Toprol, and ACE inhibitor and consider adding a diuretic  (i.e. Hyzaar) after catheterization for treatment of her chronic lower  extremity edema.  Additionally, the patient will be started on intravenous  nitroglycerin and heparin here in the emergency room.  We will also  recommend distal aortogram at time of the cardiac catheterization for  exclusion of significant peripheral vascular disease.      GS/MEDQ  D:  09/16/2004  T:  09/16/2004  Job:  [id number]   cc:   Delaney Meigs, M.D.  [address]  [place]  [place] [address]  Fax: [phone]

## 2010-12-11 NOTE — Discharge Summary (Signed)
[name] [name], [name] [name]                  ACCOUNT NO.:  [id number]   MEDICAL RECORD NO.:  [id number]          PATIENT TYPE:  OIB   LOCATION:  6524                         FACILITY:  [facility]   PHYSICIAN:  Doylene Canning. Ladona Ridgel, M.D.  DATE OF BIRTH:  23-Feb-1936   DATE OF ADMISSION:  02/26/2005  DATE OF DISCHARGE:  02/27/2005                           DISCHARGE SUMMARY - REFERRING   HISTORY OF PRESENT ILLNESS:  [name] [name] is a 75 year old white female with  known peripheral vascular disease in claudication.  Dr. Samule Ohm attempted  revascularization of the right iliac artery on Nov 24, 2004; however, this  was unsuccessful.  She returned to the office on Dec 14, 2004 for further  evaluation and the patient wished to undergo another attempt at  revascularization.   PAST MEDICAL HISTORY:  1.  Notable for hypertension.  2.  Dyslipidemia.  3.  Obesity.  4.  Early family history of coronary artery disease.  5.  Recent tobacco cessation.  ABIs on the right 0.5 and 0.96 on the left.   LABORATORY DATA:  Preadmission labs show an H&H of 11.9 and 36.5, normal  indices, platelets 71, WBC 5.4.  PTT 28.6, PT 12.  Sodium 141, potassium  4.1, BUN 15, creatinine 0.8, glucose 129.   EKG from the office shows normal sinus rhythm, normal axis, nonspecific ST-T-  wave changes.  Chest x-ray on August 2 does not show any acute processes.  Prior to discharge H&H was 10.7 and 32, normal indices, platelets 93, WBC  9.6.  Sodium 139,  potassium 3.9, BUN 20, creatinine 1.2, glucose 130.   HOSPITAL COURSE:  Dr. Samule Ohm performed attempted angioplasty to the right  common iliac artery; however, again he was unable to cross the lesion.  The  case was performed from the left brachial.  Post procedure she developed a  left brachial hematoma while short stayed.  Thus he admitted her overnight  for observation on February 27, 2005.  Her hematoma was still present; however,  according to nursing staff and the patient staff the hematoma  was much  smaller and certainly softer than post procedure.  She had good distal  pulses.  Dr. Ladona Ridgel after review felt that the patient could be discharged  home.   DISCHARGE DIAGNOSES:  1.  Claudication.  2.  Peripheral vascular disease as previously described.  3.  Second failed attempt at revascularization percutaneously, history as      previously.   DISPOSITION:  She is discharged home.  Restrictions per discharge  instructions after catheterization.   DIET:  Maintain low salt, fat, and cholesterol diet.   MEDICATIONS:  1.  Aspirin 325 daily.  2.  Plavix 75 daily.  3.  Toprol XL 25 daily.  4.  Lisinopril 20 daily.  5.  Lipitor 80 q.h.s.  6.  Nitroglycerin 0.4 as needed.   FOLLOW UP:  She was advised to call Dr. Eden Emms and Dr. Melinda Crutch office on  Monday to arrange follow-up appointment.  She was encouraged to continue  smoking cessation.      [name]   EW/MEDQ  D:  02/27/2005  T:  02/27/2005  Job:  [id number]   cc:   Charlton Haws, M.D.   Salvadore Farber, M.D. [facility]  [address]  [address]  [place]  [place] [address]   Delaney Meigs, M.D.  [address]  [place]  [place] [address]  Fax: [phone]

## 2010-12-11 NOTE — Op Note (Signed)
NAMEALONNAH, LAMPKINS                  ACCOUNT NO.:  [id number]   MEDICAL RECORD NO.:  [id number]          PATIENT TYPE:  OIB   LOCATION:  6524                         FACILITY:  [facility]   PHYSICIAN:  Salvadore Farber, M.D. LHCDATE OF BIRTH:  [birth year]   DATE OF PROCEDURE:  02/26/2005  DATE OF DISCHARGE:                                 OPERATIVE REPORT   PROCEDURE:  Unsuccessful right common iliac artery angioplasty.   INDICATIONS:  Ms. Goel is a 75 year old lady with longstanding claudication  of the right leg with occlusion of the entirety of the right common iliac  artery.  On May 2 I attempted revascularization percutaneously.  This was  unsuccessful.  She returned today for repeat attempt.  The ABI on that side  is 0.5.  She has not had any rest pain or ulceration.   PROCEDURAL TECHNIQUE:  Informed consent was obtained.  Under 1% lidocaine  local anesthesia, a 6 French sheath was placed in the right common femoral  artery using a Smart needle.  A 5 French sheath was placed in the right  common femoral vein.  I advanced an angled Glidewire across the lesion but  was unable to reenter the aortic lumen.  I attempted then with a stiff  angled Glide using the same.  I then took further images, via a pigtail  advanced via the left common femoral artery and positioned in the infrarenal  abdominal aorta.  Angiography was performed by hand injection.  There was no  compromise in flow to the left.  I therefore decided to attempt wiring via a  left brachial approach.   Under 1% lidocaine local anesthesia, a 5 French sheath was placed in the  left brachial artery using a Smart needle.  A multipurpose catheter was then  advanced over a Wholey wire into the infrarenal abdominal aorta.  I  attempted to advance a stiff angled Glidewire but was unable to penetrate  the cap of the lesion.   I then made one more attempt below again without success.  Repeat  angiography was performed using the  pigtail catheter, demonstrating  persistence of the collaterals and no dye staining within the wall.  The  patient tolerated this procedure well and was transferred to the holding  room in stable condition.   COMPLICATIONS:  None.   IMPRESSION/RECOMMENDATIONS:  Unsuccessful attempt at angioplasty of chronic  total occlusion of the right common femoral artery.  Will consider surgical  revascularization.  However, this will have to wait at least until February  2007, given drug-eluting stent placed in her coronary in February 2006.       WED/MEDQ  D:  02/26/2005  T:  02/27/2005  Job:  [id number]   cc:   Charlton Haws, M.D.   Delaney Meigs, M.D.  [address]  [place]  [place] [address]  Fax: [phone]

## 2010-12-11 NOTE — Discharge Summary (Signed)
[name], [name]                  ACCOUNT NO.:  [id number]   MEDICAL RECORD NO.:  [id number]          PATIENT TYPE:  OIB   LOCATION:  6524                         FACILITY:  [facility]   PHYSICIAN:  Doylene Canning. Ladona Ridgel, M.D.  DATE OF BIRTH:  [birth year]   DATE OF ADMISSION:  02/26/2005  DATE OF DISCHARGE:  02/27/2005                           DISCHARGE SUMMARY - REFERRING   ADDENDUM:  Ms. Back at the time of discharge states that she is not on Toprol-XL 25 mg  and she has never been on this medication. According to review of Dr.  Melinda Crutch office note, office medication list, and office medications, the  patient has been taking Toprol-XL 25 mg. I have written a prescription at  the time of discharge for Toprol-XL 25 mg and I have asked her to continue  to take this. I have also asked her to make sure that she brings all  medications to the office for thorough review.      [name]   EW/MEDQ  D:  02/27/2005  T:  02/27/2005  Job:  [id number]

## 2010-12-11 NOTE — Discharge Summary (Signed)
NAMEBRIDGID, PRINTZ                  ACCOUNT NO.:  [id number]   MEDICAL RECORD NO.:  [id number]          PATIENT TYPE:  INP   LOCATION:  6529                         FACILITY:  [facility]   PHYSICIAN:  Charlton Haws, M.D.     DATE OF BIRTH:  Apr 28, 1936   DATE OF ADMISSION:  09/16/2004  DATE OF DISCHARGE:  09/18/2004                           DISCHARGE SUMMARY - REFERRING   PROCEDURES:  Coronary angiogram/stenting (bare metal) right coronary artery  on September 17, 2004.   REASON FOR ADMISSION:  Ms. Gest is a 75 year old female, with reported  history of a previous myocardial infarction treated with primary balloon  angioplasty in 1995, with cardiac risk factors notable for longstanding  tobacco smoking, hypertension, hypercholesterolemia, and family history of  coronary artery disease.  She presented to the emergency room, from Dr.  Joyce Copa office, with symptoms worrisome for unstable angina pectoris.  Please refer to dictated admission note for details.   LABORATORY DATA:  Cardiac markers normal.  Lipid profile:  Total cholesterol  187, triglyceride 208, HDL 36, LDL 109 (cholesterol/HDL ratio of 5.2).  TSH  3.63.  Hematocrit 33, platelets 123,000 at discharge, platelet low of  118,000 (HIT panel negative).  Electrolytes and renal function normal.   HOSPITAL COURSE:  The patient ruled out for myocardial infarction with all  serial cardiac markers within normal limits.   Cardiac catheterization, performed the following day by Dr. Randa Evens  (see report for details), revealed essentially severe single-vessel coronary  artery disease with 95% proximal right coronary artery stenosis, followed by  a 60% lesion distally.  Left ventricular function was normal with no  evidence of wall motion abnormality.   Dr. Samule Ohm proceeded with successful stenting (bare metal) of the 95% RCA  lesion, to 0% residual stenosis; no noted complications.   Distal aortogram revealed patent bilateral renal  arteries; 70% left common  iliac artery stenosis with 100% right common iliac artery stenosis.   RECOMMENDATIONS:  Treatment with Plavix at least for one year.   The patient was also referred for smoking cessation consultation, and will  be started on Zyban and nicotine patch at discharge.   Mild thrombocytopenia noted - HIT panel negative.   MEDICATION ADJUSTMENTS THIS ADMISSION:  Addition of Plavix, Lipitor, up-  titration of lisinopril, and addition of Zyban and Nicoderm patch.   MEDICATIONS AT DISCHARGE:  1.  Plavix 75 mg daily (at least one year).  2.  Coated aspirin 325 mg daily.  3.  Toprol-XL 25 mg daily.  4.  Lisinopril 40 mg daily.  5.  Lipitor 80 mg daily.  6.  Zyban 140 mg daily (x3 days), then 1 tablet b.i.d.  7.  Nicoderm patch 21 mg as directed.  8.  Nitrostat 0.4 mg as directed.   INSTRUCTIONS:  Refrain from any strenuous activities or exertion x2 days;  maintain a low-fat, low-cholesterol diet; call the office if there is any  swelling/bleeding in the groin.   The patient is strongly urged to discontinue smoking.   FOLLOWUP:  Followup with Dr. Charlton Haws on September 29, 2004 at 11:45 a.m.  DISCHARGE DIAGNOSES:  1.  Unstable angina pectoris.      1.  Normal serial cardiac enzymes.      2.  Status post stent (bare metal) 95% proximal right coronary artery          September 17, 2004.      3.  Residual 60% distal RCA.      4.  Normal left ventricular function.  2.  Peripheral vascular disease.      1.  100% right CIA; 70% less CIA.  3.  Tobacco.  4.  Dyslipidemia.  5.  Hypertension.  6.  Mild thrombocytopenia.      GS/MEDQ  D:  09/18/2004  T:  09/18/2004  Job:  [id number]   cc:   Delaney Meigs, M.D.  [address]  [place]  [place] [address]  Fax: [phone]

## 2010-12-11 NOTE — Cardiovascular Report (Signed)
NAMESHENEKIA, RIESS                  ACCOUNT NO.:  [id number]   MEDICAL RECORD NO.:  [id number]          PATIENT TYPE:  INP   LOCATION:  4735                         FACILITY:  [facility]   PHYSICIAN:  Salvadore Farber, M.D. LHCDATE OF BIRTH:  [birth year]   DATE OF PROCEDURE:  09/17/2004  DATE OF DISCHARGE:                              CARDIAC CATHETERIZATION   PROCEDURE:  Left heart catheterization, coronary angiography, left  ventriculography, bare metal stenting of the proximal right coronary artery.   INDICATIONS:  Mrs. [name] [name] is a 76 year old lady,  status post prior balloon  angioplasty on an unknown vessel.  Those records are not available to [place].  She now presents with unstable angina.  She was admitted to the hospital and  ruled out for myocardial infarction.  She was referred for diagnostic  angiography after being stabilized on aspirin, heparin, beta blocker, and  statin.   DIAGNOSTIC TECHNIQUE:  Informed consent was obtained.  Under 1% lidocaine  local anesthesia, a 6-French sheath was placed in the left common femoral  artery using the modified Seldinger technique.  No attempt was made to use  the right leg because of poor pulse there.  Diagnostic angiography and  ventriculography were performed using JL-3, JL-4, and pigtail catheters.  The pigtail catheter was then pulled back into the infrarenal abdominal  aorta.  Abdominal aortography with imaging to the level of the external  iliacs bilaterally was then performed.   These images demonstrated the culprit lesion to be a 95% stenosis of the  proximal RCA.  Decision was made to do percutaneous revascularization.  Anticoagulation was given with additional heparin to achieve and maintain an  ACT of greater than 200 seconds.  Double bolus eptifibatide was  administered, as was 300 mg of Plavix.  A 6-French JL-4 guide was advanced  over the wire and engaged in the ostium of the RCA.  A Prowater wire was  advanced across the lesion  without difficulty.  The lesion was predilated  using a 3 x 12 mm Maverick.  It was then stented using a 5 x 18 mm Ultra at  14 atmospheres.  Finally, angiography demonstrated no residual stenosis, no  dissection and TIMI-3 flow to the distal vasculature.  The patient tolerated  the procedure well and was transferred to the holding room in stable  condition.   COMPLICATIONS:  None.   DISSECTION FINDINGS:  1.  Left ventricular:  141/8/14.  Ejection fraction 65% without regional      wall motion abnormality.  2.  No aortic stenosis or mitral regurgitation.  3.  Left main:  There is no left main vessel.  The circumflex arises      anomously from the proximal RCA.  4.  LAD:  Moderate size vessel giving rise to a single diagonal branch.      There is a 505 stenosis of the ostium of the vessel.  There was no      dampening of a 6-French catheter when it was clearly engaged beyond the      stenosis.  There was then a 30%  stenosis of the vessel just at the take-      off of the diagonal.  5.  Circumflex:  Vessel arises from the proximal right coronary artery and      courses posteriorly.  It is relatively small with filmy mild diffuse      disease, 40% ostial stenosis.  6.  RCA:  Extremely large dominant vessel supplying the inferior wall and      the great bulk of the lateral wall.  There was a 95% stenosis      proximally.  There has been a 60% stenosis in the mid vessel.  Through      this segment, the vessel tapers.  This more distal stenosis is 50-60%      relative to the vessel on its downstream side, but high-degree of      stenosis proximally.  I do not think it is hemodynamically significant.  7.  Abdominal aortography:  There is single renal arteries bilaterally.      Neither of these have significant stenoses. There is mild disease on the      right.  The infrarenal abdominal aorta is diffusely diseased without      stenosis.  There is a 70% stenosis of the left common iliac.  The  right      common iliac is totally occluded from its origin through to the take-off      with the internal iliac.  Distal vessel was well collateralized via both      lumbar and pelvic collaterals.   IMPRESSION/RECOMMENDATIONS:  Successful placement of bare-metal stent in the  proximal right coronary artery with excellent angiographic result.  She  should be continued on Plavix for at least a year and aspirin should be  continued indefinitely.  Statin, beta blocker and ACE inhibitor have been  initiated during this hospitalization and should be continued.  Smoking  cessation was strongly advised.      WED/MEDQ  D:  09/17/2004  T:  09/18/2004  Job:  [id number]   cc:   Delaney Meigs, M.D.  [address]  [place]  [place] [address]  Fax: [phone]   Charlton Haws, M.D.

## 2011-03-08 ENCOUNTER — Encounter: Payer: Self-pay | Admitting: Cardiovascular Disease

## 2011-03-15 ENCOUNTER — Encounter: Payer: Self-pay | Admitting: Cardiovascular Disease

## 2011-03-16 ENCOUNTER — Encounter: Payer: Self-pay | Admitting: *Deleted

## 2011-04-02 ENCOUNTER — Other Ambulatory Visit: Payer: Self-pay | Admitting: Cardiovascular Disease

## 2011-04-02 DIAGNOSIS — I6529 Occlusion and stenosis of unspecified carotid artery: Secondary | ICD-10-CM

## 2011-04-05 ENCOUNTER — Encounter: Payer: Self-pay | Admitting: Cardiovascular Disease

## 2011-04-05 ENCOUNTER — Encounter (INDEPENDENT_AMBULATORY_CARE_PROVIDER_SITE_OTHER): Payer: Medicare Other | Admitting: *Deleted

## 2011-04-05 ENCOUNTER — Ambulatory Visit (INDEPENDENT_AMBULATORY_CARE_PROVIDER_SITE_OTHER): Payer: Medicare Other | Admitting: Cardiovascular Disease

## 2011-04-05 VITALS — BP 172/77 | HR 72 | Resp 18 | Ht 61.0 in | Wt 230.0 lb

## 2011-04-05 DIAGNOSIS — I739 Peripheral vascular disease, unspecified: Secondary | ICD-10-CM

## 2011-04-05 DIAGNOSIS — I6529 Occlusion and stenosis of unspecified carotid artery: Secondary | ICD-10-CM

## 2011-04-05 NOTE — Assessment & Plan Note (Signed)
Cut back to 1 carton per month from 2  No motivation to quit totally.  Counseled for less than 10 minutes

## 2011-04-05 NOTE — Progress Notes (Signed)
[name] [name] returns today for followup. She has had previous stenting of the right coronary artery. She has had resting tachycardia. We started her on beta-blockers and she improved. We did not find a good reason forher tachycardia, although she is diabetic and may have some autonomic dysfunction. She has good LV function. She has bilateral carotid disease. I reviewed duplex from June and was concerned since her systolic velocities were greater than 33m/sec and there was lots of calcium These were at the high end. F/U CTA also reviewed and indicated less than 80% disease and Dr Edilia Bo . I spoke with him personally about the decidsion making process in this case There have been no TIA like symptoms. Duplex today was stable with bilateral 60-79% stenosis.  F/U duplex then March 2013  She has smoked since age 40. She is not motivated to quit. I counseled her for less than 10 minutes She had previously tried patches 2 years agao with limited success and they were too expensive. She does not want to try Chantix or Welbutrin. Her BS has been reasonably controlled despite a poor diet.   Not taking pletal due to pharmacy mix up.  Also not taking ASA  Told her to take 81mg .  Will get lab work from Dr Tania Ade   ROS: Denies fever, malais, weight loss, blurry vision, decreased visual acuity, cough, sputum, SOB, hemoptysis, pleuritic pain, palpitaitons, heartburn, abdominal pain, melena, lower extremity edema, claudication, or rash.  All other systems reviewed and negative  General: Affect appropriate Healthy:  appears stated age HEENT: normal Neck supple with no adenopathy JVP normal left  bruits no thyromegaly Lungs clear with no wheezing and good diaphragmatic motion Heart:  S1/S2 no murmur,rub, gallop or click PMI normal Abdomen: benighn, BS positve, no tenderness, no AAA no bruit.  No HSM or HJR Distal pulses decreased left greater than right No edema Neuro non-focal Skin warm and dry No muscular  weakness   Current Outpatient Prescriptions  Medication Sig Dispense Refill  . atorvastatin (LIPITOR) 80 MG tablet Take 80 mg by mouth daily.        . clopidogrel (PLAVIX) 75 MG tablet Take 75 mg by mouth daily.        Marland Kitchen glimepiride (AMARYL) 2 MG tablet Take 2 mg by mouth daily before breakfast.        . lisinopril-hydrochlorothiazide (PRINZIDE,ZESTORETIC) 20-12.5 MG per tablet Take 1 tablet by mouth daily.  30 tablet  9  . metoprolol (TOPROL-XL) 50 MG 24 hr tablet Take 75 mg by mouth 2 (two) times daily.        . nitroGLYCERIN (NITROSTAT) 0.4 MG SL tablet Place 0.4 mg under the tongue every 5 (five) minutes as needed.        . sitaGLIPtan-metformin (JANUMET) 50-500 MG per tablet Take 1 tablet by mouth 2 (two) times daily with a meal.          Allergies  Review of patient's allergies indicates no known allergies.  Electrocardiogram:  Assessment and Plan

## 2011-04-05 NOTE — Assessment & Plan Note (Signed)
Well controlled.  Continue current medications and low sodium Dash type diet.    

## 2011-04-05 NOTE — Patient Instructions (Signed)
Your physician recommends that you schedule a follow-up appointment in: 6 MONTHS WITH DR Eden Emms AND CAROTID SAME DAY Your physician recommends that you continue on your current medications as directed. Please refer to the Current Medication list given to you today.  Your physician has requested that you have a carotid duplex. This test is an ultrasound of the carotid arteries in your neck. It looks at blood flow through these arteries that supply the brain with blood. Allow one hour for this exam. There are no restrictions or special instructions. 6 MONTHS WEE DR Eden Emms SAME DAY

## 2011-04-05 NOTE — Assessment & Plan Note (Signed)
F/U duplex in 6 months  ASA and Plavix

## 2011-04-05 NOTE — Assessment & Plan Note (Signed)
Stable with no angina and good activity level.  Continue medical Rx  

## 2011-04-05 NOTE — Assessment & Plan Note (Signed)
Cholesterol is at goal.  Continue current dose of statin and diet Rx.  No myalgias or side effects.  F/U  LFT's in 6 months. No results found for this basename: LDLCALC   Will get labs from [facility] drawn last month

## 2011-04-14 ENCOUNTER — Other Ambulatory Visit: Payer: Self-pay

## 2011-04-14 MED ORDER — METOPROLOL SUCCINATE ER 50 MG PO TB24: 75.0000 mg | ORAL_TABLET | Freq: Two times a day (BID) | ORAL | Status: DC

## 2011-05-13 ENCOUNTER — Other Ambulatory Visit: Payer: Self-pay

## 2011-05-13 MED ORDER — ATORVASTATIN CALCIUM 80 MG PO TABS: 80.0000 mg | ORAL_TABLET | Freq: Every day | ORAL | Status: DC

## 2011-05-13 MED ORDER — CLOPIDOGREL BISULFATE 75 MG PO TABS: 75.0000 mg | ORAL_TABLET | Freq: Every day | ORAL | Status: DC

## 2011-05-13 NOTE — Telephone Encounter (Signed)
.   Requested Prescriptions   Pending Prescriptions Disp Refills  . atorvastatin (LIPITOR) 80 MG tablet 30 tablet 6    Sig: Take 1 tablet (80 mg total) by mouth daily.   LOV:04/05/11, f/u in 6 mths. E-scribe sent to [facility],.

## 2011-05-13 NOTE — Telephone Encounter (Signed)
..   Requested Prescriptions   Signed Prescriptions Disp Refills  . clopidogrel (PLAVIX) 75 MG tablet 30 tablet 6    Sig: Take 1 tablet (75 mg total) by mouth daily.    Authorizing Provider: Wendall Stade    Ordering User: Lacie Scotts   E-scribe to [facility].

## 2011-08-27 DIAGNOSIS — E559 Vitamin D deficiency, unspecified: Secondary | ICD-10-CM | POA: Diagnosis not present

## 2011-08-27 DIAGNOSIS — I10 Essential (primary) hypertension: Secondary | ICD-10-CM | POA: Diagnosis not present

## 2011-08-27 DIAGNOSIS — R5383 Other fatigue: Secondary | ICD-10-CM | POA: Diagnosis not present

## 2011-08-27 DIAGNOSIS — R5381 Other malaise: Secondary | ICD-10-CM | POA: Diagnosis not present

## 2011-08-27 DIAGNOSIS — E785 Hyperlipidemia, unspecified: Secondary | ICD-10-CM | POA: Diagnosis not present

## 2011-08-27 DIAGNOSIS — E119 Type 2 diabetes mellitus without complications: Secondary | ICD-10-CM | POA: Diagnosis not present

## 2011-09-09 DIAGNOSIS — H2589 Other age-related cataract: Secondary | ICD-10-CM | POA: Diagnosis not present

## 2011-09-09 DIAGNOSIS — E1139 Type 2 diabetes mellitus with other diabetic ophthalmic complication: Secondary | ICD-10-CM | POA: Diagnosis not present

## 2011-09-09 DIAGNOSIS — E11329 Type 2 diabetes mellitus with mild nonproliferative diabetic retinopathy without macular edema: Secondary | ICD-10-CM | POA: Diagnosis not present

## 2011-09-09 DIAGNOSIS — D3130 Benign neoplasm of unspecified choroid: Secondary | ICD-10-CM | POA: Diagnosis not present

## 2011-10-01 ENCOUNTER — Encounter: Payer: Self-pay | Admitting: *Deleted

## 2011-10-04 ENCOUNTER — Ambulatory Visit: Payer: Medicare Other | Admitting: Cardiovascular Disease

## 2011-10-04 ENCOUNTER — Encounter: Payer: Self-pay | Admitting: Cardiovascular Disease

## 2011-10-04 ENCOUNTER — Encounter (INDEPENDENT_AMBULATORY_CARE_PROVIDER_SITE_OTHER): Payer: Medicare Other

## 2011-10-04 ENCOUNTER — Ambulatory Visit (INDEPENDENT_AMBULATORY_CARE_PROVIDER_SITE_OTHER): Payer: Medicare Other | Admitting: Cardiovascular Disease

## 2011-10-04 VITALS — BP 136/64 | HR 79 | Wt 235.8 lb

## 2011-10-04 DIAGNOSIS — I2510 Atherosclerotic heart disease of native coronary artery without angina pectoris: Secondary | ICD-10-CM

## 2011-10-04 DIAGNOSIS — I739 Peripheral vascular disease, unspecified: Secondary | ICD-10-CM

## 2011-10-04 DIAGNOSIS — I6529 Occlusion and stenosis of unspecified carotid artery: Secondary | ICD-10-CM

## 2011-10-04 MED ORDER — BUPROPION HCL ER (XL) 150 MG PO TB24: 150.0000 mg | ORAL_TABLET | Freq: Two times a day (BID) | ORAL | Status: AC

## 2011-10-04 NOTE — Assessment & Plan Note (Signed)
Reviewed duplex from today Stable bilateral 60-70% disease  F/U duplex in 6 months ASA  Connection of vascular disease to smoking discussed

## 2011-10-04 NOTE — Assessment & Plan Note (Signed)
Discussed low carb diet.  Target hemoglobin A1c is 6.5 or less.  Continue current medications.  

## 2011-10-04 NOTE — Patient Instructions (Signed)
Your physician wants you to follow-up in: 6 MONTHS WITH DR Haywood Filler will receive a reminder letter in the mail two months in advance. If you don't receive a letter, please call our office to schedule the follow-up appointment. Your physician recommends that you continue on your current medications as directed. Please refer to the Current Medication list given to you today. ADD [facility] FOR SMOKING CESSATION

## 2011-10-04 NOTE — Progress Notes (Signed)
[name] [name] returns today for followup. She has had previous stenting of the right coronary artery. She has had resting tachycardia. We started her on beta-blockers and she improved. We did not find a good reason forher tachycardia, although she is diabetic and may have some autonomic dysfunction. She has good LV function. She has bilateral carotid disease. I reviewed duplex from June and was concerned since her systolic velocities were greater than 26m/sec and there was lots of calcium These were at the high end. F/U CTA also reviewed and indicated less than 80% disease and Dr Edilia Bo . I spoke with him personally about the decidsion making process in this case There have been no TIA like symptoms. Duplex today was stable with bilateral 60-79% stenosis. F/U duplex then March 2013  She has smoked since age 31. She is not motivated to quit. I counseled her for less than 10 minutes She had previously tried patches 2 years agao with limited success and they were too expensive. Willing to try Welbutrin but not Chantix . Her BS has been reasonably controlled despite a poor diet.    ROS: Denies fever, malais, weight loss, blurry vision, decreased visual acuity, cough, sputum, SOB, hemoptysis, pleuritic pain, palpitaitons, heartburn, abdominal pain, melena, lower extremity edema, claudication, or rash.  All other systems reviewed and negative  General: Affect appropriate Overweight white female HEENT: normal Neck supple with no adenopathy JVP normal Bilateral  bruits no thyromegaly Lungs clear with no wheezing and good diaphragmatic motion Heart:  S1/S2 no murmur, no rub, gallop or click PMI normal Abdomen: benighn, BS positve, no tenderness, no AAA no bruit.  No HSM or HJR Distal pulses intact with no bruits Trace bilateral  edema Neuro non-focal Skin warm and dry No muscular weakness   Current Outpatient Prescriptions  Medication Sig Dispense Refill  . atorvastatin (LIPITOR) 80 MG tablet Take 1 tablet  (80 mg total) by mouth daily.  30 tablet  6  . clopidogrel (PLAVIX) 75 MG tablet Take 1 tablet (75 mg total) by mouth daily.  30 tablet  6  . glimepiride (AMARYL) 2 MG tablet Take 2 mg by mouth daily before breakfast.        . lisinopril-hydrochlorothiazide (PRINZIDE,ZESTORETIC) 20-12.5 MG per tablet Take 1 tablet by mouth daily.  30 tablet  9  . metoprolol (TOPROL-XL) 50 MG 24 hr tablet Take 1.5 tablets (75 mg total) by mouth 2 (two) times daily.  90 tablet  6  . nitroGLYCERIN (NITROSTAT) 0.4 MG SL tablet Place 0.4 mg under the tongue every 5 (five) minutes as needed.        . sitaGLIPtan-metformin (JANUMET) 50-500 MG per tablet Take 1 tablet by mouth 2 (two) times daily with a meal.          Allergies  Review of patient's allergies indicates no known allergies.  Electrocardiogram:  NSR rate 79  Mild low voltage but otherwise normal  Assessment and Plan

## 2011-10-04 NOTE — Assessment & Plan Note (Signed)
Cholesterol is at goal.  Continue current dose of statin and diet Rx.  No myalgias or side effects.  F/U  LFT's in 6 months. No results found for this basename: [facility]   Labs with Dr Modesto Charon at [facility]

## 2011-10-04 NOTE — Assessment & Plan Note (Signed)
Welbutrin with 7 mg patch or gum  Only smokes a pack q 3 days.

## 2011-10-12 DIAGNOSIS — E11329 Type 2 diabetes mellitus with mild nonproliferative diabetic retinopathy without macular edema: Secondary | ICD-10-CM | POA: Diagnosis not present

## 2011-10-25 DIAGNOSIS — H2589 Other age-related cataract: Secondary | ICD-10-CM | POA: Diagnosis not present

## 2011-10-25 DIAGNOSIS — H35379 Puckering of macula, unspecified eye: Secondary | ICD-10-CM | POA: Diagnosis not present

## 2011-10-26 ENCOUNTER — Encounter (HOSPITAL_COMMUNITY): Payer: Self-pay | Admitting: Pharmacy Technician

## 2011-10-28 ENCOUNTER — Encounter (HOSPITAL_COMMUNITY)
Admission: RE | Admit: 2011-10-28 | Discharge: 2011-10-28 | Disposition: A | Discharge status: Home / Self Care | Payer: Medicare Other | Source: Ambulatory Visit | Attending: Ophthalmology | Admitting: Ophthalmology

## 2011-10-28 ENCOUNTER — Encounter (HOSPITAL_COMMUNITY): Payer: Self-pay

## 2011-10-28 DIAGNOSIS — H268 Other specified cataract: Secondary | ICD-10-CM | POA: Diagnosis not present

## 2011-10-28 DIAGNOSIS — Z01812 Encounter for preprocedural laboratory examination: Secondary | ICD-10-CM | POA: Diagnosis not present

## 2011-10-28 DIAGNOSIS — I10 Essential (primary) hypertension: Secondary | ICD-10-CM | POA: Diagnosis not present

## 2011-10-28 DIAGNOSIS — I2510 Atherosclerotic heart disease of native coronary artery without angina pectoris: Secondary | ICD-10-CM | POA: Diagnosis not present

## 2011-10-28 DIAGNOSIS — E119 Type 2 diabetes mellitus without complications: Secondary | ICD-10-CM | POA: Diagnosis not present

## 2011-10-28 HISTORY — DX: Other specified postprocedural states: Z98.890

## 2011-10-28 HISTORY — DX: Other specified postprocedural states: R11.2

## 2011-10-28 HISTORY — DX: Angina pectoris, unspecified: I20.9

## 2011-10-28 LAB — HEMOGLOBIN AND HEMATOCRIT, BLOOD: Hemoglobin: 11 g/dL — ABNORMAL LOW (ref 12.0–15.0)

## 2011-10-28 LAB — BASIC METABOLIC PANEL
BUN: 27 mg/dL — ABNORMAL HIGH (ref 6–23)
GFR calc non Af Amer: 31 mL/min — ABNORMAL LOW (ref >90)
Glucose, Bld: 87 mg/dL (ref 70–99)

## 2011-10-28 NOTE — Progress Notes (Signed)
10/28/11 0927  OBSTRUCTIVE SLEEP APNEA  Have you ever been diagnosed with sleep apnea through a sleep study? No  Do you snore loudly (loud enough to be heard through closed doors)?  0  Do you often feel tired, fatigued, or sleepy during the daytime? 1  Has anyone observed you stop breathing during your sleep? 1  Do you have, or are you being treated for high blood pressure? 1  BMI more than 35 kg/m2? 1  Age over 74 years old? 1  Neck circumference greater than 40 cm/18 inches? 0  Obstructive Sleep Apnea Score 5   Score 4 or greater  Updated health history;Results sent to PCP

## 2011-10-28 NOTE — Patient Instructions (Addendum)
22 [name] [name]  10/28/2011   Your procedure is scheduled on:   11/01/2011  Report to [facility] at  930  AM.  Call this number if you have problems the morning of surgery: [phone]   Remember:   Do not eat food:After Midnight.  May have clear liquids:until Midnight .  Clear liquids include soda, tea, black coffee, apple or grape juice, broth.  Take these medicines the morning of surgery with A SIP OF WATER: wellbutrin,lisinopril,toprol   Do not wear jewelry, make-up or nail polish.  Do not wear lotions, powders, or perfumes. You may wear deodorant.  Do not shave 48 hours prior to surgery.  Do not bring valuables to the hospital.  Contacts, dentures or bridgework may not be worn into surgery.  Leave suitcase in the car. After surgery it may be brought to your room.  For patients admitted to the hospital, checkout time is 11:00 AM the day of discharge.   Patients discharged the day of surgery will not be allowed to drive home.  Name and phone number of your driver: family  Special Instructions: N/A   Please read over the following fact sheets that you were given: Pain Booklet, Surgical Site Infection Prevention, Anesthesia Post-op Instructions and Care and Recovery After Surgery Cataract A cataract is a clouding of the lens of the eye. When a lens becomes cloudy, vision is reduced based on the degree and nature of the clouding. Many cataracts reduce vision to some degree. Some cataracts make people more near-sighted as they develop. Other cataracts increase glare. Cataracts that are ignored and become worse can sometimes look white. The white color can be seen through the pupil. CAUSES   Aging. However, cataracts may occur at any age, even in newborns.   Certain drugs.   Trauma to the eye.   Certain diseases such as diabetes.   Specific eye diseases such as chronic inflammation inside the eye or a sudden attack of a rare form of glaucoma.   Inherited or acquired medical problems.    SYMPTOMS   Gradual, progressive drop in vision in the affected eye.   Severe, rapid visual loss. This most often happens when trauma is the cause.  DIAGNOSIS  To detect a cataract, an eye doctor examines the lens. Cataracts are best diagnosed with an exam of the eyes with the pupils enlarged (dilated) by drops.  TREATMENT  For an early cataract, vision may improve by using different eyeglasses or stronger lighting. If that does not help your vision, surgery is the only effective treatment. A cataract needs to be surgically removed when vision loss interferes with your everyday activities, such as driving, reading, or watching TV. A cataract may also have to be removed if it prevents examination or treatment of another eye problem. Surgery removes the cloudy lens and usually replaces it with a substitute lens (intraocular lens, IOL).  At a time when both you and your doctor agree, the cataract will be surgically removed. If you have cataracts in both eyes, only one is usually removed at a time. This allows the operated eye to heal and be out of danger from any possible problems after surgery (such as infection or poor wound healing). In rare cases, a cataract may be doing damage to your eye. In these cases, your caregiver may advise surgical removal right away. The vast majority of people who have cataract surgery have better vision afterward. HOME CARE INSTRUCTIONS  If you are not planning surgery, you may  be asked to do the following:  Use different eyeglasses.   Use stronger or brighter lighting.   Ask your eye doctor about reducing your medicine dose or changing medicines if it is thought that a medicine caused your cataract. Changing medicines does not make the cataract go away on its own.   Become familiar with your surroundings. Poor vision can lead to injury. Avoid bumping into things on the affected side. You are at a higher risk for tripping or falling.   Exercise extreme care when  driving or operating machinery.   Wear sunglasses if you are sensitive to bright light or experiencing problems with glare.  SEEK IMMEDIATE MEDICAL CARE IF:   You have a worsening or sudden vision loss.   You notice redness, swelling, or increasing pain in the eye.   You have a fever.  Document Released: 07/12/2005 Document Revised: 07/01/2011 Document Reviewed: 03/05/2011 [facility] Patient Information 2012 [place], [place].PATIENT INSTRUCTIONS POST-ANESTHESIA  IMMEDIATELY FOLLOWING SURGERY:  Do not drive or operate machinery for the first twenty four hours after surgery.  Do not make any important decisions for twenty four hours after surgery or while taking narcotic pain medications or sedatives.  If you develop intractable nausea and vomiting or a severe headache please notify your doctor immediately.  FOLLOW-UP:  Please make an appointment with your surgeon as instructed. You do not need to follow up with anesthesia unless specifically instructed to do so.  WOUND CARE INSTRUCTIONS (if applicable):  Keep a dry clean dressing on the anesthesia/puncture wound site if there is drainage.  Once the wound has quit draining you may leave it open to air.  Generally you should leave the bandage intact for twenty four hours unless there is drainage.  If the epidural site drains for more than 36-48 hours please call the anesthesia department.  QUESTIONS?:  Please feel free to call your physician or the hospital operator if you have any questions, and they will be happy to assist you.     [facility] Anesthesia Department [address] [place] [phone]

## 2011-10-29 ENCOUNTER — Other Ambulatory Visit: Payer: Self-pay | Admitting: Cardiovascular Disease

## 2011-10-29 ENCOUNTER — Other Ambulatory Visit: Payer: Self-pay

## 2011-10-29 MED ORDER — TETRACAINE HCL 0.5 % OP SOLN
OPHTHALMIC | Status: AC
  Filled 2011-10-29: qty 2

## 2011-10-29 MED ORDER — NEOMYCIN-POLYMYXIN-DEXAMETH 3.5-10000-0.1 OP OINT
TOPICAL_OINTMENT | OPHTHALMIC | Status: AC
  Filled 2011-10-29: qty 3.5

## 2011-10-29 MED ORDER — CYCLOPENTOLATE-PHENYLEPHRINE 0.2-1 % OP SOLN
OPHTHALMIC | Status: AC
  Filled 2011-10-29: qty 2

## 2011-10-29 MED ORDER — LIDOCAINE HCL 3.5 % OP GEL
OPHTHALMIC | Status: AC
  Filled 2011-10-29: qty 5

## 2011-10-29 MED ORDER — LIDOCAINE HCL (PF) 1 % IJ SOLN
INTRAMUSCULAR | Status: AC
  Filled 2011-10-29: qty 2

## 2011-10-29 MED ORDER — PHENYLEPHRINE HCL 2.5 % OP SOLN
OPHTHALMIC | Status: AC
  Filled 2011-10-29: qty 2

## 2011-10-29 MED ORDER — LISINOPRIL-HYDROCHLOROTHIAZIDE 20-12.5 MG PO TABS: 1.0000 | ORAL_TABLET | Freq: Every day | ORAL | Status: DC

## 2011-11-01 ENCOUNTER — Ambulatory Visit (HOSPITAL_COMMUNITY)
Admission: RE | Admit: 2011-11-01 | Discharge: 2011-11-01 | Disposition: A | Discharge status: Home / Self Care | Payer: Medicare Other | Source: Ambulatory Visit | Attending: Ophthalmology | Admitting: Ophthalmology

## 2011-11-01 ENCOUNTER — Ambulatory Visit (HOSPITAL_COMMUNITY): Payer: Medicare Other | Admitting: Anesthesiology

## 2011-11-01 ENCOUNTER — Encounter (HOSPITAL_COMMUNITY): Payer: Self-pay | Admitting: Anesthesiology

## 2011-11-01 ENCOUNTER — Encounter (HOSPITAL_COMMUNITY): Payer: Self-pay | Admitting: *Deleted

## 2011-11-01 ENCOUNTER — Encounter (HOSPITAL_COMMUNITY)
Admission: RE | Disposition: A | Discharge status: Home / Self Care | Payer: Self-pay | Source: Ambulatory Visit | Attending: Ophthalmology

## 2011-11-01 DIAGNOSIS — Z01812 Encounter for preprocedural laboratory examination: Secondary | ICD-10-CM | POA: Insufficient documentation

## 2011-11-01 DIAGNOSIS — E119 Type 2 diabetes mellitus without complications: Secondary | ICD-10-CM | POA: Diagnosis not present

## 2011-11-01 DIAGNOSIS — I10 Essential (primary) hypertension: Secondary | ICD-10-CM | POA: Insufficient documentation

## 2011-11-01 DIAGNOSIS — I2510 Atherosclerotic heart disease of native coronary artery without angina pectoris: Secondary | ICD-10-CM | POA: Insufficient documentation

## 2011-11-01 DIAGNOSIS — H2589 Other age-related cataract: Secondary | ICD-10-CM | POA: Diagnosis not present

## 2011-11-01 DIAGNOSIS — H268 Other specified cataract: Secondary | ICD-10-CM | POA: Diagnosis not present

## 2011-11-01 DIAGNOSIS — H269 Unspecified cataract: Secondary | ICD-10-CM | POA: Diagnosis not present

## 2011-11-01 HISTORY — PX: CATARACT EXTRACTION W/PHACO: SHX586

## 2011-11-01 LAB — GLUCOSE, CAPILLARY: Glucose-Capillary: 110 mg/dL — ABNORMAL HIGH (ref 70–99)

## 2011-11-01 SURGERY — PHACOEMULSIFICATION, CATARACT, WITH IOL INSERTION
Anesthesia: Monitor Anesthesia Care | Site: Eye | Laterality: Right | Wound class: Clean

## 2011-11-01 MED ORDER — ONDANSETRON HCL 4 MG/2ML IJ SOLN: 4.0000 mg | Freq: Once | INTRAMUSCULAR | Status: DC | PRN

## 2011-11-01 MED ORDER — BSS IO SOLN
INTRAOCULAR | Status: DC | PRN
  Administered 2011-11-01: 15 mL via INTRAOCULAR

## 2011-11-01 MED ORDER — MIDAZOLAM HCL 2 MG/2ML IJ SOLN
INTRAMUSCULAR | Status: AC
  Administered 2011-11-01: 2 mg via INTRAVENOUS
  Filled 2011-11-01: qty 2

## 2011-11-01 MED ORDER — TETRACAINE HCL 0.5 % OP SOLN
1.0000 [drp] | OPHTHALMIC | Status: AC
  Administered 2011-11-01 (×3): 1 [drp] via OPHTHALMIC

## 2011-11-01 MED ORDER — LIDOCAINE HCL (PF) 1 % IJ SOLN
INTRAMUSCULAR | Status: DC | PRN
  Administered 2011-11-01: .5 mL

## 2011-11-01 MED ORDER — POVIDONE-IODINE 5 % OP SOLN
OPHTHALMIC | Status: DC | PRN
  Administered 2011-11-01: 1 via OPHTHALMIC

## 2011-11-01 MED ORDER — EPINEPHRINE HCL 1 MG/ML IJ SOLN
INTRAMUSCULAR | Status: AC
  Filled 2011-11-01: qty 1

## 2011-11-01 MED ORDER — LIDOCAINE HCL 3.5 % OP GEL: 1.0000 "application " | Freq: Once | OPHTHALMIC | Status: DC

## 2011-11-01 MED ORDER — PHENYLEPHRINE HCL 2.5 % OP SOLN
1.0000 [drp] | OPHTHALMIC | Status: AC
  Administered 2011-11-01 (×3): 1 [drp] via OPHTHALMIC

## 2011-11-01 MED ORDER — MIDAZOLAM HCL 2 MG/2ML IJ SOLN
1.0000 mg | INTRAMUSCULAR | Status: DC | PRN
  Administered 2011-11-01: 2 mg via INTRAVENOUS

## 2011-11-01 MED ORDER — FENTANYL CITRATE 0.05 MG/ML IJ SOLN: 25.0000 ug | INTRAMUSCULAR | Status: DC | PRN

## 2011-11-01 MED ORDER — LACTATED RINGERS IV SOLN
INTRAVENOUS | Status: DC
  Administered 2011-11-01: 1000 mL via INTRAVENOUS

## 2011-11-01 MED ORDER — CYCLOPENTOLATE-PHENYLEPHRINE 0.2-1 % OP SOLN
1.0000 [drp] | OPHTHALMIC | Status: AC
  Administered 2011-11-01 (×3): 1 [drp] via OPHTHALMIC

## 2011-11-01 MED ORDER — LIDOCAINE 3.5 % OP GEL OPTIME - NO CHARGE
OPHTHALMIC | Status: DC | PRN
  Administered 2011-11-01: 1 [drp] via OPHTHALMIC

## 2011-11-01 MED ORDER — NEOMYCIN-POLYMYXIN-DEXAMETH 0.1 % OP OINT
TOPICAL_OINTMENT | OPHTHALMIC | Status: DC | PRN
  Administered 2011-11-01: 1 via OPHTHALMIC

## 2011-11-01 MED ORDER — PROVISC 10 MG/ML IO SOLN
INTRAOCULAR | Status: DC | PRN
  Administered 2011-11-01: 8.5 mg via INTRAOCULAR

## 2011-11-01 MED ORDER — EPINEPHRINE HCL 1 MG/ML IJ SOLN
INTRAOCULAR | Status: DC | PRN
  Administered 2011-11-01: 11:00:00

## 2011-11-01 SURGICAL SUPPLY — 31 items
CAPSULAR TENSION RING-AMO (OPHTHALMIC RELATED) IMPLANT
CLOTH BEACON ORANGE TIMEOUT ST (SAFETY) ×1 IMPLANT
EYE SHIELD UNIVERSAL CLEAR (GAUZE/BANDAGES/DRESSINGS) ×2 IMPLANT
GLOVE BIO SURGEON STRL SZ 6.5 (GLOVE) IMPLANT
GLOVE BIOGEL PI IND STRL 6.5 (GLOVE) IMPLANT
GLOVE BIOGEL PI IND STRL 7.0 (GLOVE) IMPLANT
GLOVE BIOGEL PI IND STRL 7.5 (GLOVE) IMPLANT
GLOVE BIOGEL PI INDICATOR 6.5 (GLOVE)
GLOVE BIOGEL PI INDICATOR 7.0 (GLOVE) ×1
GLOVE BIOGEL PI INDICATOR 7.5 (GLOVE)
GLOVE ECLIPSE 6.5 STRL STRAW (GLOVE) IMPLANT
GLOVE ECLIPSE 7.0 STRL STRAW (GLOVE) IMPLANT
GLOVE ECLIPSE 7.5 STRL STRAW (GLOVE) IMPLANT
GLOVE EXAM NITRILE LRG STRL (GLOVE) IMPLANT
GLOVE EXAM NITRILE MD LF STRL (GLOVE) ×1 IMPLANT
GLOVE SKINSENSE NS SZ6.5 (GLOVE)
GLOVE SKINSENSE NS SZ7.0 (GLOVE)
GLOVE SKINSENSE STRL SZ6.5 (GLOVE) IMPLANT
GLOVE SKINSENSE STRL SZ7.0 (GLOVE) IMPLANT
KIT VITRECTOMY (OPHTHALMIC RELATED) IMPLANT
PAD ARMBOARD 7.5X6 YLW CONV (MISCELLANEOUS) ×1 IMPLANT
PROC W NO LENS (INTRAOCULAR LENS)
PROC W SPEC LENS (INTRAOCULAR LENS)
PROCESS W NO LENS (INTRAOCULAR LENS) IMPLANT
PROCESS W SPEC LENS (INTRAOCULAR LENS) IMPLANT
RING MALYGIN (MISCELLANEOUS) IMPLANT
SIGHTPATH CAT PROC W REG LENS (Ophthalmic Related) ×2 IMPLANT
SYR TB 1ML LL NO SAFETY (SYRINGE) ×1 IMPLANT
TAPE CLOTH SOFT 2X10 (GAUZE/BANDAGES/DRESSINGS) ×1 IMPLANT
VISCOELASTIC ADDITIONAL (OPHTHALMIC RELATED) IMPLANT
WATER STERILE IRR 250ML POUR (IV SOLUTION) ×1 IMPLANT

## 2011-11-01 NOTE — H&P (Signed)
I have reviewed the H&P, the patient was re-examined, and I have identified no interval changes in medical condition and plan of care since the history and physical of record  

## 2011-11-01 NOTE — Anesthesia Preprocedure Evaluation (Signed)
Anesthesia Evaluation  Patient identified by MRN, date of birth, ID band Patient awake    Reviewed: Allergy & Precautions, H&P , NPO status , Patient's Chart, lab work & pertinent test results  History of Anesthesia Complications (+) PONV  Airway Mallampati: II      Dental  (+) Edentulous Upper and Edentulous Lower   Pulmonary  breath sounds clear to auscultation        Cardiovascular hypertension, + angina + CAD and + Past MI Rhythm:Regular Rate:Normal     Neuro/Psych    GI/Hepatic   Endo/Other  Diabetes mellitus-, Well Controlled, Type 2, Oral Hypoglycemic Agents  Renal/GU      Musculoskeletal   Abdominal   Peds  Hematology   Anesthesia Other Findings   Reproductive/Obstetrics                           Anesthesia Physical Anesthesia Plan  ASA: III  Anesthesia Plan: MAC   Post-op Pain Management:    Induction: Intravenous  Airway Management Planned: Nasal Cannula  Additional Equipment:   Intra-op Plan:   Post-operative Plan:   Informed Consent: I have reviewed the patients History and Physical, chart, labs and discussed the procedure including the risks, benefits and alternatives for the proposed anesthesia with the patient or authorized representative who has indicated his/her understanding and acceptance.     Plan Discussed with:   Anesthesia Plan Comments:         Anesthesia Quick Evaluation

## 2011-11-01 NOTE — Discharge Instructions (Signed)
[name] [name]  11/01/2011     Instructions  1. Use medications as Instructed.  Shake well before use. Wait 5 minutes between drops.  {OPHTHALMIC ANTIBIOTICS:22167} 4 times a day x 1 week.  {OPHTHALMIC ANTI-INFLAMMATORY:22168} 2 times a day x 4 weeks.  {OPHTHALMIC STEROID:22169} 4 times a day - week 1   3 times a day - Week 2, 2 times a day- Week 3, 1 time a day - Week 4.  2. Do not rub the operative eye. Do not swim underwater for 2 weeks.  3. You may remove the clear shield and resume your normal activities the day after  Surgery. Your eyes may feel more comfortable if you wear dark glasses outside.  4. Call our office at [phone] if you have sudden change in vision, extreme redness or pain. Some fluctuation in vision is normal after surgery. If you have an emergency after hours, call Dr. Alto Denver at [phone].  5. It is important that you attend all of your follow-up appointments.        Follow-up:{follow up:32580} with Gemma Payor, MD.   Dr. Lahoma Crocker: [phone]  Dr. Lita Mains: [phone]  Dr. Alto Denver: [phone]   If you find that you cannot contact your physician, but feel that your signs and   Symptoms warrant a physician's attention, call the Emergency Room at   [phone] ext.[phone].   Other{NA AND WUXLKGMW:10272}.

## 2011-11-01 NOTE — Transfer of Care (Signed)
Immediate Anesthesia Transfer of Care Note  Patient: [name] [name]  Procedure(s) Performed: Procedure(s) (LRB): CATARACT EXTRACTION PHACO AND INTRAOCULAR LENS PLACEMENT (IOC) (Right)  Patient Location: PACU and Short Stay  Anesthesia Type: MAC  Level of Consciousness: awake, alert , oriented and patient cooperative  Airway & Oxygen Therapy: Patient Spontanous Breathing  Post-op Assessment: Report given to PACU RN, Post -op Vital signs reviewed and stable and Patient moving all extremities  Post vital signs: Reviewed and stable  Complications: No apparent anesthesia complications

## 2011-11-01 NOTE — Brief Op Note (Signed)
Pre-Op Dx: Cataract OD Post-Op Dx: Cataract OD Surgeon: Kelis Plasse Anesthesia: Topical with MAC Implant: Lenstec, Model Softec HD Blood Loss: None Specimen: None Complications: None 

## 2011-11-01 NOTE — Op Note (Signed)
[name] [name], [name] [name]                  ACCOUNT NO.:  [id number]  MEDICAL RECORD NO.:  [id number]  LOCATION:  [facility]                          FACILITY:  [facility]  PHYSICIAN:  Susanne Greenhouse, MD       DATE OF BIRTH:  January 06, 1936  DATE OF PROCEDURE:  11/01/2011 DATE OF DISCHARGE:  11/01/2011                              OPERATIVE REPORT   PREOPERATIVE DIAGNOSIS:  Combined cataract, right eye, diagnosis code 366.19.  POSTOPERATIVE DIAGNOSIS:  Combined cataract, right eye, diagnosis code 366.19.  OPERATION PERFORMED:  Phacoemulsification with posterior chamber intraocular lens implantation, right eye.  SURGEON:  Bonne Dolores. Alto Denver, MD  ANESTHESIA:  Local with monitored anesthesia care.  OPERATIVE SUMMARY:  In the preoperative area, dilating drops were placed into the right eye.  The patient was then brought into the operating room where she was placed under general anesthesia.  The eye was then prepped and draped.  Beginning with a 75 blade, a paracentesis port was made at the surgeon's 2 o'clock position.  The anterior chamber was then filled with a 1% nonpreserved lidocaine solution with epinephrine.  This was followed by Viscoat to deepen the chamber.  A small fornix-based peritomy was performed superiorly.  Next, a single iris hook was placed through the limbus superiorly.  A 2.4-mm keratome blade was then used to make a clear corneal incision over the iris hook.  A bent cystotome needle and Utrata forceps were used to create a continuous tear capsulotomy.  Hydrodissection was performed using balanced salt solution on a fine cannula.  The lens nucleus was then removed using phacoemulsification in a quadrant cracking technique.  The cortical material was then removed with irrigation and aspiration.  The capsular bag and anterior chamber were refilled with Provisc.  The wound was widened to approximately 3 mm and a posterior chamber intraocular lens was placed into the capsular bag without  difficulty using an [employer] lens injecting system.  A single 10-0 nylon suture was then used to close the incision as well as stromal hydration.  The Provisc was removed from the anterior chamber and capsular bag with irrigation and aspiration.  At this point, the wounds were tested for leak, which were negative.  The anterior chamber remained deep and stable.  The patient tolerated the procedure well.  There were no operative complications, and she awoke from general anesthesia without problem.  No surgical specimens.  Prosthetic device used is a Lenstec posterior chamber lens, model Softec HD, power of 18.75, serial number is [id number].          ______________________________ Susanne Greenhouse, MD     KEH/MEDQ  D:  11/01/2011  T:  11/01/2011  Job:  [id number]

## 2011-11-01 NOTE — Anesthesia Postprocedure Evaluation (Signed)
  Anesthesia Post-op Note  Patient: [name] [name]  Procedure(s) Performed: Procedure(s) (LRB): CATARACT EXTRACTION PHACO AND INTRAOCULAR LENS PLACEMENT (IOC) (Right)  Patient Location: PACU and Short Stay  Anesthesia Type: MAC  Level of Consciousness: awake, alert , oriented and patient cooperative  Airway and Oxygen Therapy: Patient Spontanous Breathing  Post-op Pain: none  Post-op Assessment: Post-op Vital signs reviewed, Patient's Cardiovascular Status Stable, Respiratory Function Stable, RESPIRATORY FUNCTION UNSTABLE and No signs of Nausea or vomiting  Post-op Vital Signs: Reviewed and stable  Complications: No apparent anesthesia complications

## 2011-11-02 ENCOUNTER — Other Ambulatory Visit: Payer: Self-pay | Admitting: *Deleted

## 2011-11-02 MED ORDER — ATORVASTATIN CALCIUM 80 MG PO TABS: 80.0000 mg | ORAL_TABLET | Freq: Every day | ORAL | Status: DC

## 2011-11-02 MED ORDER — METOPROLOL SUCCINATE ER 50 MG PO TB24: ORAL_TABLET | ORAL | Status: DC

## 2011-11-04 ENCOUNTER — Other Ambulatory Visit: Payer: Self-pay | Admitting: Cardiovascular Disease

## 2011-11-04 ENCOUNTER — Encounter (HOSPITAL_COMMUNITY): Payer: Self-pay | Admitting: Ophthalmology

## 2011-11-04 MED ORDER — LISINOPRIL-HYDROCHLOROTHIAZIDE 20-12.5 MG PO TABS: 1.0000 | ORAL_TABLET | Freq: Every day | ORAL | Status: DC

## 2011-11-29 ENCOUNTER — Other Ambulatory Visit: Payer: Self-pay | Admitting: Cardiovascular Disease

## 2011-11-29 NOTE — Telephone Encounter (Signed)
Spoke with pharmacy they state the did not see this request. Called pt she states she has enough refills.[name] pharmacy an extra supply of refills they states. They will put it on hold because its to early to fill this medication.

## 2011-11-29 NOTE — Telephone Encounter (Signed)
cilostazol 100mg  [facility], said they faxed [place] twice with no response told pt to call, pt now out needs asap

## 2011-12-14 DIAGNOSIS — E119 Type 2 diabetes mellitus without complications: Secondary | ICD-10-CM | POA: Diagnosis not present

## 2011-12-14 DIAGNOSIS — E785 Hyperlipidemia, unspecified: Secondary | ICD-10-CM | POA: Diagnosis not present

## 2011-12-14 DIAGNOSIS — N189 Chronic kidney disease, unspecified: Secondary | ICD-10-CM | POA: Diagnosis not present

## 2011-12-21 DIAGNOSIS — R7989 Other specified abnormal findings of blood chemistry: Secondary | ICD-10-CM | POA: Diagnosis not present

## 2011-12-21 DIAGNOSIS — E875 Hyperkalemia: Secondary | ICD-10-CM | POA: Diagnosis not present

## 2012-01-13 DIAGNOSIS — H2589 Other age-related cataract: Secondary | ICD-10-CM | POA: Diagnosis not present

## 2012-01-24 DIAGNOSIS — IMO0001 Reserved for inherently not codable concepts without codable children: Secondary | ICD-10-CM | POA: Diagnosis not present

## 2012-01-24 DIAGNOSIS — N189 Chronic kidney disease, unspecified: Secondary | ICD-10-CM | POA: Diagnosis not present

## 2012-01-24 NOTE — Patient Instructions (Addendum)
Your procedure is scheduled on: 01/31/2012  Report to [facility] at  1030       AM.  Call this number if you have problems the morning of surgery: [phone]   Do not eat food or drink liquids :After Midnight.      Take these medicines the morning of surgery with A SIP OF WATER:wellbutrin,lisinopril,metoprolol   Do not wear jewelry, make-up or nail polish.  Do not wear lotions, powders, or perfumes. You may wear deodorant.  Do not shave 48 hours prior to surgery.  Do not bring valuables to the hospital.  Contacts, dentures or bridgework may not be worn into surgery.  Leave suitcase in the car. After surgery it may be brought to your room.  For patients admitted to the hospital, checkout time is 11:00 AM the day of discharge.   Patients discharged the day of surgery will not be allowed to drive home.  :     Please read over the following fact sheets that you were given: Coughing and Deep Breathing, Surgical Site Infection Prevention, Anesthesia Post-op Instructions and Care and Recovery After Surgery    Cataract A cataract is a clouding of the lens of the eye. When a lens becomes cloudy, vision is reduced based on the degree and nature of the clouding. Many cataracts reduce vision to some degree. Some cataracts make people more near-sighted as they develop. Other cataracts increase glare. Cataracts that are ignored and become worse can sometimes look white. The white color can be seen through the pupil. CAUSES   Aging. However, cataracts may occur at any age, even in newborns.   Certain drugs.   Trauma to the eye.   Certain diseases such as diabetes.   Specific eye diseases such as chronic inflammation inside the eye or a sudden attack of a rare form of glaucoma.   Inherited or acquired medical problems.  SYMPTOMS   Gradual, progressive drop in vision in the affected eye.   Severe, rapid visual loss. This most often happens when trauma is the cause.  DIAGNOSIS  To detect a  cataract, an eye doctor examines the lens. Cataracts are best diagnosed with an exam of the eyes with the pupils enlarged (dilated) by drops.  TREATMENT  For an early cataract, vision may improve by using different eyeglasses or stronger lighting. If that does not help your vision, surgery is the only effective treatment. A cataract needs to be surgically removed when vision loss interferes with your everyday activities, such as driving, reading, or watching TV. A cataract may also have to be removed if it prevents examination or treatment of another eye problem. Surgery removes the cloudy lens and usually replaces it with a substitute lens (intraocular lens, IOL).  At a time when both you and your doctor agree, the cataract will be surgically removed. If you have cataracts in both eyes, only one is usually removed at a time. This allows the operated eye to heal and be out of danger from any possible problems after surgery (such as infection or poor wound healing). In rare cases, a cataract may be doing damage to your eye. In these cases, your caregiver may advise surgical removal right away. The vast majority of people who have cataract surgery have better vision afterward. HOME CARE INSTRUCTIONS  If you are not planning surgery, you may be asked to do the following:  Use different eyeglasses.   Use stronger or brighter lighting.   Ask your eye doctor about reducing  your medicine dose or changing medicines if it is thought that a medicine caused your cataract. Changing medicines does not make the cataract go away on its own.   Become familiar with your surroundings. Poor vision can lead to injury. Avoid bumping into things on the affected side. You are at a higher risk for tripping or falling.   Exercise extreme care when driving or operating machinery.   Wear sunglasses if you are sensitive to bright light or experiencing problems with glare.  SEEK IMMEDIATE MEDICAL CARE IF:   You have a  worsening or sudden vision loss.   You notice redness, swelling, or increasing pain in the eye.   You have a fever.  Document Released: 07/12/2005 Document Revised: 07/01/2011 Document Reviewed: 03/05/2011 [facility] Patient Information 2012 [place], [place].PATIENT INSTRUCTIONS POST-ANESTHESIA  IMMEDIATELY FOLLOWING SURGERY:  Do not drive or operate machinery for the first twenty four hours after surgery.  Do not make any important decisions for twenty four hours after surgery or while taking narcotic pain medications or sedatives.  If you develop intractable nausea and vomiting or a severe headache please notify your doctor immediately.  FOLLOW-UP:  Please make an appointment with your surgeon as instructed. You do not need to follow up with anesthesia unless specifically instructed to do so.  WOUND CARE INSTRUCTIONS (if applicable):  Keep a dry clean dressing on the anesthesia/puncture wound site if there is drainage.  Once the wound has quit draining you may leave it open to air.  Generally you should leave the bandage intact for twenty four hours unless there is drainage.  If the epidural site drains for more than 36-48 hours please call the anesthesia department.  QUESTIONS?:  Please feel free to call your physician or the hospital operator if you have any questions, and they will be happy to assist you.

## 2012-01-25 ENCOUNTER — Encounter (HOSPITAL_COMMUNITY)
Admission: RE | Admit: 2012-01-25 | Discharge: 2012-01-25 | Disposition: A | Discharge status: Home / Self Care | Payer: Medicare Other | Source: Ambulatory Visit | Attending: Ophthalmology | Admitting: Ophthalmology

## 2012-01-26 ENCOUNTER — Encounter (HOSPITAL_COMMUNITY): Payer: Self-pay | Admitting: Pharmacy Technician

## 2012-01-28 MED ORDER — CYCLOPENTOLATE-PHENYLEPHRINE 0.2-1 % OP SOLN
OPHTHALMIC | Status: AC
  Filled 2012-01-28: qty 2

## 2012-01-28 MED ORDER — TETRACAINE HCL 0.5 % OP SOLN
OPHTHALMIC | Status: AC
  Filled 2012-01-28: qty 2

## 2012-01-28 MED ORDER — LIDOCAINE HCL (PF) 1 % IJ SOLN
INTRAMUSCULAR | Status: AC
  Filled 2012-01-28: qty 2

## 2012-01-28 MED ORDER — NEOMYCIN-POLYMYXIN-DEXAMETH 3.5-10000-0.1 OP OINT
TOPICAL_OINTMENT | OPHTHALMIC | Status: AC
  Filled 2012-01-28: qty 3.5

## 2012-01-28 MED ORDER — PHENYLEPHRINE HCL 2.5 % OP SOLN
OPHTHALMIC | Status: AC
  Filled 2012-01-28: qty 2

## 2012-01-28 MED ORDER — LIDOCAINE HCL 3.5 % OP GEL
OPHTHALMIC | Status: AC
  Filled 2012-01-28: qty 5

## 2012-01-31 ENCOUNTER — Ambulatory Visit (HOSPITAL_COMMUNITY): Payer: Medicare Other | Admitting: Anesthesiology

## 2012-01-31 ENCOUNTER — Encounter (HOSPITAL_COMMUNITY): Payer: Self-pay | Admitting: Anesthesiology

## 2012-01-31 ENCOUNTER — Encounter (HOSPITAL_COMMUNITY)
Admission: RE | Disposition: A | Discharge status: Home / Self Care | Payer: Self-pay | Source: Ambulatory Visit | Attending: Ophthalmology

## 2012-01-31 ENCOUNTER — Ambulatory Visit (HOSPITAL_COMMUNITY)
Admission: RE | Admit: 2012-01-31 | Discharge: 2012-01-31 | Disposition: A | Discharge status: Home / Self Care | Payer: Medicare Other | Source: Ambulatory Visit | Attending: Ophthalmology | Admitting: Ophthalmology

## 2012-01-31 ENCOUNTER — Encounter (HOSPITAL_COMMUNITY): Payer: Self-pay | Admitting: Ophthalmology

## 2012-01-31 DIAGNOSIS — E119 Type 2 diabetes mellitus without complications: Secondary | ICD-10-CM | POA: Diagnosis not present

## 2012-01-31 DIAGNOSIS — H269 Unspecified cataract: Secondary | ICD-10-CM | POA: Diagnosis not present

## 2012-01-31 DIAGNOSIS — Z79899 Other long term (current) drug therapy: Secondary | ICD-10-CM | POA: Diagnosis not present

## 2012-01-31 DIAGNOSIS — H2589 Other age-related cataract: Secondary | ICD-10-CM | POA: Diagnosis not present

## 2012-01-31 DIAGNOSIS — I10 Essential (primary) hypertension: Secondary | ICD-10-CM | POA: Diagnosis not present

## 2012-01-31 HISTORY — PX: CATARACT EXTRACTION W/PHACO: SHX586

## 2012-01-31 SURGERY — PHACOEMULSIFICATION, CATARACT, WITH IOL INSERTION
Anesthesia: Monitor Anesthesia Care | Site: Eye | Laterality: Left | Wound class: Clean

## 2012-01-31 MED ORDER — MIDAZOLAM HCL 2 MG/2ML IJ SOLN
1.0000 mg | INTRAMUSCULAR | Status: DC | PRN
  Administered 2012-01-31: 2 mg via INTRAVENOUS

## 2012-01-31 MED ORDER — EPINEPHRINE HCL 1 MG/ML IJ SOLN
INTRAOCULAR | Status: DC | PRN
  Administered 2012-01-31: 13:00:00

## 2012-01-31 MED ORDER — CYCLOPENTOLATE-PHENYLEPHRINE 0.2-1 % OP SOLN
1.0000 [drp] | OPHTHALMIC | Status: AC
  Administered 2012-01-31 (×3): 1 [drp] via OPHTHALMIC

## 2012-01-31 MED ORDER — POVIDONE-IODINE 5 % OP SOLN
OPHTHALMIC | Status: DC | PRN
  Administered 2012-01-31: 1 via OPHTHALMIC

## 2012-01-31 MED ORDER — LIDOCAINE 3.5 % OP GEL OPTIME - NO CHARGE
OPHTHALMIC | Status: DC | PRN
  Administered 2012-01-31: 1 [drp] via OPHTHALMIC

## 2012-01-31 MED ORDER — LIDOCAINE HCL (PF) 1 % IJ SOLN
INTRAMUSCULAR | Status: DC | PRN
  Administered 2012-01-31: .2 mL

## 2012-01-31 MED ORDER — LIDOCAINE HCL 3.5 % OP GEL: 1.0000 "application " | Freq: Once | OPHTHALMIC | Status: DC

## 2012-01-31 MED ORDER — LACTATED RINGERS IV SOLN
INTRAVENOUS | Status: DC
  Administered 2012-01-31: 12:00:00 via INTRAVENOUS

## 2012-01-31 MED ORDER — BSS IO SOLN
INTRAOCULAR | Status: DC | PRN
  Administered 2012-01-31: 15 mL via INTRAOCULAR

## 2012-01-31 MED ORDER — EPINEPHRINE HCL 1 MG/ML IJ SOLN
INTRAMUSCULAR | Status: AC
  Filled 2012-01-31: qty 1

## 2012-01-31 MED ORDER — PROVISC 10 MG/ML IO SOLN
INTRAOCULAR | Status: DC | PRN
  Administered 2012-01-31: 8.5 mg via INTRAOCULAR

## 2012-01-31 MED ORDER — NEOMYCIN-POLYMYXIN-DEXAMETH 0.1 % OP OINT
TOPICAL_OINTMENT | OPHTHALMIC | Status: DC | PRN
  Administered 2012-01-31: 1 via OPHTHALMIC

## 2012-01-31 MED ORDER — PHENYLEPHRINE HCL 2.5 % OP SOLN
1.0000 [drp] | OPHTHALMIC | Status: AC
  Administered 2012-01-31 (×3): 1 [drp] via OPHTHALMIC

## 2012-01-31 MED ORDER — GLYCOPYRROLATE 0.2 MG/ML IJ SOLN
0.2000 mg | Freq: Once | INTRAMUSCULAR | Status: AC
  Administered 2012-01-31: 0.2 mg via INTRAVENOUS

## 2012-01-31 MED ORDER — MIDAZOLAM HCL 2 MG/2ML IJ SOLN
INTRAMUSCULAR | Status: AC
  Administered 2012-01-31: 2 mg via INTRAVENOUS
  Filled 2012-01-31: qty 2

## 2012-01-31 MED ORDER — LACTATED RINGERS IV SOLN
INTRAVENOUS | Status: DC | PRN
  Administered 2012-01-31: 11:00:00 via INTRAVENOUS

## 2012-01-31 MED ORDER — GLYCOPYRROLATE 0.2 MG/ML IJ SOLN
INTRAMUSCULAR | Status: AC
  Administered 2012-01-31: 0.2 mg via INTRAVENOUS
  Filled 2012-01-31: qty 1

## 2012-01-31 MED ORDER — TETRACAINE HCL 0.5 % OP SOLN
1.0000 [drp] | OPHTHALMIC | Status: AC
  Administered 2012-01-31 (×3): 1 [drp] via OPHTHALMIC

## 2012-01-31 SURGICAL SUPPLY — 32 items
CAPSULAR TENSION RING-AMO (OPHTHALMIC RELATED) IMPLANT
CLOTH BEACON ORANGE TIMEOUT ST (SAFETY) ×1 IMPLANT
EYE SHIELD UNIVERSAL CLEAR (GAUZE/BANDAGES/DRESSINGS) ×2 IMPLANT
GLOVE BIO SURGEON STRL SZ 6.5 (GLOVE) IMPLANT
GLOVE BIOGEL PI IND STRL 6.5 (GLOVE) IMPLANT
GLOVE BIOGEL PI IND STRL 7.0 (GLOVE) IMPLANT
GLOVE BIOGEL PI IND STRL 7.5 (GLOVE) IMPLANT
GLOVE BIOGEL PI INDICATOR 6.5 (GLOVE) ×1
GLOVE BIOGEL PI INDICATOR 7.0 (GLOVE)
GLOVE BIOGEL PI INDICATOR 7.5 (GLOVE)
GLOVE ECLIPSE 6.5 STRL STRAW (GLOVE) IMPLANT
GLOVE ECLIPSE 7.0 STRL STRAW (GLOVE) IMPLANT
GLOVE ECLIPSE 7.5 STRL STRAW (GLOVE) IMPLANT
GLOVE EXAM NITRILE LRG STRL (GLOVE) IMPLANT
GLOVE EXAM NITRILE MD LF STRL (GLOVE) ×1 IMPLANT
GLOVE SKINSENSE NS SZ6.5 (GLOVE)
GLOVE SKINSENSE NS SZ7.0 (GLOVE)
GLOVE SKINSENSE STRL SZ6.5 (GLOVE) IMPLANT
GLOVE SKINSENSE STRL SZ7.0 (GLOVE) IMPLANT
KIT VITRECTOMY (OPHTHALMIC RELATED) IMPLANT
PAD ARMBOARD 7.5X6 YLW CONV (MISCELLANEOUS) ×1 IMPLANT
PROC W NO LENS (INTRAOCULAR LENS)
PROC W SPEC LENS (INTRAOCULAR LENS)
PROCESS W NO LENS (INTRAOCULAR LENS) IMPLANT
PROCESS W SPEC LENS (INTRAOCULAR LENS) IMPLANT
RING MALYGIN (MISCELLANEOUS) IMPLANT
SIGHTPATH CAT PROC W REG LENS (Ophthalmic Related) ×2 IMPLANT
SYR TB 1ML LL NO SAFETY (SYRINGE) ×1 IMPLANT
TAPE SURG TRANSPORE 1 IN (GAUZE/BANDAGES/DRESSINGS) IMPLANT
TAPE SURGICAL TRANSPORE 1 IN (GAUZE/BANDAGES/DRESSINGS) ×1
VISCOELASTIC ADDITIONAL (OPHTHALMIC RELATED) IMPLANT
WATER STERILE IRR 250ML POUR (IV SOLUTION) ×1 IMPLANT

## 2012-01-31 NOTE — Preoperative (Signed)
Beta Blockers   Reason not to administer Beta Blockers:Not Applicable 

## 2012-01-31 NOTE — Transfer of Care (Signed)
Immediate Anesthesia Transfer of Care Note  Patient: [name] [name]  Procedure(s) Performed: Procedure(s) (LRB): CATARACT EXTRACTION PHACO AND INTRAOCULAR LENS PLACEMENT (IOC) (Left)  Patient Location: PACU  Anesthesia Type: MAC  Level of Consciousness: awake, alert , oriented and patient cooperative  Airway & Oxygen Therapy: Patient Spontanous Breathing  Post-op Assessment: Report given to PACU RN  Post vital signs: Reviewed and stable  Complications: No apparent anesthesia complications

## 2012-01-31 NOTE — Anesthesia Postprocedure Evaluation (Signed)
  Anesthesia Post-op Note  Patient: [name] [name]  Procedure(s) Performed: Procedure(s) (LRB): CATARACT EXTRACTION PHACO AND INTRAOCULAR LENS PLACEMENT (IOC) (Left)  Patient Location: PACU  Anesthesia Type: MAC  Level of Consciousness: awake, alert , oriented and patient cooperative  Airway and Oxygen Therapy: Patient Spontanous Breathing  Post-op Pain: none  Post-op Assessment: Post-op Vital signs reviewed, Patient's Cardiovascular Status Stable, Respiratory Function Stable and Patent Airway  Post-op Vital Signs: Reviewed and stable  Complications: No apparent anesthesia complications

## 2012-01-31 NOTE — Brief Op Note (Signed)
Pre-Op Dx: Cataract OS Post-Op Dx: Cataract OS Surgeon: Kamron Vanwyhe Anesthesia: Topical with MAC Surgery: Cataract Extraction with Intraocular lens Implant OS Implant: Lenstec, Model Softec HD Specimen: None Complications: None 

## 2012-01-31 NOTE — H&P (Signed)
I have reviewed the H&P, the patient was re-examined, and I have identified no interval changes in medical condition and plan of care since the history and physical of record  

## 2012-01-31 NOTE — Anesthesia Preprocedure Evaluation (Signed)
Anesthesia Evaluation  Patient identified by MRN, date of birth, ID band Patient awake    Reviewed: Allergy & Precautions, H&P , NPO status , Patient's Chart, lab work & pertinent test results  History of Anesthesia Complications (+) PONV  Airway Mallampati: II      Dental  (+) Edentulous Upper and Edentulous Lower   Pulmonary neg pulmonary ROS,  breath sounds clear to auscultation  Pulmonary exam normal       Cardiovascular hypertension, Pt. on home beta blockers and Pt. on medications + angina + CAD and + Past MI Rhythm:Regular Rate:Normal     Neuro/Psych PSYCHIATRIC DISORDERS negative neurological ROS     GI/Hepatic   Endo/Other  Diabetes mellitus-, Well Controlled, Type 2, Oral Hypoglycemic AgentsMorbid obesity  Renal/GU      Musculoskeletal   Abdominal (+) + obese,  Abdomen: soft.    Peds  Hematology   Anesthesia Other Findings   Reproductive/Obstetrics                           Anesthesia Physical Anesthesia Plan  ASA: II  Anesthesia Plan: MAC   Post-op Pain Management:    Induction: Intravenous  Airway Management Planned: Nasal Cannula  Additional Equipment:   Intra-op Plan:   Post-operative Plan:   Informed Consent: I have reviewed the patients History and Physical, chart, labs and discussed the procedure including the risks, benefits and alternatives for the proposed anesthesia with the patient or authorized representative who has indicated his/her understanding and acceptance.     Plan Discussed with: CRNA  Anesthesia Plan Comments:         Anesthesia Quick Evaluation

## 2012-01-31 NOTE — Anesthesia Procedure Notes (Signed)
Procedure Name: MAC Date/Time: 01/31/2012 12:52 PM Performed by: Carolyne Littles, Jj Enyeart L Pre-anesthesia Checklist: Patient identified, Patient being monitored, Emergency Drugs available, Timeout performed and Suction available Patient Re-evaluated:Patient Re-evaluated prior to inductionOxygen Delivery Method: Nasal cannula

## 2012-02-01 NOTE — Op Note (Signed)
NAMEFELISHIA, [name] [name]                  ACCOUNT NO.:  [id number]  MEDICAL RECORD NO.:  [id number]  LOCATION:  [facility]                          FACILITY:  [facility]  PHYSICIAN:  Susanne Greenhouse, MD       DATE OF BIRTH:  09-26-1935  DATE OF PROCEDURE:  01/31/2012 DATE OF DISCHARGE:  01/31/2012                              OPERATIVE REPORT   PREOPERATIVE DIAGNOSIS:  Combined cataract, left eye, diagnosis code 366.19.  POSTOPERATIVE DIAGNOSIS:  Combined cataract, left eye, diagnosis code 366.19.  OPERATION PERFORMED:  Phacoemulsification with posterior chamber intraocular lens implantation, left eye.  SURGEON:  Bonne Dolores. Pierre Dellarocco, MD  ANESTHESIA:  Topical with IV sedation and monitored anesthesia care.  OPERATIVE SUMMARY:  In the preoperative area, dilating drops were placed into the left eye.  The patient was then brought into the operating room where he was placed under general anesthesia.  The eye was then prepped and draped.  Beginning with a 75 blade, a paracentesis port was made at the surgeon's 2 o'clock position.  The anterior chamber was then filled with a 1% nonpreserved lidocaine solution with epinephrine.  This was followed by Viscoat to deepen the chamber.  A small fornix-based peritomy was performed superiorly.  Next, a single iris hook was placed through the limbus superiorly.  A 2.4-mm keratome blade was then used to make a clear corneal incision over the iris hook.  A bent cystotome needle and Utrata forceps were used to create a continuous tear capsulotomy.  Hydrodissection was performed using balanced salt solution on a fine cannula.  The lens nucleus was then removed using phacoemulsification in a quadrant cracking technique.  The cortical material was then removed with irrigation and aspiration.  The capsular bag and anterior chamber were refilled with Provisc.  The wound was widened to approximately 3 mm and a posterior chamber intraocular lens was placed into the capsular bag  without difficulty using an [employer] lens injecting system.  A single 10-0 nylon suture was then used to close the incision as well as stromal hydration.  The Provisc was removed from the anterior chamber and capsular bag with irrigation and aspiration.  At this point, the wounds were tested for leak, which were negative.  The anterior chamber remained deep and stable.  The patient tolerated the procedure well.  There were no operative complications, and he awoke from general anesthesia without problem.  No surgical specimens.  Prosthetic device used is a Lenstec posterior chamber lens, model Softec HD, power of 20.0, serial number is [id number].          ______________________________ Susanne Greenhouse, MD     KEH/MEDQ  D:  01/31/2012  T:  02/01/2012  Job:  [id number]

## 2012-02-02 ENCOUNTER — Encounter (HOSPITAL_COMMUNITY): Payer: Self-pay | Admitting: Ophthalmology

## 2012-03-17 ENCOUNTER — Other Ambulatory Visit: Payer: Self-pay | Admitting: Cardiovascular Disease

## 2012-04-17 DIAGNOSIS — Z23 Encounter for immunization: Secondary | ICD-10-CM | POA: Diagnosis not present

## 2012-04-17 DIAGNOSIS — E785 Hyperlipidemia, unspecified: Secondary | ICD-10-CM | POA: Diagnosis not present

## 2012-04-17 DIAGNOSIS — I10 Essential (primary) hypertension: Secondary | ICD-10-CM | POA: Diagnosis not present

## 2012-04-17 DIAGNOSIS — E119 Type 2 diabetes mellitus without complications: Secondary | ICD-10-CM | POA: Diagnosis not present

## 2012-05-29 ENCOUNTER — Other Ambulatory Visit: Payer: Self-pay | Admitting: Cardiology

## 2012-05-29 ENCOUNTER — Other Ambulatory Visit: Payer: Self-pay | Admitting: Cardiovascular Disease

## 2012-05-29 DIAGNOSIS — I6529 Occlusion and stenosis of unspecified carotid artery: Secondary | ICD-10-CM

## 2012-06-05 ENCOUNTER — Encounter: Payer: Self-pay | Admitting: Cardiovascular Disease

## 2012-06-05 ENCOUNTER — Encounter (INDEPENDENT_AMBULATORY_CARE_PROVIDER_SITE_OTHER): Payer: Medicare Other

## 2012-06-05 ENCOUNTER — Ambulatory Visit (INDEPENDENT_AMBULATORY_CARE_PROVIDER_SITE_OTHER): Payer: Medicare Other | Admitting: Cardiovascular Disease

## 2012-06-05 VITALS — BP 177/75 | HR 75 | Wt 229.0 lb

## 2012-06-05 DIAGNOSIS — I6529 Occlusion and stenosis of unspecified carotid artery: Secondary | ICD-10-CM | POA: Diagnosis not present

## 2012-06-05 DIAGNOSIS — I359 Nonrheumatic aortic valve disorder, unspecified: Secondary | ICD-10-CM | POA: Diagnosis not present

## 2012-06-05 DIAGNOSIS — I350 Nonrheumatic aortic (valve) stenosis: Secondary | ICD-10-CM

## 2012-06-05 NOTE — Patient Instructions (Signed)
Your physician wants you to follow-up in:  6 MONTHS WITH DR Eden Emms AND HAVE CAROTID AND ECHO SAME DAY  You will receive a reminder letter in the mail two months in advance. If you don't receive a letter, please call our office to schedule the follow-up appointment.  Your physician recommends that you continue on your current medications as directed. Please refer to the Current Medication list given to you today.  Your physician has requested that you have an echocardiogram. Echocardiography is a painless test that uses sound waves to create images of your heart. It provides your doctor with information about the size and shape of your heart and how well your heart's chambers and valves are working. This procedure takes approximately one hour. There are no restrictions for this procedure.  IN 6 MONTHS  Your physician has requested that you have a carotid duplex. This test is an ultrasound of the carotid arteries in your neck. It looks at blood flow through these arteries that supply the brain with blood. Allow one hour for this exam. There are no restrictions or special instructions.  IN 6 MONTHS  You have been referred to DR DIXON   SECOND OPINION ON CAROTIDS

## 2012-06-05 NOTE — Assessment & Plan Note (Signed)
Well controlled.  Continue current medications and low sodium Dash type diet.    

## 2012-06-05 NOTE — Assessment & Plan Note (Signed)
Discussed at length including relationship to her vascular disease No motivation to quit

## 2012-06-05 NOTE — Progress Notes (Signed)
Patient ID: [name] [name], female   DOB: [birth year], 76 y.o.   MRN: [id number] [name] [name] returns today for followup. She has had previous stenting of the right coronary artery. She has had resting tachycardia. We started her on beta-blockers and she improved. We did not find a good reason forher tachycardia, although she is diabetic and may have some autonomic dysfunction. She has good LV function. She has bilateral carotid disease. I reviewed duplex from today Previously was concerned since her systolic velocities were greater than 37m/sec and there was lots of calcium These were at the high end. F/U CTA also reviewed and indicated less than 80% disease and Dr Edilia Bo . I spoke with him personally about the decidsion making process in this case There have been no TIA like symptoms. Duplex today was stable with bilateral 60-79% stenosis. F/U duplex then May 2014   She has smoked since age 67. She is not motivated to quit. I counseled her for less than 10 minutes She had previously tried patches 2 years agao with limited success and they were too expensive. Willing to try Welbutrin but not Chantix . Her BS has been reasonably controlled despite a poor diet.   ROS: Denies fever, malais, weight loss, blurry vision, decreased visual acuity, cough, sputum, SOB, hemoptysis, pleuritic pain, palpitaitons, heartburn, abdominal pain, melena, lower extremity edema, claudication, or rash.  All other systems reviewed and negative  General: Affect appropriate Healthy:  appears stated age HEENT: normal Neck supple with no adenopathy JVP normal bilateral  bruits no thyromegaly Lungs clear with no wheezing and good diaphragmatic motion Heart:  S1/S2 mild murmur, no rub, gallop or click PMI normal Abdomen: benighn, BS positve, no tenderness, no AAA no bruit.  No HSM or HJR Distal pulses intact with no bruits Trace bilateral edema Neuro non-focal Skin warm and dry No muscular weakness   Current Outpatient Prescriptions   Medication Sig Dispense Refill  . ALPRAZolam (XANAX) 0.25 MG tablet prn      . atorvastatin (LIPITOR) 80 MG tablet Take 1 tablet (80 mg total) by mouth daily.  30 tablet  6  . buPROPion (WELLBUTRIN XL) 150 MG 24 hr tablet Take 1 tablet (150 mg total) by mouth 2 (two) times daily.  60 tablet  4  . cilostazol (PLETAL) 100 MG tablet Take 100 mg by mouth 2 (two) times daily.      . clopidogrel (PLAVIX) 75 MG tablet TAKE 1 TABLET BY MOUTH DAILY  30 tablet  6  . glimepiride (AMARYL) 2 MG tablet Take 2 mg by mouth daily before breakfast.        . linagliptin (TRADJENTA) 5 MG TABS tablet Take 5 mg by mouth daily.      Marland Kitchen lisinopril-hydrochlorothiazide (PRINZIDE,ZESTORETIC) 20-12.5 MG per tablet TAKE 1 TABLET BY MOUTH DAILY.  90 tablet  3  . metoprolol succinate (TOPROL-XL) 50 MG 24 hr tablet Take 75 mg by mouth 2 (two) times daily.      . nitroGLYCERIN (NITROSTAT) 0.4 MG SL tablet Place 0.4 mg under the tongue every 5 (five) minutes as needed.          Allergies  Review of patient's allergies indicates no known allergies.  Electrocardiogram:  Assessment and Plan

## 2012-06-05 NOTE — Assessment & Plan Note (Signed)
Cholesterol is at goal.  Continue current dose of statin and diet Rx.  No myalgias or side effects.  F/U  LFT's in 6 months. No results found for this basename: LDLCALC             

## 2012-06-05 NOTE — Assessment & Plan Note (Signed)
Stable with no angina and good activity level.  Continue medical Rx  

## 2012-06-05 NOTE — Assessment & Plan Note (Signed)
Refer back to Dr Edilia Bo systolic velocity on left is 3.09 and diastolic is .93 No TIA Continue ASA

## 2012-06-09 ENCOUNTER — Other Ambulatory Visit: Payer: Self-pay | Admitting: *Deleted

## 2012-06-09 MED ORDER — ATORVASTATIN CALCIUM 80 MG PO TABS: 80.0000 mg | ORAL_TABLET | Freq: Every day | ORAL | Status: DC

## 2012-07-24 DIAGNOSIS — E785 Hyperlipidemia, unspecified: Secondary | ICD-10-CM | POA: Diagnosis not present

## 2012-07-24 DIAGNOSIS — I10 Essential (primary) hypertension: Secondary | ICD-10-CM | POA: Diagnosis not present

## 2012-07-24 DIAGNOSIS — E119 Type 2 diabetes mellitus without complications: Secondary | ICD-10-CM | POA: Diagnosis not present

## 2012-07-24 DIAGNOSIS — N189 Chronic kidney disease, unspecified: Secondary | ICD-10-CM | POA: Diagnosis not present

## 2012-08-01 ENCOUNTER — Encounter: Payer: Self-pay | Admitting: Vascular Surgery

## 2012-08-02 ENCOUNTER — Ambulatory Visit (INDEPENDENT_AMBULATORY_CARE_PROVIDER_SITE_OTHER): Payer: Medicare Other | Admitting: Vascular Surgery

## 2012-08-02 ENCOUNTER — Encounter: Payer: Self-pay | Admitting: Vascular Surgery

## 2012-08-02 VITALS — BP 152/54 | HR 76 | Ht 61.0 in | Wt 239.8 lb

## 2012-08-02 DIAGNOSIS — I6529 Occlusion and stenosis of unspecified carotid artery: Secondary | ICD-10-CM

## 2012-08-02 NOTE — Progress Notes (Signed)
[facility]  Patient name: [name] [name] MRN: [id number] DOB: January 12, 1936 Sex: female  REASON FOR VISIT: follow up of bilateral carotid disease.  HPI: [name] [name] is a 77 y.o. female who I seen back in 2011 with bilateral carotid disease. She had a recent follow up carotid duplex scan done at the [facility] office which shows bilateral carotid disease and was sent for vascular follow up. She is right-handed. She denies any history of stroke, TIAs, expressive or receptive aphasia. There has been no significant changes in her medical history. She does continue to smoke half a pack per day.  I have reviewed her records from Dr. Eden Emms office. She has hyperlipidemia and her cholesterol is at goal. Her hypertension is well controlled. Dr. Eden Emms has discussed tobacco cessation with her.  Past Medical History  Diagnosis Date  . Bruit   . Hyperlipidemia   . HTN (hypertension)   . CAD   . PVD   . VENOUS INSUFFICIENCY   . DM   . OBESITY   . Edema   . PONV (postoperative nausea and vomiting)   . Angina 1995  . Carotid artery occlusion   . Myocardial infarction 1995    Family History  Problem Relation Age of Onset  . Anesthesia problems Neg Hx   . Hypotension Neg Hx   . Malignant hyperthermia Neg Hx   . Pseudochol deficiency Neg Hx     SOCIAL HISTORY: History  Substance Use Topics  . Smoking status: Current Every Day Smoker -- 0.5 packs/day for 53 years    Types: Cigarettes  . Smokeless tobacco: Never Used     Comment: smoker since age 64  . Alcohol Use: No    No Known Allergies  Current Outpatient Prescriptions  Medication Sig Dispense Refill  . ALPRAZolam (XANAX) 0.25 MG tablet prn      . atorvastatin (LIPITOR) 80 MG tablet Take 1 tablet (80 mg total) by mouth daily.  30 tablet  6  . buPROPion (WELLBUTRIN XL) 150 MG 24 hr tablet Take 1 tablet (150 mg total) by mouth 2 (two) times daily.  60 tablet  4  . cilostazol (PLETAL) 100 MG tablet Take 100 mg  by mouth 2 (two) times daily.      . clopidogrel (PLAVIX) 75 MG tablet TAKE 1 TABLET BY MOUTH DAILY  30 tablet  6  . glimepiride (AMARYL) 2 MG tablet Take 2 mg by mouth daily before breakfast.        . linagliptin (TRADJENTA) 5 MG TABS tablet Take 5 mg by mouth daily.      Marland Kitchen lisinopril-hydrochlorothiazide (PRINZIDE,ZESTORETIC) 20-12.5 MG per tablet TAKE 1 TABLET BY MOUTH DAILY.  90 tablet  3  . metoprolol succinate (TOPROL-XL) 50 MG 24 hr tablet Take 75 mg by mouth 2 (two) times daily.      . nitroGLYCERIN (NITROSTAT) 0.4 MG SL tablet Place 0.4 mg under the tongue every 5 (five) minutes as needed.          REVIEW OF SYSTEMS: Arly.Keller ] denotes positive finding; [  ] denotes negative finding  CARDIOVASCULAR:  [ ]  chest pain   [ ]  chest pressure   [ ]  palpitations   [ ]  orthopnea   [ ]  dyspnea on exertion   [ ]  claudication   [ ]  rest pain   Arly.Keller ] DVT   [ ]  phlebitis PULMONARY:   [ ]  productive cough   [ ]  asthma   [ ]   wheezing NEUROLOGIC:   [ ]  weakness  [ ]  paresthesias  [ ]  aphasia  [ ]  amaurosis  [ ]  dizziness HEMATOLOGIC:   [ ]  bleeding problems   [ ]  clotting disorders MUSCULOSKELETAL:  [ ]  joint pain   [ ]  joint swelling Arly.Keller ] leg swelling GASTROINTESTINAL: [ ]   blood in stool  [ ]   hematemesis GENITOURINARY:  [ ]   dysuria  [ ]   hematuria PSYCHIATRIC:  [ ]  history of major depression INTEGUMENTARY:  [ ]  rashes  [ ]  ulcers CONSTITUTIONAL:  [ ]  fever   [ ]  chills  PHYSICAL EXAM: Filed Vitals:   08/02/12 1015 08/02/12 1016  BP: 145/55 152/54  Pulse: 76   Height: 5\' 1"  (1.549 m)   Weight: 239 lb 12.8 oz (108.773 kg)   SpO2: 99%    Body mass index is 45.31 kg/(m^2). GENERAL: The patient is a well-nourished female, in no acute distress. The vital signs are documented above. CARDIOVASCULAR: There is a regular rate and rhythm. She has bilateral carotid bruits. She has palpable radial pulses bilaterally. She has no significant lower extremity swelling. PULMONARY: There is good air exchange  bilaterally without wheezing or rales. ABDOMEN: Soft and non-tender with normal pitched bowel sounds.  MUSCULOSKELETAL: There are no major deformities or cyanosis. NEUROLOGIC: No focal weakness or paresthesias are detected. SKIN: There are no ulcers or rashes noted. PSYCHIATRIC: The patient has a normal affect.  DATA:  I reviewed her duplex scan from the [facility] office. By velocity criteria she has a 40-59% right carotid stenosis and a 60-79% left carotid stenosis. Both vertebral arteries are patent with normally directed flow.  MEDICAL ISSUES:  CAROTID ARTERY DISEASE This patient has an asymptomatic 60-79% left carotid stenosis with a 40-59% right carotid stenosis. I would not recommend carotid endarterectomy unless the stenosis on the left at progressed to greater than 80% or she developed new neurologic symptoms. She has 6 month interval carotid duplex scans done at the [facility] office and she will continue with these. I be happy to see her back if she has progression of her disease or develops new neurologic symptoms. We have also discussed the importance of tobacco cessation. She is on Plavix and knows to continue this.   Sylar Voong S [facility] [place]: [phone]

## 2012-08-02 NOTE — Assessment & Plan Note (Signed)
This patient has an asymptomatic 60-79% left carotid stenosis with a 40-59% right carotid stenosis. I would not recommend carotid endarterectomy unless the stenosis on the left at progressed to greater than 80% or she developed new neurologic symptoms. She has 6 month interval carotid duplex scans done at the [facility] office and she will continue with these. I be happy to see her back if she has progression of her disease or develops new neurologic symptoms. We have also discussed the importance of tobacco cessation. She is on Plavix and knows to continue this.

## 2012-08-14 ENCOUNTER — Other Ambulatory Visit: Payer: Self-pay | Admitting: Cardiovascular Disease

## 2012-08-14 MED ORDER — METOPROLOL SUCCINATE ER 50 MG PO TB24: 75.0000 mg | ORAL_TABLET | Freq: Two times a day (BID) | ORAL | Status: DC

## 2012-08-17 ENCOUNTER — Other Ambulatory Visit: Payer: Self-pay | Admitting: *Deleted

## 2012-08-17 MED ORDER — METOPROLOL SUCCINATE ER 50 MG PO TB24: ORAL_TABLET | ORAL | Status: DC

## 2012-10-09 ENCOUNTER — Other Ambulatory Visit: Payer: Self-pay | Admitting: *Deleted

## 2012-10-09 MED ORDER — CLOPIDOGREL BISULFATE 75 MG PO TABS: ORAL_TABLET | ORAL | Status: DC

## 2012-10-20 ENCOUNTER — Other Ambulatory Visit: Payer: Self-pay

## 2012-10-20 MED ORDER — GLIMEPIRIDE 2 MG PO TABS: 2.0000 mg | ORAL_TABLET | Freq: Every day | ORAL | Status: DC

## 2012-10-30 ENCOUNTER — Encounter: Payer: Self-pay | Admitting: Family Medicine

## 2012-10-30 ENCOUNTER — Ambulatory Visit (INDEPENDENT_AMBULATORY_CARE_PROVIDER_SITE_OTHER): Payer: Medicare Other | Admitting: Family Medicine

## 2012-10-30 VITALS — BP 160/79 | HR 80 | Temp 97.2°F | Ht 62.0 in | Wt 240.6 lb

## 2012-10-30 DIAGNOSIS — I2510 Atherosclerotic heart disease of native coronary artery without angina pectoris: Secondary | ICD-10-CM

## 2012-10-30 DIAGNOSIS — E119 Type 2 diabetes mellitus without complications: Secondary | ICD-10-CM

## 2012-10-30 DIAGNOSIS — E669 Obesity, unspecified: Secondary | ICD-10-CM | POA: Diagnosis not present

## 2012-10-30 DIAGNOSIS — I739 Peripheral vascular disease, unspecified: Secondary | ICD-10-CM | POA: Diagnosis not present

## 2012-10-30 DIAGNOSIS — E785 Hyperlipidemia, unspecified: Secondary | ICD-10-CM | POA: Diagnosis not present

## 2012-10-30 DIAGNOSIS — F17200 Nicotine dependence, unspecified, uncomplicated: Secondary | ICD-10-CM

## 2012-10-30 DIAGNOSIS — I10 Essential (primary) hypertension: Secondary | ICD-10-CM | POA: Diagnosis not present

## 2012-10-30 LAB — HEPATIC FUNCTION PANEL
ALT: 10 U/L (ref 0–35)
AST: 13 U/L (ref 0–37)
Albumin: 3.8 g/dL (ref 3.5–5.2)
Alkaline Phosphatase: 122 U/L — ABNORMAL HIGH (ref 39–117)
Bilirubin, Direct: 0.1 mg/dL (ref 0.0–0.3)
Indirect Bilirubin: 0.6 mg/dL (ref 0.0–0.9)
Total Bilirubin: 0.7 mg/dL (ref 0.3–1.2)
Total Protein: 6.8 g/dL (ref 6.0–8.3)

## 2012-10-30 LAB — BASIC METABOLIC PANEL WITH GFR
BUN: 20 mg/dL (ref 6–23)
CO2: 28 mEq/L (ref 19–32)
Calcium: 9.1 mg/dL (ref 8.4–10.5)
Chloride: 105 mEq/L (ref 96–112)
Creat: 1.55 mg/dL — ABNORMAL HIGH (ref 0.50–1.10)
GFR, Est African American: 38 mL/min — ABNORMAL LOW
GFR, Est Non African American: 33 mL/min — ABNORMAL LOW
Glucose, Bld: 105 mg/dL — ABNORMAL HIGH (ref 70–99)
Potassium: 4.3 mEq/L (ref 3.5–5.3)
Sodium: 141 mEq/L (ref 135–145)

## 2012-10-30 LAB — POCT GLYCOSYLATED HEMOGLOBIN (HGB A1C): Hemoglobin A1C: 6.3

## 2012-10-30 NOTE — Patient Instructions (Signed)
Diet and Exercise discussed with patient. For nutrition information, I recommend books: Eat to Live by Dr Monico Hoar. Prevent and Reverse Heart Disease by Dr Suzzette Righter.  Exercise recommendations are:  If unable to walk, then the patient can exercise in a chair 3 times a day. By flapping arms like a bird gently and raising legs outwards to the front.  If ambulatory, the patient can go for walks for 30 minutes 3 times a week. Then increase the intensity and duration as tolerated. Goal is to try to attain exercise frequency to 5 times a week. Best to perform resistance exercises 2 days a week and cardio type exercises 3 days per week.   Diabetes and Foot Care Diabetes may cause you to have a poor blood supply (circulation) to your legs and feet. Because of this, the skin may be thinner, break easier, and heal more slowly. You also may have nerve damage in your legs and feet causing decreased feeling. You may not notice minor injuries to your feet that could lead to serious problems or infections. Taking care of your feet is one of the most important things you can do for yourself.  HOME CARE INSTRUCTIONS  Do not go barefoot. Bare feet are easily injured.  Check your feet daily for blisters, cuts, and redness.  Wash your feet with warm water (not hot) and mild soap. Pat your feet and between your toes until completely dry.  Apply a moisturizing lotion that does not contain alcohol or petroleum jelly to the dry skin on your feet and to dry brittle toenails. Do not put it between your toes.  Trim your toenails straight across. Do not dig under them or around the cuticle.  Do not cut corns or calluses, or try to remove them with medicine.  Wear clean cotton socks or stockings every day. Make sure they are not too tight. Do not wear knee high stockings since they may decrease blood flow to your legs.  Wear leather shoes that fit properly and have enough cushioning. To break in new  shoes, wear them just a few hours a day to avoid injuring your feet.  Wear shoes at all times, even in the house.  Do not cross your legs. This may decrease the blood flow to your feet.  If you find a minor scrape, cut, or break in the skin on your feet, keep it and the skin around it clean and dry. These areas may be cleansed with mild soap and water. Do not use peroxide, alcohol, iodine or Merthiolate.  When you remove an adhesive bandage, be sure not to harm the skin around it.  If you have a wound, look at it several times a day to make sure it is healing.  Do not use heating pads or hot water bottles. Burns can occur. If you have lost feeling in your feet or legs, you may not know it is happening until it is too late.  Report any cuts, sores or bruises to your caregiver. Do not wait! SEEK MEDICAL CARE IF:   You have an injury that is not healing or you notice redness, numbness, burning, or tingling.  Your feet always feel cold.  You have pain or cramps in your legs and feet. SEEK IMMEDIATE MEDICAL CARE IF:   There is increasing redness, swelling, or increasing pain in the wound.  There is a red line that goes up your leg.  Pus is coming from a wound.  You develop an  unexplained oral temperature above 102 F (38.9 C), or as your caregiver suggests.  You notice a bad smell coming from an ulcer or wound. MAKE SURE YOU:   Understand these instructions.  Will watch your condition.  Will get help right away if you are not doing well or get worse. Document Released: 07/09/2000 Document Revised: 10/04/2011 Document Reviewed: 01/15/2009 [facility] Patient Information 2013 [place], [place]. Diabetes and Exercise Regular exercise is important and can help:   Control blood glucose (sugar).  Decrease blood pressure.    Control blood lipids (cholesterol, triglycerides).  Improve overall health. BENEFITS FROM EXERCISE  Improved fitness.  Improved flexibility.  Improved  endurance.  Increased bone density.  Weight control.  Increased muscle strength.  Decreased body fat.  Improvement of the body's use of insulin, a hormone.  Increased insulin sensitivity.  Reduction of insulin needs.  Reduced stress and tension.  Helps you feel better. People with diabetes who add exercise to their lifestyle gain additional benefits, including:  Weight loss.  Reduced appetite.  Improvement of the body's use of blood glucose.  Decreased risk factors for heart disease:  Lowering of cholesterol and triglycerides.  Raising the level of good cholesterol (high-density lipoproteins, HDL).  Lowering blood sugar.  Decreased blood pressure. TYPE 1 DIABETES AND EXERCISE  Exercise will usually lower your blood glucose.  If blood glucose is greater than 240 mg/dl, check urine ketones. If ketones are present, do not exercise.  Location of the insulin injection sites may need to be adjusted with exercise. Avoid injecting insulin into areas of the body that will be exercised. For example, avoid injecting insulin into:  The arms when playing tennis.  The legs when jogging. For more information, discuss this with your caregiver.  Keep a record of:  Food intake.  Type and amount of exercise.  Expected peak times of insulin action.  Blood glucose levels. Do this before, during, and after exercise. Review your records with your caregiver. This will help you to develop guidelines for adjusting food intake and insulin amounts.  TYPE 2 DIABETES AND EXERCISE  Regular physical activity can help control blood glucose.  Exercise is important because it may:  Increase the body's sensitivity to insulin.  Improve blood glucose control.  Exercise reduces the risk of heart disease. It decreases serum cholesterol and triglycerides. It also lowers blood pressure.  Those who take insulin or oral hypoglycemic agents should watch for signs of hypoglycemia. These signs  include dizziness, shaking, sweating, chills, and confusion.  Body water is lost during exercise. It must be replaced. This will help to avoid loss of body fluids (dehydration) or heat stroke. Be sure to talk to your caregiver before starting an exercise program to make sure it is safe for you. Remember, any activity is better than none.  Document Released: 10/02/2003 Document Revised: 10/04/2011 Document Reviewed: 01/16/2009 [facility] Patient Information 2013 [place], [place]. Diabetes, Type 2 Diabetes is a long-lasting (chronic) disease. In type 2 diabetes, the pancreas does not make enough insulin (a hormone), and the body does not respond normally to the insulin that is made. This type of diabetes was also previously called adult-onset diabetes. It usually occurs after the age of 24, but it can occur at any age.  CAUSES  Type 2 diabetes happens because the pancreasis not making enough insulin or your body has trouble using the insulin that your pancreas does make properly. SYMPTOMS   Drinking more than usual.  Urinating more than usual.  Blurred vision.  Dry, itchy skin.  Frequent infections.  Feeling more tired than usual (fatigue). DIAGNOSIS The diagnosis of type 2 diabetes is usually made by one of the following tests:  Fasting blood glucose test. You will not eat for at least 8 hours and then take a blood test.  Random blood glucose test. Your blood glucose (sugar) is checked at any time of the day regardless of when you ate.  Oral glucose tolerance test (OGTT). Your blood glucose is measured after you have not eaten (fasted) and then after you drink a glucose containing beverage. TREATMENT   Healthy eating.  Exercise.  Medicine, if needed.  Monitoring blood glucose.  Seeing your caregiver regularly. HOME CARE INSTRUCTIONS   Check your blood glucose at least once a day. More frequent monitoring may be necessary, depending on your medicines and on how well your  diabetes is controlled. Your caregiver will advise you.  Take your medicine as directed by your caregiver.  Do not smoke.  Make wise food choices. Ask your caregiver for information. Weight loss can improve your diabetes.  Learn about low blood glucose (hypoglycemia) and how to treat it.  Get your eyes checked regularly.  Have a yearly physical exam. Have your blood pressure checked and your blood and urine tested.  Wear a pendant or bracelet saying that you have diabetes.  Check your feet every night for cuts, sores, blisters, and redness. Let your caregiver know if you have any problems. SEEK MEDICAL CARE IF:   You have problems keeping your blood glucose in target range.  You have problems with your medicines.  You have symptoms of an illness that do not improve after 24 hours.  You have a sore or wound that is not healing.  You notice a change in vision or a new problem with your vision.  You have a fever. MAKE SURE YOU:  Understand these instructions.  Will watch your condition.  Will get help right away if you are not doing well or get worse. Document Released: 07/12/2005 Document Revised: 10/04/2011 Document Reviewed: 12/28/2010 [facility] Patient Information 2013 [place], [place].

## 2012-10-30 NOTE — Progress Notes (Signed)
Patient ID: [name] [name], female   DOB: 1936-03-01, 77 y.o.   MRN: [id number] SUBJECTIVE:  HPI:  Patient is here for follow up of Diabetes Mellitus/hyperlipidemia/hypertension/CAD/ Carotid artery disease.Symptoms of DM:has had no Nocturia ,deniesUrinary Frequency ,denies Blurred vision ,deniesDizziness,denies.Dysuria,deniesparesthesias, deniesextremity pain or ulcers.denieschest pain. .has not hadan annual eye exam. do check the feet. does notcheck CBGs. Average CBG:________.Marland Kitchen deniesto episodes of hypoglycemia. does nothave an emergency hypoglycemic plan. admits toCompliance with medications. deniesProblems with medications.   PMH/PSH: reviewed/updated in Epic  SH/FH: reviewed/updated in Epic  Allergies: reviewed/updated in Epic  Medications: reviewed/updated in Epic  Immunizations: reviewed/updated in Epic   ROS: As above in the HPI. All other systems are stable or negative.   OBJECTIVE: APPEARANCE:  Very obese Patient in no acute distress.The patient appeared well nourished and normally developed. Acyanotic.  Waist:  VITAL SIGNS:BP 160/79  Pulse 80  Temp(Src) 97.2 F (36.2 C) (Oral)  Ht 5\' 2"  (1.575 m)  Wt 240 lb 9.6 oz (109.135 kg)  BMI 43.99 kg/m2   SKIN: warm and  Dry without overt rashes, tattoos and scars  HEAD and Neck: without JVD, Normal No scleral icterus  CHEST & LUNGS: Clear  CVS: Reveals the PMI to be normally located. Regular rhythm, First and Second Heart sounds are normal, and absence of murmurs, rubs or gallops.  ABDOMEN:  Benign,, no organomegaly, no masses, no Abdominal Aortic enlargement. No Guarding , no rebound. No Bruits.  RECTAL:n/a  GU:m/a  EXTREMETIES: nonedematous. Both Femoral and Pedal pulses are normal.  MUSCULOSKELETAL:  Spine: normal Joints: intact  NEUROLOGIC: oriented to time,place and person; nonfocal. Strength is normal Sensory is normal Reflexes are normal Cranial Nerves are  normal.     ASSESSMENT:   Patient Active Problem List  Diagnosis  . DM  . HYPERLIPIDEMIA  . OBESITY  . SMOKER  . HYPERTENSION  . CAD  . CAROTID ARTERY DISEASE  . PVD  . VENOUS INSUFFICIENCY  . EDEMA     PLAN:  Orders Placed This Encounter  Procedures  . BASIC METABOLIC PANEL WITH GFR  . NMR Lipoprofile with Lipids  . Hepatic function panel  . POCT glycosylated hemoglobin (Hb A1C)   Results for orders placed in visit on 10/30/12 (from the past 24 hour(s))  POCT GLYCOSYLATED HEMOGLOBIN (HGB A1C)     Status: None   Collection Time    10/30/12  4:53 PM      Result Value Range   Hemoglobin A1C 6.3                       Meds ordered this encounter  Medications  . PATADAY 0.2 % SOLN    Sig:     Diet and Exercise discussed with patient. For nutrition information, I recommend books: Eat to Live by Dr Monico Hoar. Prevent and Reverse Heart Disease by Dr Suzzette Righter.  Exercise recommendations are:  If unable to walk, then the patient can exercise in a chair 3 times a day. By flapping arms like a bird gently and raising legs outwards to the front.  If ambulatory, the patient can go for walks for 30 minutes 3 times a week. Then increase the intensity and duration as tolerated. Goal is to try to attain exercise frequency to 5 times a week. Best to perform resistance exercises 2 days a week and cardio type exercises 3 days per week.  Discussed and counselled on smoking cessation.  Counselled on the need for dietary changes , exercise  and lifestyle changes.  RTCx 3months.  Gorje Iyer P. Modesto Charon, M.D.

## 2012-10-31 LAB — NMR LIPOPROFILE WITH LIPIDS
Cholesterol, Total: 130 mg/dL (ref <200)
HDL Particle Number: 32 umol/L (ref >=30.5)
HDL Size: 9 nm — ABNORMAL LOW (ref >=9.2)
HDL-C: 49 mg/dL (ref >=40)
LDL (calc): 52 mg/dL (ref <100)
LDL Particle Number: 910 nmol/L (ref <1000)
LDL Size: 19.9 nm — ABNORMAL LOW (ref >20.5)
LP-IR Score: 52 — ABNORMAL HIGH (ref <=45)
Large HDL-P: 5.6 umol/L (ref >=4.8)
Large VLDL-P: 2.3 nmol/L (ref <=2.7)
Small LDL Particle Number: 736 nmol/L — ABNORMAL HIGH (ref <=527)
Triglycerides: 143 mg/dL (ref <150)
VLDL Size: 49 nm — ABNORMAL HIGH (ref <=46.6)

## 2012-11-01 NOTE — Progress Notes (Signed)
Quick Note:  Lab result at goal. No change in Medications for now. ______

## 2012-11-06 ENCOUNTER — Other Ambulatory Visit: Payer: Self-pay | Admitting: *Deleted

## 2012-11-06 MED ORDER — CILOSTAZOL 100 MG PO TABS: 100.0000 mg | ORAL_TABLET | Freq: Two times a day (BID) | ORAL | Status: DC

## 2012-11-27 ENCOUNTER — Other Ambulatory Visit: Payer: Self-pay | Admitting: Family Medicine

## 2012-12-04 ENCOUNTER — Ambulatory Visit (HOSPITAL_COMMUNITY): Payer: Medicare Other | Attending: Cardiology | Admitting: Radiology

## 2012-12-04 DIAGNOSIS — E669 Obesity, unspecified: Secondary | ICD-10-CM | POA: Diagnosis not present

## 2012-12-04 DIAGNOSIS — I359 Nonrheumatic aortic valve disorder, unspecified: Secondary | ICD-10-CM | POA: Insufficient documentation

## 2012-12-04 DIAGNOSIS — F17200 Nicotine dependence, unspecified, uncomplicated: Secondary | ICD-10-CM | POA: Insufficient documentation

## 2012-12-04 DIAGNOSIS — I2510 Atherosclerotic heart disease of native coronary artery without angina pectoris: Secondary | ICD-10-CM | POA: Diagnosis not present

## 2012-12-04 DIAGNOSIS — I739 Peripheral vascular disease, unspecified: Secondary | ICD-10-CM | POA: Diagnosis not present

## 2012-12-04 DIAGNOSIS — E119 Type 2 diabetes mellitus without complications: Secondary | ICD-10-CM | POA: Diagnosis not present

## 2012-12-04 DIAGNOSIS — I10 Essential (primary) hypertension: Secondary | ICD-10-CM | POA: Diagnosis not present

## 2012-12-04 DIAGNOSIS — R609 Edema, unspecified: Secondary | ICD-10-CM | POA: Diagnosis not present

## 2012-12-04 DIAGNOSIS — I872 Venous insufficiency (chronic) (peripheral): Secondary | ICD-10-CM | POA: Insufficient documentation

## 2012-12-04 DIAGNOSIS — I350 Nonrheumatic aortic (valve) stenosis: Secondary | ICD-10-CM

## 2012-12-04 DIAGNOSIS — E785 Hyperlipidemia, unspecified: Secondary | ICD-10-CM | POA: Diagnosis not present

## 2012-12-04 NOTE — Progress Notes (Signed)
Echocardiogram performed.  

## 2012-12-12 ENCOUNTER — Encounter (INDEPENDENT_AMBULATORY_CARE_PROVIDER_SITE_OTHER): Payer: Medicare Other

## 2012-12-12 ENCOUNTER — Ambulatory Visit (INDEPENDENT_AMBULATORY_CARE_PROVIDER_SITE_OTHER): Payer: Medicare Other | Admitting: Cardiovascular Disease

## 2012-12-12 ENCOUNTER — Encounter: Payer: Self-pay | Admitting: Cardiovascular Disease

## 2012-12-12 VITALS — BP 155/69 | HR 69 | Ht 62.0 in | Wt 241.8 lb

## 2012-12-12 DIAGNOSIS — I2510 Atherosclerotic heart disease of native coronary artery without angina pectoris: Secondary | ICD-10-CM

## 2012-12-12 DIAGNOSIS — I6529 Occlusion and stenosis of unspecified carotid artery: Secondary | ICD-10-CM

## 2012-12-12 DIAGNOSIS — E119 Type 2 diabetes mellitus without complications: Secondary | ICD-10-CM

## 2012-12-12 DIAGNOSIS — I10 Essential (primary) hypertension: Secondary | ICD-10-CM

## 2012-12-12 DIAGNOSIS — E785 Hyperlipidemia, unspecified: Secondary | ICD-10-CM

## 2012-12-12 MED ORDER — CILOSTAZOL 100 MG PO TABS: 100.0000 mg | ORAL_TABLET | Freq: Two times a day (BID) | ORAL | Status: DC

## 2012-12-12 MED ORDER — NITROGLYCERIN 0.4 MG SL SUBL: 0.4000 mg | SUBLINGUAL_TABLET | SUBLINGUAL | Status: DC | PRN

## 2012-12-12 MED ORDER — METOPROLOL SUCCINATE ER 50 MG PO TB24: ORAL_TABLET | ORAL | Status: DC

## 2012-12-12 MED ORDER — LISINOPRIL-HYDROCHLOROTHIAZIDE 20-12.5 MG PO TABS: 1.0000 | ORAL_TABLET | Freq: Every day | ORAL | Status: DC

## 2012-12-12 MED ORDER — CLOPIDOGREL BISULFATE 75 MG PO TABS: ORAL_TABLET | ORAL | Status: DC

## 2012-12-12 MED ORDER — ATORVASTATIN CALCIUM 80 MG PO TABS: 80.0000 mg | ORAL_TABLET | Freq: Every day | ORAL | Status: DC

## 2012-12-12 MED ORDER — LINAGLIPTIN 5 MG PO TABS: 5.0000 mg | ORAL_TABLET | Freq: Every day | ORAL | Status: DC

## 2012-12-12 NOTE — Assessment & Plan Note (Signed)
Stable but significant.  ASA  F/U duplex 6 months. She understands that smoking is not good for her vascular disease but resigned to never quit

## 2012-12-12 NOTE — Assessment & Plan Note (Signed)
Well controlled.  Continue current medications and low sodium Dash type diet.    

## 2012-12-12 NOTE — Assessment & Plan Note (Signed)
Cholesterol is at goal.  Continue current dose of statin and diet Rx.  No myalgias or side effects.  F/U  LFT's in 6 months. Lab Results  Component Value Date   LDLCALC 52 10/30/2012

## 2012-12-12 NOTE — Patient Instructions (Signed)
Your physician wants you to follow-up in:   6 MONTHS WITH  DR   NISHAN  AND  CAROTID  SAME  DAY  You will receive a reminder letter in the mail two months in advance. If you don't receive a letter, please call our office to schedule the follow-up appointment. Your physician recommends that you continue on your current medications as directed. Please refer to the Current Medication list given to you today. 

## 2012-12-12 NOTE — Assessment & Plan Note (Signed)
Discussed low carb diet.  Target hemoglobin A1c is 6.5 or less.  Continue current medications.  

## 2012-12-12 NOTE — Progress Notes (Signed)
Patient ID: [name] [name], female   DOB: [birth year], 77 y.o.   MRN: [id number] [name] [name] returns today for followup. She has had previous stenting of the right coronary artery. She has had resting tachycardia. We started her on beta-blockers and she improved. We did not find a good reason forher tachycardia, although she is diabetic and may have some autonomic dysfunction. She has good LV function. She has bilateral carotid disease. I reviewed duplex from today Previously was concerned since her systolic velocities were greater than 52m/sec and there was lots of calcium These were at the high end. F/U CTA also reviewed and indicated less than 80% disease and Dr Edilia Bo . I spoke with him personally about the decidsion making process in this case There have been no TIA like symptoms. Duplex today was stable with bilateral 60-79% stenosis. F/U duplex then November 2014  She has smoked since age 5. She is not motivated to quit. I counseled her for less than 10 minutes She had previously tried patches 2 years agao with limited success and they were too expensive. Willing to try Welbutrin but not Chantix . Her BS has been reasonably controlled despite a poor diet.    Echo 5/12 normal EF 55% mild LVH  ROS: Denies fever, malais, weight loss, blurry vision, decreased visual acuity, cough, sputum, SOB, hemoptysis, pleuritic pain, palpitaitons, heartburn, abdominal pain, melena, lower extremity edema, claudication, or rash.  All other systems reviewed and negative  General: Affect appropriate Obese white female HEENT: normal Neck supple with no adenopathy JVP normal bilateral  bruits no thyromegaly Lungs clear with no wheezing and good diaphragmatic motion Heart:  S1/S2 no murmur, no rub, gallop or click PMI normal Abdomen: benighn, BS positve, no tenderness, no AAA no bruit.  No HSM or HJR Distal pulses intact with no bruits No edema Neuro non-focal Skin warm and dry No muscular weakness   Current  Outpatient Prescriptions  Medication Sig Dispense Refill  . ALPRAZolam (XANAX) 0.25 MG tablet prn      . atorvastatin (LIPITOR) 80 MG tablet Take 1 tablet (80 mg total) by mouth daily.  30 tablet  6  . cilostazol (PLETAL) 100 MG tablet Take 1 tablet (100 mg total) by mouth 2 (two) times daily.  60 tablet  3  . clopidogrel (PLAVIX) 75 MG tablet TAKE 1 TABLET BY MOUTH DAILY  30 tablet  6  . glimepiride (AMARYL) 2 MG tablet Take 1 tablet (2 mg total) by mouth daily before breakfast.  30 tablet  0  . lisinopril-hydrochlorothiazide (PRINZIDE,ZESTORETIC) 20-12.5 MG per tablet TAKE 1 TABLET BY MOUTH DAILY.  90 tablet  3  . metoprolol succinate (TOPROL-XL) 50 MG 24 hr tablet Take 75 mg by mouth 2 (two) times daily.  270 tablet  1  . nitroGLYCERIN (NITROSTAT) 0.4 MG SL tablet Place 0.4 mg under the tongue every 5 (five) minutes as needed.        Marland Kitchen PATADAY 0.2 % SOLN       . TRADJENTA 5 MG TABS tablet TAKE 1 TABLET BY MOUTH ONCE DAILY  30 tablet  1   No current facility-administered medications for this visit.    Allergies  Review of patient's allergies indicates no known allergies.  Electrocardiogram:  10/04/11  SR rate 79 normal   Assessment and Plan

## 2012-12-12 NOTE — Assessment & Plan Note (Signed)
Stable with no angina and good activity level.  Continue medical Rx  

## 2012-12-25 ENCOUNTER — Other Ambulatory Visit: Payer: Self-pay | Admitting: Family Medicine

## 2013-02-05 ENCOUNTER — Other Ambulatory Visit: Payer: Self-pay | Admitting: *Deleted

## 2013-02-05 MED ORDER — GLUCOSE BLOOD VI STRP: ORAL_STRIP | Status: DC

## 2013-02-19 ENCOUNTER — Other Ambulatory Visit: Payer: Self-pay | Admitting: Family Medicine

## 2013-03-01 ENCOUNTER — Encounter: Payer: Self-pay | Admitting: Family Medicine

## 2013-03-01 ENCOUNTER — Ambulatory Visit (INDEPENDENT_AMBULATORY_CARE_PROVIDER_SITE_OTHER): Payer: Medicare Other | Admitting: Family Medicine

## 2013-03-01 VITALS — BP 131/70 | HR 83 | Temp 97.3°F | Wt 244.2 lb

## 2013-03-01 DIAGNOSIS — E785 Hyperlipidemia, unspecified: Secondary | ICD-10-CM

## 2013-03-01 DIAGNOSIS — E119 Type 2 diabetes mellitus without complications: Secondary | ICD-10-CM

## 2013-03-01 DIAGNOSIS — I2510 Atherosclerotic heart disease of native coronary artery without angina pectoris: Secondary | ICD-10-CM

## 2013-03-01 DIAGNOSIS — I872 Venous insufficiency (chronic) (peripheral): Secondary | ICD-10-CM

## 2013-03-01 DIAGNOSIS — E669 Obesity, unspecified: Secondary | ICD-10-CM

## 2013-03-01 DIAGNOSIS — F17200 Nicotine dependence, unspecified, uncomplicated: Secondary | ICD-10-CM

## 2013-03-01 DIAGNOSIS — I10 Essential (primary) hypertension: Secondary | ICD-10-CM

## 2013-03-01 DIAGNOSIS — I6529 Occlusion and stenosis of unspecified carotid artery: Secondary | ICD-10-CM | POA: Diagnosis not present

## 2013-03-01 LAB — POCT GLYCOSYLATED HEMOGLOBIN (HGB A1C): Hemoglobin A1C: 6.4

## 2013-03-01 NOTE — Progress Notes (Signed)
Patient ID: [name] [name], female   DOB: 14-Apr-1936, 77 y.o.   MRN: [id number] SUBJECTIVE: CC: Chief Complaint  Patient presents with  . Follow-up    4 month follow up refill trajenta and glimeride     HPI: Patient is here for follow up of Diabetes Mellitus/HLD/HTN: Symptoms evaluated Denies Nocturia ,Denies Urinary Frequency , denies Blurred vision ,deniesDizziness,denies.Dysuria,denies paresthesias, denies extremity pain or ulcers.Marland Kitchendenies chest pain. has had an annual eye exam. do check the feet. Does check CBGs. Average CBG:110-125   One episodes of hypoglycemia.went down to 40. Doesn't know what happned except missed meals. Does have an emergency hypoglycemic plan. admits toCompliance with medications. Denies Problems with medications.  Past Medical History  Diagnosis Date  . Bruit   . Hyperlipidemia   . HTN (hypertension)   . CAD   . PVD   . VENOUS INSUFFICIENCY   . DM   . OBESITY   . Edema   . PONV (postoperative nausea and vomiting)   . Angina 1995  . Carotid artery occlusion   . Myocardial infarction 1995   Past Surgical History  Procedure Laterality Date  . Coronary angioplasty with stent placement  2006  . Cataract extraction w/phaco  11/01/2011    Procedure: CATARACT EXTRACTION PHACO AND INTRAOCULAR LENS PLACEMENT (IOC);  Surgeon: Gemma Payor, MD;  Location: [facility];  Service: Ophthalmology;  Laterality: Right;  CDE:12.21  . Cataract extraction w/phaco  01/31/2012    Procedure: CATARACT EXTRACTION PHACO AND INTRAOCULAR LENS PLACEMENT (IOC);  Surgeon: Gemma Payor, MD;  Location: [facility];  Service: Ophthalmology;  Laterality: Left;  CDE:11.77   History   Social History  . Marital Status: Widowed    Spouse Name: N/A    Number of Children: N/A  . Years of Education: N/A   Occupational History  . Not on file.   Social History Main Topics  . Smoking status: Current Every Day Smoker -- 0.50 packs/day for 53 years    Types: Cigarettes  . Smokeless tobacco: Never  Used     Comment: smoker since age 71  . Alcohol Use: No  . Drug Use: No  . Sexually Active: Yes    Birth Control/ Protection: Post-menopausal   Other Topics Concern  . Not on file   Social History Narrative  . No narrative on file   Family History  Problem Relation Age of Onset  . Anesthesia problems Neg Hx   . Hypotension Neg Hx   . Malignant hyperthermia Neg Hx   . Pseudochol deficiency Neg Hx    Current Outpatient Prescriptions on File Prior to Visit  Medication Sig Dispense Refill  . ALPRAZolam (XANAX) 0.25 MG tablet prn      . atorvastatin (LIPITOR) 80 MG tablet Take 1 tablet (80 mg total) by mouth daily.  90 tablet  3  . cilostazol (PLETAL) 100 MG tablet Take 1 tablet (100 mg total) by mouth 2 (two) times daily.  180 tablet  3  . clopidogrel (PLAVIX) 75 MG tablet TAKE 1 TABLET BY MOUTH DAILY  90 tablet  3  . glimepiride (AMARYL) 2 MG tablet TAKE 1 TABLET (2 MG TOTAL) BY MOUTH DAILY BEFORE BREAKFAST.  30 tablet  2  . glucose blood (ACCU-CHEK AVIVA) test strip Use as instructed  100 each  0  . linagliptin (TRADJENTA) 5 MG TABS tablet Take 1 tablet (5 mg total) by mouth daily.  90 tablet  3  . lisinopril-hydrochlorothiazide (PRINZIDE,ZESTORETIC) 20-12.5 MG per tablet Take 1 tablet by mouth  daily.  90 tablet  3  . metoprolol succinate (TOPROL-XL) 50 MG 24 hr tablet Take 75 mg by mouth 2 (two) times daily.  180 tablet  3  . nitroGLYCERIN (NITROSTAT) 0.4 MG SL tablet Place 1 tablet (0.4 mg total) under the tongue every 5 (five) minutes as needed.  25 tablet  11  . PATADAY 0.2 % SOLN        No current facility-administered medications on file prior to visit.   No Known Allergies  There is no immunization history on file for this patient. Prior to Admission medications   Medication Sig Start Date End Date Taking? Authorizing Provider  ALPRAZolam Prudy Feeler) 0.25 MG tablet prn 04/28/12   Historical Provider, MD  atorvastatin (LIPITOR) 80 MG tablet Take 1 tablet (80 mg total) by  mouth daily. 12/12/12   Wendall Stade, MD  cilostazol (PLETAL) 100 MG tablet Take 1 tablet (100 mg total) by mouth 2 (two) times daily. 12/12/12   Wendall Stade, MD  clopidogrel (PLAVIX) 75 MG tablet TAKE 1 TABLET BY MOUTH DAILY 12/12/12   Wendall Stade, MD  glimepiride (AMARYL) 2 MG tablet TAKE 1 TABLET (2 MG TOTAL) BY MOUTH DAILY BEFORE BREAKFAST. 02/19/13   Ileana Ladd, MD  glucose blood (ACCU-CHEK AVIVA) test strip Use as instructed 02/05/13   Ernestina Penna, MD  linagliptin (TRADJENTA) 5 MG TABS tablet Take 1 tablet (5 mg total) by mouth daily. 12/12/12   Wendall Stade, MD  lisinopril-hydrochlorothiazide (PRINZIDE,ZESTORETIC) 20-12.5 MG per tablet Take 1 tablet by mouth daily. 12/12/12   Wendall Stade, MD  metoprolol succinate (TOPROL-XL) 50 MG 24 hr tablet Take 75 mg by mouth 2 (two) times daily. 12/12/12   Wendall Stade, MD  nitroGLYCERIN (NITROSTAT) 0.4 MG SL tablet Place 1 tablet (0.4 mg total) under the tongue every 5 (five) minutes as needed. 12/12/12   Wendall Stade, MD  PATADAY 0.2 % SOLN  10/21/12   Historical Provider, MD    ROS: As above in the HPI. All other systems are stable or negative.  OBJECTIVE: APPEARANCE:  Patient in no acute distress.The patient appeared well nourished and normally developed. Acyanotic. Waist: VITAL SIGNS:BP 131/70  Pulse 83  Temp(Src) 97.3 F (36.3 C) (Oral)  Wt 244 lb 3.2 oz (110.768 kg)  BMI 44.65 kg/m2  Obese WF SKIN: warm and  Dry without overt rashes, tattoos and scars  HEAD and Neck: without JVD, Head and scalp: normal Eyes:No scleral icterus. Fundi normal, eye movements normal. Ears: Auricle normal, canal normal, Tympanic membranes normal, insufflation normal. Nose: normal Throat: normal Neck & thyroid: normal  CHEST & LUNGS: Chest wall: normal Lungs: Clear  CVS: Reveals the PMI to be normally located. Regular rhythm, First and Second Heart sounds are normal,  absence of murmurs, rubs or gallops. Peripheral  vasculature: Radial pulses: normal Dorsal pedis pulses: normal Posterior pulses: normal  ABDOMEN:  Appearance: morbidly Obese Benign, no organomegaly, no masses, no Abdominal Aortic enlargement. No Guarding , no rebound. No Bruits. Bowel sounds: normal  RECTAL: N/A GU: N/A  EXTREMETIES: 2+edematous. Both Femoral and Pedal pulses are normal.  MUSCULOSKELETAL:  Spine: normal Joints: intact  NEUROLOGIC: oriented to time,place and person; nonfocal. Strength is normal Sensory is normal Reflexes are normal Cranial Nerves are normal.  ASSESSMENT: CAD  Occlusion and stenosis of carotid artery without mention of cerebral infarction, unspecified laterality  DM - Plan: POCT glycosylated hemoglobin (Hb A1C), POCT UA - Microalbumin, CMP14+EGFR  HYPERLIPIDEMIA - Plan:  CMP14+EGFR, NMR, lipoprofile  HYPERTENSION - Plan: CMP14+EGFR  OBESITY  SMOKER  VENOUS INSUFFICIENCY  PLAN: Orders Placed This Encounter  Procedures  . CMP14+EGFR  . NMR, lipoprofile  . POCT glycosylated hemoglobin (Hb A1C)  . POCT UA - Microalbumin        Dr Woodroe Mode Recommendations  Diet and Exercise discussed with patient.  For nutrition information, I recommend books:  1).Eat to Live by Dr Monico Hoar. 2).Prevent and Reverse Heart Disease by Dr Suzzette Righter. 3) Dr Katherina Right Book:  Program to Reverse Diabetes  Exercise recommendations are:  If unable to walk, then the patient can exercise in a chair 3 times a day. By flapping arms like a bird gently and raising legs outwards to the front.  If ambulatory, the patient can go for walks for 30 minutes 3 times a week. Then increase the intensity and duration as tolerated.  Goal is to try to attain exercise frequency to 5 times a week.  If applicable: Best to perform resistance exercises (machines or weights) 2 days a week and cardio type exercises 3 days per week.  Return in about 3 months (around 06/01/2013) for Recheck medical  problems.  Dora Clauss P. Modesto Charon, M.D.

## 2013-03-01 NOTE — Patient Instructions (Addendum)
Smoking Cessation Quitting smoking is important to your health and has many advantages. However, it is not always easy to quit since nicotine is a very addictive drug. Often times, people try 3 times or more before being able to quit. This document explains the best ways for you to prepare to quit smoking. Quitting takes hard work and a lot of effort, but you can do it. ADVANTAGES OF QUITTING SMOKING  You will live longer, feel better, and live better.  Your body will feel the impact of quitting smoking almost immediately.  Within 20 minutes, blood pressure decreases. Your pulse returns to its normal level.  After 8 hours, carbon monoxide levels in the blood return to normal. Your oxygen level increases.  After 24 hours, the chance of having a heart attack starts to decrease. Your breath, hair, and body stop smelling like smoke.  After 48 hours, damaged nerve endings begin to recover. Your sense of taste and smell improve.  After 72 hours, the body is virtually free of nicotine. Your bronchial tubes relax and breathing becomes easier.  After 2 to 12 weeks, lungs can hold more air. Exercise becomes easier and circulation improves.  The risk of having a heart attack, stroke, cancer, or lung disease is greatly reduced.  After 1 year, the risk of coronary heart disease is cut in half.  After 5 years, the risk of stroke falls to the same as a nonsmoker.  After 10 years, the risk of lung cancer is cut in half and the risk of other cancers decreases significantly.  After 15 years, the risk of coronary heart disease drops, usually to the level of a nonsmoker.  If you are pregnant, quitting smoking will improve your chances of having a healthy baby.  The people you live with, especially any children, will be healthier.  You will have extra money to spend on things other than cigarettes. QUESTIONS TO THINK ABOUT BEFORE ATTEMPTING TO QUIT You may want to talk about your answers with your  caregiver.  Why do you want to quit?  If you tried to quit in the past, what helped and what did not?  What will be the most difficult situations for you after you quit? How will you plan to handle them?  Who can help you through the tough times? Your family? Friends? A caregiver?  What pleasures do you get from smoking? What ways can you still get pleasure if you quit? Here are some questions to ask your caregiver:  How can you help me to be successful at quitting?  What medicine do you think would be best for me and how should I take it?  What should I do if I need more help?  What is smoking withdrawal like? How can I get information on withdrawal? GET READY  Set a quit date.  Change your environment by getting rid of all cigarettes, ashtrays, matches, and lighters in your home, car, or work. Do not let people smoke in your home.  Review your past attempts to quit. Think about what worked and what did not. GET SUPPORT AND ENCOURAGEMENT You have a better chance of being successful if you have help. You can get support in many ways.  Tell your family, friends, and co-workers that you are going to quit and need their support. Ask them not to smoke around you.  Get individual, group, or telephone counseling and support. Programs are available at local hospitals and health centers. Call your local health department for   information about programs in your area.  Spiritual beliefs and practices may help some smokers quit.  Download a "quit meter" on your computer to keep track of quit statistics, such as how long you have gone without smoking, cigarettes not smoked, and money saved.  Get a self-help book about quitting smoking and staying off of tobacco. LEARN NEW SKILLS AND BEHAVIORS  Distract yourself from urges to smoke. Talk to someone, go for a walk, or occupy your time with a task.  Change your normal routine. Take a different route to work. Drink tea instead of coffee.  Eat breakfast in a different place.  Reduce your stress. Take a hot bath, exercise, or read a book.  Plan something enjoyable to do every day. Reward yourself for not smoking.  Explore interactive web-based programs that specialize in helping you quit. GET MEDICINE AND USE IT CORRECTLY Medicines can help you stop smoking and decrease the urge to smoke. Combining medicine with the above behavioral methods and support can greatly increase your chances of successfully quitting smoking.  Nicotine replacement therapy helps deliver nicotine to your body without the negative effects and risks of smoking. Nicotine replacement therapy includes nicotine gum, lozenges, inhalers, nasal sprays, and skin patches. Some may be available over-the-counter and others require a prescription.  Antidepressant medicine helps people abstain from smoking, but how this works is unknown. This medicine is available by prescription.  Nicotinic receptor partial agonist medicine simulates the effect of nicotine in your brain. This medicine is available by prescription. Ask your caregiver for advice about which medicines to use and how to use them based on your health history. Your caregiver will tell you what side effects to look out for if you choose to be on a medicine or therapy. Carefully read the information on the package. Do not use any other product containing nicotine while using a nicotine replacement product.  RELAPSE OR DIFFICULT SITUATIONS Most relapses occur within the first 3 months after quitting. Do not be discouraged if you start smoking again. Remember, most people try several times before finally quitting. You may have symptoms of withdrawal because your body is used to nicotine. You may crave cigarettes, be irritable, feel very hungry, cough often, get headaches, or have difficulty concentrating. The withdrawal symptoms are only temporary. They are strongest when you first quit, but they will go away within  10 14 days. To reduce the chances of relapse, try to:  Avoid drinking alcohol. Drinking lowers your chances of successfully quitting.  Reduce the amount of caffeine you consume. Once you quit smoking, the amount of caffeine in your body increases and can give you symptoms, such as a rapid heartbeat, sweating, and anxiety.  Avoid smokers because they can make you want to smoke.  Do not let weight gain distract you. Many smokers will gain weight when they quit, usually less than 10 pounds. Eat a healthy diet and stay active. You can always lose the weight gained after you quit.  Find ways to improve your mood other than smoking. FOR MORE INFORMATION  www.smokefree.gov  Document Released: 07/06/2001 Document Revised: 01/11/2012 Document Reviewed: 10/21/2011 [facility] Patient Information 2014 [place], [place].        Dr Woodroe Mode Recommendations  Diet and Exercise discussed with patient.  For nutrition information, I recommend books:  1).Eat to Live by Dr Monico Hoar. 2).Prevent and Reverse Heart Disease by Dr Suzzette Righter. 3) Dr Katherina Right Book:  Program to Reverse Diabetes  Exercise recommendations are:  If  unable to walk, then the patient can exercise in a chair 3 times a day. By flapping arms like a bird gently and raising legs outwards to the front.  If ambulatory, the patient can go for walks for 30 minutes 3 times a week. Then increase the intensity and duration as tolerated.  Goal is to try to attain exercise frequency to 5 times a week.  If applicable: Best to perform resistance exercises (machines or weights) 2 days a week and cardio type exercises 3 days per week.

## 2013-03-02 ENCOUNTER — Other Ambulatory Visit: Payer: Medicare Other

## 2013-03-02 DIAGNOSIS — E119 Type 2 diabetes mellitus without complications: Secondary | ICD-10-CM | POA: Diagnosis not present

## 2013-03-02 LAB — POCT UA - MICROALBUMIN: Microalbumin Ur, POC: POSITIVE mg/L

## 2013-03-02 NOTE — Addendum Note (Signed)
Addended by: Lisbeth Ply C on: 03/02/2013 10:17 AM   Modules accepted: Orders

## 2013-03-04 LAB — NMR, LIPOPROFILE
Cholesterol: 130 mg/dL (ref <200)
HDL Cholesterol by NMR: 47 mg/dL (ref >=40)
HDL Particle Number: 31.4 umol/L (ref >=30.5)
LDL Particle Number: 858 nmol/L (ref <1000)
LDL Size: 21 nm (ref >20.5)
LDLC SERPL CALC-MCNC: 49 mg/dL (ref <100)
LP-IR Score: 67 — ABNORMAL HIGH (ref <=45)
Small LDL Particle Number: 408 nmol/L (ref <=527)
Triglycerides by NMR: 170 mg/dL — ABNORMAL HIGH (ref <150)

## 2013-03-04 LAB — CMP14+EGFR
ALT: 9 IU/L (ref 0–32)
AST: 13 IU/L (ref 0–40)
Albumin/Globulin Ratio: 1.6 (ref 1.1–2.5)
Albumin: 4 g/dL (ref 3.5–4.8)
Alkaline Phosphatase: 132 IU/L — ABNORMAL HIGH (ref 39–117)
BUN/Creatinine Ratio: 15 (ref 11–26)
BUN: 23 mg/dL (ref 8–27)
CO2: 25 mmol/L (ref 18–29)
Calcium: 8.9 mg/dL (ref 8.6–10.2)
Chloride: 105 mmol/L (ref 97–108)
Creatinine, Ser: 1.5 mg/dL — ABNORMAL HIGH (ref 0.57–1.00)
GFR calc Af Amer: 39 mL/min/{1.73_m2} — ABNORMAL LOW (ref >59)
GFR calc non Af Amer: 34 mL/min/{1.73_m2} — ABNORMAL LOW (ref >59)
Globulin, Total: 2.5 g/dL (ref 1.5–4.5)
Glucose: 113 mg/dL — ABNORMAL HIGH (ref 65–99)
Potassium: 4.9 mmol/L (ref 3.5–5.2)
Sodium: 143 mmol/L (ref 134–144)
Total Bilirubin: 0.6 mg/dL (ref 0.0–1.2)
Total Protein: 6.5 g/dL (ref 6.0–8.5)

## 2013-03-07 NOTE — Progress Notes (Signed)
Quick Note:  Lab result at goal or close No change in Medications for now.  But I would like her to take Omega-3 fish oil 1000 mg three times a day to reduce the Triglycerides. Also needs to reduce the quickly digested Carbohydrates such as bread , rice , pasta and white potatoes. This will make the TG go up. Avoid cigarette smoke.  No Change in plans for follow up. ______

## 2013-05-11 ENCOUNTER — Other Ambulatory Visit: Payer: Self-pay | Admitting: Family Medicine

## 2013-05-14 NOTE — Telephone Encounter (Signed)
Last seen 03/01/13  FPP  Voltaren not on EPIC list or in hcart

## 2013-06-06 ENCOUNTER — Other Ambulatory Visit: Payer: Self-pay | Admitting: Family Medicine

## 2013-06-19 ENCOUNTER — Ambulatory Visit (HOSPITAL_COMMUNITY): Payer: Medicare Other | Attending: Family Medicine

## 2013-06-19 ENCOUNTER — Encounter: Payer: Self-pay | Admitting: Cardiovascular Disease

## 2013-06-19 ENCOUNTER — Ambulatory Visit (INDEPENDENT_AMBULATORY_CARE_PROVIDER_SITE_OTHER): Payer: Medicare Other | Admitting: Cardiovascular Disease

## 2013-06-19 VITALS — BP 140/68 | HR 70 | Ht 60.0 in | Wt 245.0 lb

## 2013-06-19 DIAGNOSIS — I10 Essential (primary) hypertension: Secondary | ICD-10-CM

## 2013-06-19 DIAGNOSIS — I658 Occlusion and stenosis of other precerebral arteries: Secondary | ICD-10-CM | POA: Diagnosis not present

## 2013-06-19 DIAGNOSIS — G458 Other transient cerebral ischemic attacks and related syndromes: Secondary | ICD-10-CM

## 2013-06-19 DIAGNOSIS — E785 Hyperlipidemia, unspecified: Secondary | ICD-10-CM

## 2013-06-19 DIAGNOSIS — I2510 Atherosclerotic heart disease of native coronary artery without angina pectoris: Secondary | ICD-10-CM | POA: Diagnosis not present

## 2013-06-19 DIAGNOSIS — I6529 Occlusion and stenosis of unspecified carotid artery: Secondary | ICD-10-CM

## 2013-06-19 DIAGNOSIS — E119 Type 2 diabetes mellitus without complications: Secondary | ICD-10-CM

## 2013-06-19 DIAGNOSIS — F17200 Nicotine dependence, unspecified, uncomplicated: Secondary | ICD-10-CM

## 2013-06-19 NOTE — Assessment & Plan Note (Signed)
Cholesterol is at goal.  Continue current dose of statin and diet Rx.  No myalgias or side effects.  F/U  LFT's in 6 months. Lab Results  Component Value Date   LDLCALC 52 10/30/2012

## 2013-06-19 NOTE — Assessment & Plan Note (Signed)
Counseled for less than 10 minutes no motivation to quit Relationship to vascular disease also discussed

## 2013-06-19 NOTE — Assessment & Plan Note (Signed)
Well controlled.  Continue current medications and low sodium Dash type diet.    

## 2013-06-19 NOTE — Assessment & Plan Note (Signed)
Discussed low carb diet.  Target hemoglobin A1c is 6.5 or less.  Continue current medications.  

## 2013-06-19 NOTE — Patient Instructions (Signed)
Your physician wants you to follow-up in:   6 MONTHS WITH  DR   NISHAN  AND  CAROTID  SAME  DAY  You will receive a reminder letter in the mail two months in advance. If you don't receive a letter, please call our office to schedule the follow-up appointment. Your physician recommends that you continue on your current medications as directed. Please refer to the Current Medication list given to you today. 

## 2013-06-19 NOTE — Assessment & Plan Note (Signed)
F/U duplex in 6 months suspect she will need CEA within 2 years

## 2013-06-19 NOTE — Assessment & Plan Note (Signed)
Stable with no angina and good activity level.  Continue medical Rx  

## 2013-06-19 NOTE — Progress Notes (Signed)
Patient ID: [name] [name], female   DOB: 1936/03/09, 77 y.o.   MRN: [id number] [name] [name] returns today for followup. She has had previous stenting of the right coronary artery. She has had resting tachycardia. We started her on beta-blockers and she improved. We did not find a good reason forher tachycardia, although she is diabetic and may have some autonomic dysfunction. She has good LV function. She has bilateral carotid disease. I reviewed duplex from today Previously was concerned since her systolic velocities were greater than 65m/sec and there was lots of calcium These were at the high end. F/U CTA also reviewed and indicated less than 80% disease and Dr Edilia Bo . I spoke with him personally about the decidsion making process in this case There have been no TIA like symptoms. Duplex today was stable with bilateral 60-79% stenosis. F/U duplex then November 2014  She has smoked since age 32. She is not motivated to quit. I counseled her for less than 10 minutes She had previously tried patches 2 years agao with limited success and they were too expensive. Willing to try Welbutrin but not Chantix . Her BS has been reasonably controlled despite a poor diet.  Echo 5/12 normal EF 55% mild LVH  Duplex today with left sided velocity 361/62    ROS: Denies fever, malais, weight loss, blurry vision, decreased visual acuity, cough, sputum, SOB, hemoptysis, pleuritic pain, palpitaitons, heartburn, abdominal pain, melena, lower extremity edema, claudication, or rash.  All other systems reviewed and negative  General: Affect appropriate Healthy:  appears stated age HEENT: normal Neck supple with no adenopathy JVP normal no bruits no thyromegaly Lungs clear with no wheezing and good diaphragmatic motion Heart:  S1/S2 no murmur, no rub, gallop or click PMI normal Abdomen: benighn, BS positve, no tenderness, no AAA no bruit.  No HSM or HJR Distal pulses intact with no bruits No edema Neuro non-focal Skin warm  and dry No muscular weakness   Current Outpatient Prescriptions  Medication Sig Dispense Refill  . ALPRAZolam (XANAX) 0.25 MG tablet prn      . atorvastatin (LIPITOR) 80 MG tablet Take 1 tablet (80 mg total) by mouth daily.  90 tablet  3  . cilostazol (PLETAL) 100 MG tablet Take 1 tablet (100 mg total) by mouth 2 (two) times daily.  180 tablet  3  . clopidogrel (PLAVIX) 75 MG tablet TAKE 1 TABLET BY MOUTH DAILY  90 tablet  3  . glimepiride (AMARYL) 2 MG tablet TAKE 1 TABLET (2 MG TOTAL) BY MOUTH DAILY BEFORE BREAKFAST.  30 tablet  0  . glucose blood (ACCU-CHEK AVIVA) test strip Use as instructed  100 each  0  . linagliptin (TRADJENTA) 5 MG TABS tablet Take 1 tablet (5 mg total) by mouth daily.  90 tablet  3  . lisinopril-hydrochlorothiazide (PRINZIDE,ZESTORETIC) 20-12.5 MG per tablet Take 1 tablet by mouth daily.  90 tablet  3  . metoprolol succinate (TOPROL-XL) 50 MG 24 hr tablet Take 75 mg by mouth 2 (two) times daily.  180 tablet  3  . nitroGLYCERIN (NITROSTAT) 0.4 MG SL tablet Place 1 tablet (0.4 mg total) under the tongue every 5 (five) minutes as needed.  25 tablet  11  . VOLTAREN 1 % GEL USE 4 GRAMS TO BOTH HANDS 4 TIMES A DAY  300 g  0   No current facility-administered medications for this visit.    Allergies  Review of patient's allergies indicates no known allergies.  Electrocardiogram:  SR rate 70  Normal   Assessment and Plan

## 2013-07-03 ENCOUNTER — Ambulatory Visit (INDEPENDENT_AMBULATORY_CARE_PROVIDER_SITE_OTHER): Payer: Medicare Other

## 2013-07-03 ENCOUNTER — Ambulatory Visit (INDEPENDENT_AMBULATORY_CARE_PROVIDER_SITE_OTHER): Payer: Medicare Other | Admitting: Family Medicine

## 2013-07-03 ENCOUNTER — Encounter: Payer: Self-pay | Admitting: Family Medicine

## 2013-07-03 VITALS — BP 151/84 | HR 87 | Temp 98.9°F | Ht 62.0 in | Wt 247.2 lb

## 2013-07-03 DIAGNOSIS — R059 Cough, unspecified: Secondary | ICD-10-CM | POA: Diagnosis not present

## 2013-07-03 DIAGNOSIS — E785 Hyperlipidemia, unspecified: Secondary | ICD-10-CM | POA: Diagnosis not present

## 2013-07-03 DIAGNOSIS — E119 Type 2 diabetes mellitus without complications: Secondary | ICD-10-CM | POA: Diagnosis not present

## 2013-07-03 DIAGNOSIS — I872 Venous insufficiency (chronic) (peripheral): Secondary | ICD-10-CM | POA: Diagnosis not present

## 2013-07-03 DIAGNOSIS — F17200 Nicotine dependence, unspecified, uncomplicated: Secondary | ICD-10-CM

## 2013-07-03 DIAGNOSIS — I6529 Occlusion and stenosis of unspecified carotid artery: Secondary | ICD-10-CM | POA: Diagnosis not present

## 2013-07-03 DIAGNOSIS — I10 Essential (primary) hypertension: Secondary | ICD-10-CM

## 2013-07-03 DIAGNOSIS — J209 Acute bronchitis, unspecified: Secondary | ICD-10-CM

## 2013-07-03 DIAGNOSIS — R05 Cough: Secondary | ICD-10-CM

## 2013-07-03 DIAGNOSIS — E669 Obesity, unspecified: Secondary | ICD-10-CM

## 2013-07-03 DIAGNOSIS — I739 Peripheral vascular disease, unspecified: Secondary | ICD-10-CM

## 2013-07-03 LAB — POCT GLYCOSYLATED HEMOGLOBIN (HGB A1C): Hemoglobin A1C: 6.1

## 2013-07-03 MED ORDER — AMOXICILLIN 875 MG PO TABS: 875.0000 mg | ORAL_TABLET | Freq: Two times a day (BID) | ORAL | Status: DC

## 2013-07-03 NOTE — Progress Notes (Signed)
Patient ID: [name] [name], female   DOB: 11-Feb-1936, 77 y.o.   MRN: [id number] SUBJECTIVE: CC: Chief Complaint  Patient presents with  . Follow-up    4 month follow up c/o cold and cough since Thanksgiving    HPI:  Patient is here for follow up of Diabetes Mellitus/HTN/HLD/Obesity/CAD/: Symptoms evaluated: Denies Nocturia ,Denies Urinary Frequency , denies Blurred vision ,deniesDizziness,denies.Dysuria,denies paresthesias, denies extremity pain or ulcers.Marland Kitchendenies chest pain. has had an annual eye exam. do check the feet. Does check CBGs. Average WGN:FAOZH'Y check regularly Denies episodes of hypoglycemia. Does have an emergency hypoglycemic plan. admits toCompliance with medications. Denies Problems with medications.   Past Medical History  Diagnosis Date  . Bruit   . Hyperlipidemia   . HTN (hypertension)   . CAD   . PVD   . VENOUS INSUFFICIENCY   . DM   . OBESITY   . Edema   . PONV (postoperative nausea and vomiting)   . Angina 1995  . Carotid artery occlusion   . Myocardial infarction 1995   Past Surgical History  Procedure Laterality Date  . Coronary angioplasty with stent placement  2006  . Cataract extraction w/phaco  11/01/2011    Procedure: CATARACT EXTRACTION PHACO AND INTRAOCULAR LENS PLACEMENT (IOC);  Surgeon: Gemma Payor, MD;  Location: [facility];  Service: Ophthalmology;  Laterality: Right;  CDE:12.21  . Cataract extraction w/phaco  01/31/2012    Procedure: CATARACT EXTRACTION PHACO AND INTRAOCULAR LENS PLACEMENT (IOC);  Surgeon: Gemma Payor, MD;  Location: [facility];  Service: Ophthalmology;  Laterality: Left;  CDE:11.77   History   Social History  . Marital Status: Widowed    Spouse Name: N/A    Number of Children: N/A  . Years of Education: N/A   Occupational History  . Not on file.   Social History Main Topics  . Smoking status: Current Every Day Smoker -- 0.50 packs/day for 53 years    Types: Cigarettes  . Smokeless tobacco: Never Used     Comment:  smoker since age 77  . Alcohol Use: No  . Drug Use: No  . Sexual Activity: Yes    Birth Control/ Protection: Post-menopausal   Other Topics Concern  . Not on file   Social History Narrative  . No narrative on file   Family History  Problem Relation Age of Onset  . Anesthesia problems Neg Hx   . Hypotension Neg Hx   . Malignant hyperthermia Neg Hx   . Pseudochol deficiency Neg Hx    Current Outpatient Prescriptions on File Prior to Visit  Medication Sig Dispense Refill  . ALPRAZolam (XANAX) 0.25 MG tablet prn      . atorvastatin (LIPITOR) 80 MG tablet Take 1 tablet (80 mg total) by mouth daily.  90 tablet  3  . cilostazol (PLETAL) 100 MG tablet Take 1 tablet (100 mg total) by mouth 2 (two) times daily.  180 tablet  3  . clopidogrel (PLAVIX) 75 MG tablet TAKE 1 TABLET BY MOUTH DAILY  90 tablet  3  . glimepiride (AMARYL) 2 MG tablet TAKE 1 TABLET (2 MG TOTAL) BY MOUTH DAILY BEFORE BREAKFAST.  30 tablet  0  . glucose blood (ACCU-CHEK AVIVA) test strip Use as instructed  100 each  0  . linagliptin (TRADJENTA) 5 MG TABS tablet Take 1 tablet (5 mg total) by mouth daily.  90 tablet  3  . lisinopril-hydrochlorothiazide (PRINZIDE,ZESTORETIC) 20-12.5 MG per tablet Take 1 tablet by mouth daily.  90 tablet  3  .  metoprolol succinate (TOPROL-XL) 50 MG 24 hr tablet Take 75 mg by mouth 2 (two) times daily.  180 tablet  3  . nitroGLYCERIN (NITROSTAT) 0.4 MG SL tablet Place 1 tablet (0.4 mg total) under the tongue every 5 (five) minutes as needed.  25 tablet  11  . VOLTAREN 1 % GEL USE 4 GRAMS TO BOTH HANDS 4 TIMES A DAY  300 g  0   No current facility-administered medications on file prior to visit.   No Known Allergies Immunization History  Administered Date(s) Administered  . Pneumococcal Polysaccharide-23 04/17/2012  . Td 08/13/2009   Prior to Admission medications   Medication Sig Start Date End Date Taking? Authorizing Provider  ALPRAZolam Prudy Feeler) 0.25 MG tablet prn 04/28/12  Yes  Historical Provider, MD  atorvastatin (LIPITOR) 80 MG tablet Take 1 tablet (80 mg total) by mouth daily. 12/12/12  Yes Wendall Stade, MD  cilostazol (PLETAL) 100 MG tablet Take 1 tablet (100 mg total) by mouth 2 (two) times daily. 12/12/12  Yes Wendall Stade, MD  clopidogrel (PLAVIX) 75 MG tablet TAKE 1 TABLET BY MOUTH DAILY 12/12/12  Yes Wendall Stade, MD  glimepiride (AMARYL) 2 MG tablet TAKE 1 TABLET (2 MG TOTAL) BY MOUTH DAILY BEFORE BREAKFAST. 06/06/13  Yes Ileana Ladd, MD  glucose blood (ACCU-CHEK AVIVA) test strip Use as instructed 02/05/13  Yes Ernestina Penna, MD  linagliptin (TRADJENTA) 5 MG TABS tablet Take 1 tablet (5 mg total) by mouth daily. 12/12/12  Yes Wendall Stade, MD  lisinopril-hydrochlorothiazide (PRINZIDE,ZESTORETIC) 20-12.5 MG per tablet Take 1 tablet by mouth daily. 12/12/12  Yes Wendall Stade, MD  metoprolol succinate (TOPROL-XL) 50 MG 24 hr tablet Take 75 mg by mouth 2 (two) times daily. 12/12/12  Yes Wendall Stade, MD  nitroGLYCERIN (NITROSTAT) 0.4 MG SL tablet Place 1 tablet (0.4 mg total) under the tongue every 5 (five) minutes as needed. 12/12/12  Yes Wendall Stade, MD  VOLTAREN 1 % GEL USE 4 GRAMS TO BOTH HANDS 4 TIMES A DAY 05/11/13  Yes Leronda-Margaret Daphine Deutscher, FNP     ROS: As above in the HPI. All other systems are stable or negative.  OBJECTIVE: APPEARANCE:  Patient in no acute distress.The patient appeared well nourished and normally developed. Acyanotic. Waist: VITAL SIGNS:BP 151/84  Pulse 87  Temp(Src) 98.9 F (37.2 C) (Oral)  Ht 5\' 2"  (1.575 m)  Wt 247 lb 3.2 oz (112.129 kg)  BMI 45.20 kg/m2  SpO2 95%  WF Morbidly obese  SKIN: warm and  Dry without overt rashes, tattoos and scars  HEAD and Neck: without JVD, Head and scalp: normal Eyes:No scleral icterus. Fundi normal, eye movements normal. Ears: Auricle normal, canal normal, Tympanic membranes normal, insufflation normal. Nose: normal Throat: normal Neck & thyroid: normal  CHEST &  LUNGS: Chest wall: normal Lungs: Coarse bs, no Rales, no wheezes  CVS: Reveals the PMI to be normally located. Regular rhythm, First and Second Heart sounds are normal,  absence of murmurs, rubs or gallops. Peripheral vasculature: Radial pulses: normal  ABDOMEN:  Appearance: obese Benign, no organomegaly, no masses, no Abdominal Aortic enlargement. No Guarding , no rebound. No Bruits. Bowel sounds: normal  RECTAL: N/A GU: N/A  EXTREMETIES: trace edematous.   NEUROLOGIC: oriented to time,place and person; nonfocal. Strength is normal Sensory is normal Reflexes are normal Cranial Nerves are normal.  DM foot exam see Module  ASSESSMENT:  DM - Plan: POCT glycosylated hemoglobin (Hb A1C), CMP14+EGFR  Cough -  Plan: DG Chest 2 View  HYPERLIPIDEMIA - Plan: CMP14+EGFR, NMR, lipoprofile  VENOUS INSUFFICIENCY  SMOKER  Acute bronchitis - Plan: amoxicillin (AMOXIL) 875 MG tablet  PVD  HYPERTENSION  OBESITY  Occlusion and stenosis of carotid artery without mention of cerebral infarction, unspecified laterality  PLAN:      Dr Woodroe Mode Recommendations  For nutrition information, I recommend books:  1).Eat to Live by Dr Monico Hoar. 2).Prevent and Reverse Heart Disease by Dr Suzzette Righter. 3) Dr Katherina Right Book:  Program to Reverse Diabetes  Exercise recommendations are:  If unable to walk, then the patient can exercise in a chair 3 times a day. By flapping arms like a bird gently and raising legs outwards to the front.  If ambulatory, the patient can go for walks for 30 minutes 3 times a week. Then increase the intensity and duration as tolerated.  Goal is to try to attain exercise frequency to 5 times a week.  If applicable: Best to perform resistance exercises (machines or weights) 2 days a week and cardio type exercises 3 days per week.  DASH diet handout in the AVS  Smoking cessation counseling and handout in the AVS.  Orders Placed This  Encounter  Procedures  . DG Chest 2 View    Standing Status: Future     Number of Occurrences: 1     Standing Expiration Date: 09/03/2014    Order Specific Question:  Reason for Exam (SYMPTOM  OR DIAGNOSIS REQUIRED)    Answer:  cough    Order Specific Question:  Preferred imaging location?    Answer:  Internal  . CMP14+EGFR  . NMR, lipoprofile  . POCT glycosylated hemoglobin (Hb A1C)  [facility] reading (PRIMARY) by  Dr. Modesto Charon: poor inspiratory effort. Atelectasis. Borderline cardiomegaly. Chronic changes.                                   Meds ordered this encounter  Medications  . amoxicillin (AMOXIL) 875 MG tablet    Sig: Take 1 tablet (875 mg total) by mouth 2 (two) times daily.    Dispense:  20 tablet    Refill:  0   There are no discontinued medications. Return in about 4 weeks (around 07/31/2013), or 2 weeks for flu shot, for recheck BP.  Octivia Canion P. Modesto Charon, M.D.

## 2013-07-03 NOTE — Patient Instructions (Addendum)
Smoking Cessation Quitting smoking is important to your health and has many advantages. However, it is not always easy to quit since nicotine is a very addictive drug. Often times, people try 3 times or more before being able to quit. This document explains the best ways for you to prepare to quit smoking. Quitting takes hard work and a lot of effort, but you can do it. ADVANTAGES OF QUITTING SMOKING  You will live longer, feel better, and live better.  Your body will feel the impact of quitting smoking almost immediately.  Within 20 minutes, blood pressure decreases. Your pulse returns to its normal level.  After 8 hours, carbon monoxide levels in the blood return to normal. Your oxygen level increases.  After 24 hours, the chance of having a heart attack starts to decrease. Your breath, hair, and body stop smelling like smoke.  After 48 hours, damaged nerve endings begin to recover. Your sense of taste and smell improve.  After 72 hours, the body is virtually free of nicotine. Your bronchial tubes relax and breathing becomes easier.  After 2 to 12 weeks, lungs can hold more air. Exercise becomes easier and circulation improves.  The risk of having a heart attack, stroke, cancer, or lung disease is greatly reduced.  After 1 year, the risk of coronary heart disease is cut in half.  After 5 years, the risk of stroke falls to the same as a nonsmoker.  After 10 years, the risk of lung cancer is cut in half and the risk of other cancers decreases significantly.  After 15 years, the risk of coronary heart disease drops, usually to the level of a nonsmoker.  If you are pregnant, quitting smoking will improve your chances of having a healthy baby.  The people you live with, especially any children, will be healthier.  You will have extra money to spend on things other than cigarettes. QUESTIONS TO THINK ABOUT BEFORE ATTEMPTING TO QUIT You may want to talk about your answers with your  caregiver.  Why do you want to quit?  If you tried to quit in the past, what helped and what did not?  What will be the most difficult situations for you after you quit? How will you plan to handle them?  Who can help you through the tough times? Your family? Friends? A caregiver?  What pleasures do you get from smoking? What ways can you still get pleasure if you quit? Here are some questions to ask your caregiver:  How can you help me to be successful at quitting?  What medicine do you think would be best for me and how should I take it?  What should I do if I need more help?  What is smoking withdrawal like? How can I get information on withdrawal? GET READY  Set a quit date.  Change your environment by getting rid of all cigarettes, ashtrays, matches, and lighters in your home, car, or work. Do not let people smoke in your home.  Review your past attempts to quit. Think about what worked and what did not. GET SUPPORT AND ENCOURAGEMENT You have a better chance of being successful if you have help. You can get support in many ways.  Tell your family, friends, and co-workers that you are going to quit and need their support. Ask them not to smoke around you.  Get individual, group, or telephone counseling and support. Programs are available at local hospitals and health centers. Call your local health department for   information about programs in your area.  Spiritual beliefs and practices may help some smokers quit.  Download a "quit meter" on your computer to keep track of quit statistics, such as how long you have gone without smoking, cigarettes not smoked, and money saved.  Get a self-help book about quitting smoking and staying off of tobacco. LEARN NEW SKILLS AND BEHAVIORS  Distract yourself from urges to smoke. Talk to someone, go for a walk, or occupy your time with a task.  Change your normal routine. Take a different route to work. Drink tea instead of coffee.  Eat breakfast in a different place.  Reduce your stress. Take a hot bath, exercise, or read a book.  Plan something enjoyable to do every day. Reward yourself for not smoking.  Explore interactive web-based programs that specialize in helping you quit. GET MEDICINE AND USE IT CORRECTLY Medicines can help you stop smoking and decrease the urge to smoke. Combining medicine with the above behavioral methods and support can greatly increase your chances of successfully quitting smoking.  Nicotine replacement therapy helps deliver nicotine to your body without the negative effects and risks of smoking. Nicotine replacement therapy includes nicotine gum, lozenges, inhalers, nasal sprays, and skin patches. Some may be available over-the-counter and others require a prescription.  Antidepressant medicine helps people abstain from smoking, but how this works is unknown. This medicine is available by prescription.  Nicotinic receptor partial agonist medicine simulates the effect of nicotine in your brain. This medicine is available by prescription. Ask your caregiver for advice about which medicines to use and how to use them based on your health history. Your caregiver will tell you what side effects to look out for if you choose to be on a medicine or therapy. Carefully read the information on the package. Do not use any other product containing nicotine while using a nicotine replacement product.  RELAPSE OR DIFFICULT SITUATIONS Most relapses occur within the first 3 months after quitting. Do not be discouraged if you start smoking again. Remember, most people try several times before finally quitting. You may have symptoms of withdrawal because your body is used to nicotine. You may crave cigarettes, be irritable, feel very hungry, cough often, get headaches, or have difficulty concentrating. The withdrawal symptoms are only temporary. They are strongest when you first quit, but they will go away within  10 14 days. To reduce the chances of relapse, try to:  Avoid drinking alcohol. Drinking lowers your chances of successfully quitting.  Reduce the amount of caffeine you consume. Once you quit smoking, the amount of caffeine in your body increases and can give you symptoms, such as a rapid heartbeat, sweating, and anxiety.  Avoid smokers because they can make you want to smoke.  Do not let weight gain distract you. Many smokers will gain weight when they quit, usually less than 10 pounds. Eat a healthy diet and stay active. You can always lose the weight gained after you quit.  Find ways to improve your mood other than smoking. FOR MORE INFORMATION  www.smokefree.gov  Document Released: 07/06/2001 Document Revised: 01/11/2012 Document Reviewed: 10/21/2011 Exit[employer] Patient Information 2014 [employer].        Dr Michial Disney's Recommendations  For nutrition information, I recommend books:  1).Eat to Live by Dr Joel Fuhrman. 2).Prevent and Reverse Heart Disease by Dr Caldwell Esselstyn. 3) Dr Neal Barnard's Book:  Program to Reverse Diabetes  Exercise recommendations are:  If unable to walk, then the patient can   exercise in a chair 3 times a day. By flapping arms like a bird gently and raising legs outwards to the front.  If ambulatory, the patient can go for walks for 30 minutes 3 times a week. Then increase the intensity and duration as tolerated.  Goal is to try to attain exercise frequency to 5 times a week.  If applicable: Best to perform resistance exercises (machines or weights) 2 days a week and cardio type exercises 3 days per week.   DASH Diet The DASH diet stands for "Dietary Approaches to Stop Hypertension." It is a healthy eating plan that has been shown to reduce high blood pressure (hypertension) in as little as 14 days, while also possibly providing other significant health benefits. These other health benefits include reducing the risk of breast cancer after  menopause and reducing the risk of type 2 diabetes, heart disease, colon cancer, and stroke. Health benefits also include weight loss and slowing kidney failure in patients with chronic kidney disease.  DIET GUIDELINES  Limit salt (sodium). Your diet should contain less than 1500 mg of sodium daily.  Limit refined or processed carbohydrates. Your diet should include mostly whole grains. Desserts and added sugars should be used sparingly.  Include small amounts of heart-healthy fats. These types of fats include nuts, oils, and tub margarine. Limit saturated and trans fats. These fats have been shown to be harmful in the body. CHOOSING FOODS  The following food groups are based on a 2000 calorie diet. See your Registered Dietitian for individual calorie needs. Grains and Grain Products (6 to 8 servings daily)  Eat More Often: Whole-wheat bread, brown rice, whole-grain or wheat pasta, quinoa, popcorn without added fat or salt (air popped).  Eat Less Often: White bread, white pasta, white rice, cornbread. Vegetables (4 to 5 servings daily)  Eat More Often: Fresh, frozen, and canned vegetables. Vegetables may be raw, steamed, roasted, or grilled with a minimal amount of fat.  Eat Less Often/Avoid: Creamed or fried vegetables. Vegetables in a cheese sauce. Fruit (4 to 5 servings daily)  Eat More Often: All fresh, canned (in natural juice), or frozen fruits. Dried fruits without added sugar. One hundred percent fruit juice ( cup [237 mL] daily).  Eat Less Often: Dried fruits with added sugar. Canned fruit in light or heavy syrup. Lean Meats, Fish, and Poultry (2 servings or less daily. One serving is 3 to 4 oz [85-114 g]).  Eat More Often: Ninety percent or leaner ground beef, tenderloin, sirloin. Round cuts of beef, chicken breast, turkey breast. All fish. Grill, bake, or broil your meat. Nothing should be fried.  Eat Less Often/Avoid: Fatty cuts of meat, turkey, or chicken leg, thigh, or  wing. Fried cuts of meat or fish. Dairy (2 to 3 servings)  Eat More Often: Low-fat or fat-free milk, low-fat plain or light yogurt, reduced-fat or part-skim cheese.  Eat Less Often/Avoid: Milk (whole, 2%).Whole milk yogurt. Full-fat cheeses. Nuts, Seeds, and Legumes (4 to 5 servings per week)  Eat More Often: All without added salt.  Eat Less Often/Avoid: Salted nuts and seeds, canned beans with added salt. Fats and Sweets (limited)  Eat More Often: Vegetable oils, tub margarines without trans fats, sugar-free gelatin. Mayonnaise and salad dressings.  Eat Less Often/Avoid: Coconut oils, palm oils, butter, stick margarine, cream, half and half, cookies, candy, pie. FOR MORE INFORMATION The Dash Diet Eating Plan: www.dashdiet.org Document Released: 07/01/2011 Document Revised: 10/04/2011 Document Reviewed: 07/01/2011 Exit[employer] Patient Information 2014 [employer].  

## 2013-07-05 LAB — CMP14+EGFR
ALT: 12 IU/L (ref 0–32)
AST: 12 IU/L (ref 0–40)
Albumin/Globulin Ratio: 1.4 (ref 1.1–2.5)
Albumin: 4 g/dL (ref 3.5–4.8)
Alkaline Phosphatase: 132 IU/L — ABNORMAL HIGH (ref 39–117)
BUN/Creatinine Ratio: 12 (ref 11–26)
BUN: 19 mg/dL (ref 8–27)
CO2: 26 mmol/L (ref 18–29)
Calcium: 9.2 mg/dL (ref 8.6–10.2)
Chloride: 102 mmol/L (ref 97–108)
Creatinine, Ser: 1.56 mg/dL — ABNORMAL HIGH (ref 0.57–1.00)
GFR calc Af Amer: 37 mL/min/{1.73_m2} — ABNORMAL LOW (ref >59)
GFR calc non Af Amer: 32 mL/min/{1.73_m2} — ABNORMAL LOW (ref >59)
Globulin, Total: 2.8 g/dL (ref 1.5–4.5)
Glucose: 138 mg/dL — ABNORMAL HIGH (ref 65–99)
Potassium: 5 mmol/L (ref 3.5–5.2)
Sodium: 143 mmol/L (ref 134–144)
Total Bilirubin: 0.6 mg/dL (ref 0.0–1.2)
Total Protein: 6.8 g/dL (ref 6.0–8.5)

## 2013-07-05 LAB — NMR, LIPOPROFILE
Cholesterol: 134 mg/dL (ref <200)
HDL Cholesterol by NMR: 43 mg/dL (ref >=40)
HDL Particle Number: 33 umol/L (ref >=30.5)
LDL Particle Number: 1292 nmol/L — ABNORMAL HIGH (ref <1000)
LDL Size: 20 nm — ABNORMAL LOW (ref >20.5)
LDLC SERPL CALC-MCNC: 57 mg/dL (ref <100)
LP-IR Score: 55 — ABNORMAL HIGH (ref <=45)
Small LDL Particle Number: 919 nmol/L — ABNORMAL HIGH (ref <=527)
Triglycerides by NMR: 171 mg/dL — ABNORMAL HIGH (ref <150)

## 2013-07-18 ENCOUNTER — Other Ambulatory Visit: Payer: Self-pay | Admitting: Cardiovascular Disease

## 2013-07-27 ENCOUNTER — Other Ambulatory Visit: Payer: Self-pay | Admitting: Family Medicine

## 2013-08-02 ENCOUNTER — Ambulatory Visit: Payer: Medicare Other | Admitting: Family Medicine

## 2013-08-13 ENCOUNTER — Encounter: Payer: Self-pay | Admitting: *Deleted

## 2013-09-19 ENCOUNTER — Other Ambulatory Visit: Payer: Self-pay | Admitting: Family Medicine

## 2013-09-25 ENCOUNTER — Other Ambulatory Visit: Payer: Self-pay | Admitting: Family Medicine

## 2013-09-25 ENCOUNTER — Telehealth: Payer: Self-pay | Admitting: Family Medicine

## 2013-09-25 MED ORDER — BENZONATATE 200 MG PO CAPS: 200.0000 mg | ORAL_CAPSULE | Freq: Two times a day (BID) | ORAL | Status: DC | PRN

## 2013-09-25 NOTE — Telephone Encounter (Signed)
Patient needs to be seen. Her cough needs to be assessed.  Limited quantity filled for tessalon Needs to bring all medications to next appointment.

## 2013-09-26 ENCOUNTER — Telehealth: Payer: Self-pay | Admitting: Family Medicine

## 2013-09-27 NOTE — Telephone Encounter (Signed)
Patient aware but does not want to be seen.

## 2013-09-27 NOTE — Telephone Encounter (Signed)
Need to be seen. Patient aware.

## 2013-11-01 ENCOUNTER — Ambulatory Visit: Payer: Medicare Other | Admitting: Family Medicine

## 2013-11-02 ENCOUNTER — Ambulatory Visit (INDEPENDENT_AMBULATORY_CARE_PROVIDER_SITE_OTHER): Payer: Medicare Other | Admitting: Family Medicine

## 2013-11-02 ENCOUNTER — Encounter: Payer: Self-pay | Admitting: Family Medicine

## 2013-11-02 VITALS — BP 135/90 | HR 82 | Temp 98.7°F | Ht 62.0 in | Wt 246.8 lb

## 2013-11-02 DIAGNOSIS — N183 Chronic kidney disease, stage 3 unspecified: Secondary | ICD-10-CM | POA: Diagnosis not present

## 2013-11-02 DIAGNOSIS — I2510 Atherosclerotic heart disease of native coronary artery without angina pectoris: Secondary | ICD-10-CM

## 2013-11-02 DIAGNOSIS — E785 Hyperlipidemia, unspecified: Secondary | ICD-10-CM | POA: Diagnosis not present

## 2013-11-02 DIAGNOSIS — E669 Obesity, unspecified: Secondary | ICD-10-CM

## 2013-11-02 DIAGNOSIS — I872 Venous insufficiency (chronic) (peripheral): Secondary | ICD-10-CM

## 2013-11-02 DIAGNOSIS — E119 Type 2 diabetes mellitus without complications: Secondary | ICD-10-CM

## 2013-11-02 DIAGNOSIS — I739 Peripheral vascular disease, unspecified: Secondary | ICD-10-CM

## 2013-11-02 DIAGNOSIS — R609 Edema, unspecified: Secondary | ICD-10-CM

## 2013-11-02 DIAGNOSIS — I6529 Occlusion and stenosis of unspecified carotid artery: Secondary | ICD-10-CM

## 2013-11-02 DIAGNOSIS — I10 Essential (primary) hypertension: Secondary | ICD-10-CM

## 2013-11-02 DIAGNOSIS — F17200 Nicotine dependence, unspecified, uncomplicated: Secondary | ICD-10-CM | POA: Diagnosis not present

## 2013-11-02 LAB — POCT URINALYSIS DIPSTICK
Bilirubin, UA: NEGATIVE
Glucose, UA: NEGATIVE
Ketones, UA: NEGATIVE
Leukocytes, UA: NEGATIVE
Nitrite, UA: NEGATIVE
Protein, UA: NEGATIVE
Spec Grav, UA: <=1.005
Urobilinogen, UA: NEGATIVE
pH, UA: 5

## 2013-11-02 LAB — POCT UA - MICROSCOPIC ONLY
Bacteria, U Microscopic: NEGATIVE
Casts, Ur, LPF, POC: NEGATIVE
Crystals, Ur, HPF, POC: NEGATIVE
Mucus, UA: NEGATIVE
WBC, Ur, HPF, POC: NEGATIVE
Yeast, UA: NEGATIVE

## 2013-11-02 LAB — POCT GLYCOSYLATED HEMOGLOBIN (HGB A1C): Hemoglobin A1C: 6

## 2013-11-02 MED ORDER — LISINOPRIL-HYDROCHLOROTHIAZIDE 20-12.5 MG PO TABS: 1.0000 | ORAL_TABLET | Freq: Two times a day (BID) | ORAL | Status: DC

## 2013-11-02 NOTE — Progress Notes (Signed)
Patient ID: [name] [name], female   DOB: 08-Jul-1936, 78 y.o.   MRN: [id number] SUBJECTIVE: CC: Chief Complaint  Patient presents with  . Follow-up    4 month follow up chronic problems     HPI: Patient is here for follow up of Diabetes Mellitus/Obesity/CKD/CAD/Tobacco smoker: Symptoms evaluated: Denies Nocturia ,Denies Urinary Frequency , denies Blurred vision ,deniesDizziness,denies.Dysuria,denies paresthesias, denies extremity pain or ulcers.Marland Kitchendenies chest pain. has had an annual eye exam. do check the feet. Does check CBGs. Average CBG:has been stable Denies episodes of hypoglycemia. Does have an emergency hypoglycemic plan. admits toCompliance with medications. Denies Problems with medications.  Past Medical History  Diagnosis Date  . Bruit   . Hyperlipidemia   . HTN (hypertension)   . CAD   . PVD   . VENOUS INSUFFICIENCY   . DM   . OBESITY   . Edema   . PONV (postoperative nausea and vomiting)   . Angina 1995  . Carotid artery occlusion   . Myocardial infarction 1995   Past Surgical History  Procedure Laterality Date  . Coronary angioplasty with stent placement  2006  . Cataract extraction w/phaco  11/01/2011    Procedure: CATARACT EXTRACTION PHACO AND INTRAOCULAR LENS PLACEMENT (IOC);  Surgeon: Tonny Branch, MD;  Location: [facility];  Service: Ophthalmology;  Laterality: Right;  CDE:12.21  . Cataract extraction w/phaco  01/31/2012    Procedure: CATARACT EXTRACTION PHACO AND INTRAOCULAR LENS PLACEMENT (IOC);  Surgeon: Tonny Branch, MD;  Location: [facility];  Service: Ophthalmology;  Laterality: Left;  CDE:11.77   History   Social History  . Marital Status: Widowed    Spouse Name: N/A    Number of Children: N/A  . Years of Education: N/A   Occupational History  . Not on file.   Social History Main Topics  . Smoking status: Current Every Day Smoker -- 0.50 packs/day for 53 years    Types: Cigarettes  . Smokeless tobacco: Never Used     Comment: smoker since age 53  .  Alcohol Use: No  . Drug Use: No  . Sexual Activity: Yes    Birth Control/ Protection: Post-menopausal   Other Topics Concern  . Not on file   Social History Narrative  . No narrative on file   Family History  Problem Relation Age of Onset  . Anesthesia problems Neg Hx   . Hypotension Neg Hx   . Malignant hyperthermia Neg Hx   . Pseudochol deficiency Neg Hx    Current Outpatient Prescriptions on File Prior to Visit  Medication Sig Dispense Refill  . ALPRAZolam (XANAX) 0.25 MG tablet prn      . atorvastatin (LIPITOR) 80 MG tablet Take 1 tablet (80 mg total) by mouth daily.  90 tablet  3  . benzonatate (TESSALON) 200 MG capsule Take 1 capsule (200 mg total) by mouth 2 (two) times daily as needed for cough.  20 capsule  0  . cilostazol (PLETAL) 100 MG tablet Take 1 tablet (100 mg total) by mouth 2 (two) times daily.  180 tablet  3  . clopidogrel (PLAVIX) 75 MG tablet TAKE 1 TABLET BY MOUTH DAILY  90 tablet  3  . glimepiride (AMARYL) 2 MG tablet TAKE 1 TABLET (2 MG TOTAL) BY MOUTH DAILY BEFORE BREAKFAST.  30 tablet  0  . glucose blood (ACCU-CHEK AVIVA) test strip Use as instructed  100 each  0  . linagliptin (TRADJENTA) 5 MG TABS tablet Take 1 tablet (5 mg total) by mouth daily.  90 tablet  3  . metoprolol succinate (TOPROL-XL) 50 MG 24 hr tablet TAKE 1 AND 1/2 TABLETS BY MOUTH 2 (TWO) TIMES DAILY.  270 tablet  1  . nitroGLYCERIN (NITROSTAT) 0.4 MG SL tablet Place 1 tablet (0.4 mg total) under the tongue every 5 (five) minutes as needed.  25 tablet  11  . VOLTAREN 1 % GEL USE 4 GRAMS TO BOTH HANDS 4 TIMES A DAY  300 g  0  . amoxicillin (AMOXIL) 875 MG tablet Take 1 tablet (875 mg total) by mouth 2 (two) times daily.  20 tablet  0   No current facility-administered medications on file prior to visit.   No Known Allergies Immunization History  Administered Date(s) Administered  . Pneumococcal Polysaccharide-23 04/17/2012  . Td 08/13/2009   Prior to Admission medications    Medication Sig Start Date End Date Taking? Authorizing Provider  ALPRAZolam Duanne Moron) 0.25 MG tablet prn 04/28/12  Yes Historical Provider, MD  atorvastatin (LIPITOR) 80 MG tablet Take 1 tablet (80 mg total) by mouth daily. 12/12/12  Yes Josue Hector, MD  benzonatate (TESSALON) 200 MG capsule Take 1 capsule (200 mg total) by mouth 2 (two) times daily as needed for cough. 09/25/13  Yes Vernie Shanks, MD  cilostazol (PLETAL) 100 MG tablet Take 1 tablet (100 mg total) by mouth 2 (two) times daily. 12/12/12  Yes Josue Hector, MD  clopidogrel (PLAVIX) 75 MG tablet TAKE 1 TABLET BY MOUTH DAILY 12/12/12  Yes Josue Hector, MD  glimepiride (AMARYL) 2 MG tablet TAKE 1 TABLET (2 MG TOTAL) BY MOUTH DAILY BEFORE BREAKFAST.   Yes Vernie Shanks, MD  glucose blood (ACCU-CHEK AVIVA) test strip Use as instructed 02/05/13  Yes Chipper Herb, MD  linagliptin (TRADJENTA) 5 MG TABS tablet Take 1 tablet (5 mg total) by mouth daily. 12/12/12  Yes Josue Hector, MD  lisinopril-hydrochlorothiazide (PRINZIDE,ZESTORETIC) 20-12.5 MG per tablet Take 1 tablet by mouth daily. 12/12/12  Yes Josue Hector, MD  metoprolol succinate (TOPROL-XL) 50 MG 24 hr tablet TAKE 1 AND 1/2 TABLETS BY MOUTH 2 (TWO) TIMES DAILY. 07/18/13  Yes Josue Hector, MD  nitroGLYCERIN (NITROSTAT) 0.4 MG SL tablet Place 1 tablet (0.4 mg total) under the tongue every 5 (five) minutes as needed. 12/12/12  Yes Josue Hector, MD  VOLTAREN 1 % GEL USE 4 GRAMS TO BOTH HANDS 4 TIMES A DAY 05/11/13  Yes Zadaya-Margaret Hassell Done, FNP  amoxicillin (AMOXIL) 875 MG tablet Take 1 tablet (875 mg total) by mouth 2 (two) times daily. 07/03/13   Vernie Shanks, MD     ROS: As above in the HPI. All other systems are stable or negative.  OBJECTIVE: APPEARANCE:  Patient in no acute distress.The patient appeared well nourished and normally developed. Acyanotic. Waist: VITAL SIGNS:BP 135/90  Pulse 82  Temp(Src) 98.7 F (37.1 C) (Oral)  Ht _0  (1.575 m)  Wt 246 lb 12.8  oz (111.948 kg)  BMI 45.13 kg/m2  Morbidly Obese WF SKIN: warm and  Dry without overt rashes, tattoos and scars  HEAD and Neck: without JVD, Head and scalp: normal Eyes:No scleral icterus. Fundi normal, eye movements normal. Ears: Auricle normal, canal normal, Tympanic membranes normal, insufflation normal. Nose: normal Throat: normal Neck & thyroid: normal  CHEST & LUNGS: Chest wall: normal Lungs: Clear  CVS: Reveals the PMI to be normally located. Regular rhythm, First and Second Heart sounds are normal,  absence of murmurs, rubs or gallops. Peripheral vasculature:  Radial pulses: normal Dorsal pedis pulses: normal Posterior pulses: normal  ABDOMEN:  Appearance: Obese Benign, no organomegaly, no masses, no Abdominal Aortic enlargement. No Guarding , no rebound. No Bruits. Bowel sounds: normal  RECTAL: N/A GU: N/A  EXTREMETIES: nonedematous.  MUSCULOSKELETAL:  Spine: normal Joints: intact  NEUROLOGIC: oriented to time,place and person; nonfocal. Cranial Nerves are normal.  ASSESSMENT: SMOKER  PVD  OBESITY  HYPERTENSION - Plan: CMP14+EGFR, lisinopril-hydrochlorothiazide (PRINZIDE,ZESTORETIC) 20-12.5 MG per tablet  HYPERLIPIDEMIA - Plan: CMP14+EGFR, NMR, lipoprofile  DM - Plan: POCT glycosylated hemoglobin (Hb A1C), CMP14+EGFR  CAROTID ARTERY DISEASE  VENOUS INSUFFICIENCY  CAD  Edema  CKD (chronic kidney disease), stage III - Plan: POCT urinalysis dipstick, POCT UA - Microscopic Only BP not at goal.   PLAN:  BP medication adjusted to attain goal.  DASH diet, DM foot care, and tobacco cessation counseled and handouts in the AVS.  Orders Placed This Encounter  Procedures  . CMP14+EGFR  . NMR, lipoprofile  . POCT glycosylated hemoglobin (Hb A1C)  . POCT urinalysis dipstick  . POCT UA - Microscopic Only   Meds ordered this encounter  Medications  . lisinopril-hydrochlorothiazide (PRINZIDE,ZESTORETIC) 20-12.5 MG per tablet    Sig: Take 1  tablet by mouth 2 (two) times daily.    Dispense:  180 tablet    Refill:  3   Medications Discontinued During This Encounter  Medication Reason  . lisinopril-hydrochlorothiazide (PRINZIDE,ZESTORETIC) 20-12.5 MG per tablet Reorder  discussed for 30 minutes LTC needed.  Return in about 2 weeks (around 11/16/2013) for recheck BP.  Sonyia Muro P. Jacelyn Grip, M.D.

## 2013-11-02 NOTE — Patient Instructions (Addendum)
Smoking Cessation Quitting smoking is important to your health and has many advantages. However, it is not always easy to quit since nicotine is a very addictive drug. Often times, people try 3 times or more before being able to quit. This document explains the best ways for you to prepare to quit smoking. Quitting takes hard work and a lot of effort, but you can do it. ADVANTAGES OF QUITTING SMOKING  You will live longer, feel better, and live better.  Your body will feel the impact of quitting smoking almost immediately.  Within 20 minutes, blood pressure decreases. Your pulse returns to its normal level.  After 8 hours, carbon monoxide levels in the blood return to normal. Your oxygen level increases.  After 24 hours, the chance of having a heart attack starts to decrease. Your breath, hair, and body stop smelling like smoke.  After 48 hours, damaged nerve endings begin to recover. Your sense of taste and smell improve.  After 72 hours, the body is virtually free of nicotine. Your bronchial tubes relax and breathing becomes easier.  After 2 to 12 weeks, lungs can hold more air. Exercise becomes easier and circulation improves.  The risk of having a heart attack, stroke, cancer, or lung disease is greatly reduced.  After 1 year, the risk of coronary heart disease is cut in half.  After 5 years, the risk of stroke falls to the same as a nonsmoker.  After 10 years, the risk of lung cancer is cut in half and the risk of other cancers decreases significantly.  After 15 years, the risk of coronary heart disease drops, usually to the level of a nonsmoker.  If you are pregnant, quitting smoking will improve your chances of having a healthy baby.  The people you live with, especially any children, will be healthier.  You will have extra money to spend on things other than cigarettes. QUESTIONS TO THINK ABOUT BEFORE ATTEMPTING TO QUIT You may want to talk about your answers with your  caregiver.  Why do you want to quit?  If you tried to quit in the past, what helped and what did not?  What will be the most difficult situations for you after you quit? How will you plan to handle them?  Who can help you through the tough times? Your family? Friends? A caregiver?  What pleasures do you get from smoking? What ways can you still get pleasure if you quit? Here are some questions to ask your caregiver:  How can you help me to be successful at quitting?  What medicine do you think would be best for me and how should I take it?  What should I do if I need more help?  What is smoking withdrawal like? How can I get information on withdrawal? GET READY  Set a quit date.  Change your environment by getting rid of all cigarettes, ashtrays, matches, and lighters in your home, car, or work. Do not let people smoke in your home.  Review your past attempts to quit. Think about what worked and what did not. GET SUPPORT AND ENCOURAGEMENT You have a better chance of being successful if you have help. You can get support in many ways.  Tell your family, friends, and co-workers that you are going to quit and need their support. Ask them not to smoke around you.  Get individual, group, or telephone counseling and support. Programs are available at local hospitals and health centers. Call your local health department for   information about programs in your area.  Spiritual beliefs and practices may help some smokers quit.  Download a "quit meter" on your computer to keep track of quit statistics, such as how long you have gone without smoking, cigarettes not smoked, and money saved.  Get a self-help book about quitting smoking and staying off of tobacco. [place] yourself from urges to smoke. Talk to someone, go for a walk, or occupy your time with a task.  Change your normal routine. Take a different route to work. Drink tea instead of coffee.  Eat breakfast in a different place.  Reduce your stress. Take a hot bath, exercise, or read a book.  Plan something enjoyable to do every day. Reward yourself for not smoking.  Explore interactive web-based programs that specialize in helping you quit. GET MEDICINE AND USE IT CORRECTLY Medicines can help you stop smoking and decrease the urge to smoke. Combining medicine with the above behavioral methods and support can greatly increase your chances of successfully quitting smoking.  Nicotine replacement therapy helps deliver nicotine to your body without the negative effects and risks of smoking. Nicotine replacement therapy includes nicotine gum, lozenges, inhalers, nasal sprays, and skin patches. Some may be available over-the-counter and others require a prescription.  Antidepressant medicine helps people abstain from smoking, but how this works is unknown. This medicine is available by prescription.  Nicotinic receptor partial agonist medicine simulates the effect of nicotine in your brain. This medicine is available by prescription. Ask your caregiver for advice about which medicines to use and how to use them based on your health history. Your caregiver will tell you what side effects to look out for if you choose to be on a medicine or therapy. Carefully read the information on the package. Do not use any other product containing nicotine while using a nicotine replacement product.  RELAPSE OR DIFFICULT SITUATIONS Most relapses occur within the first 3 months after quitting. Do not be discouraged if you start smoking again. Remember, most people try several times before finally quitting. You may have symptoms of withdrawal because your body is used to nicotine. You may crave cigarettes, be irritable, feel very hungry, cough often, get headaches, or have difficulty concentrating. The withdrawal symptoms are only temporary. They are strongest when you first quit, but they will go away within  10 14 days. To reduce the chances of relapse, try to:  Avoid drinking alcohol. Drinking lowers your chances of successfully quitting.  Reduce the amount of caffeine you consume. Once you quit smoking, the amount of caffeine in your body increases and can give you symptoms, such as a rapid heartbeat, sweating, and anxiety.  Avoid smokers because they can make you want to smoke.  Do not let weight gain distract you. Many smokers will gain weight when they quit, usually less than 10 pounds. Eat a healthy diet and stay active. You can always lose the weight gained after you quit.  Find ways to improve your mood other than smoking. FOR MORE INFORMATION  www.smokefree.gov  Document Released: 07/06/2001 Document Revised: 01/11/2012 Document Reviewed: 10/21/2011 [facility] Patient Information 2014 [place], [place]. Diabetes and Foot Care Diabetes may cause you to have problems because of poor blood supply (circulation) to your feet and legs. This may cause the skin on your feet to become thinner, break easier, and heal more slowly. Your skin may become dry, and the skin may peel and crack. You may also have nerve damage in  your legs and feet causing decreased feeling in them. You may not notice minor injuries to your feet that could lead to infections or more serious problems. Taking care of your feet is one of the most important things you can do for yourself.  HOME CARE INSTRUCTIONS  Wear shoes at all times, even in the house. Do not go barefoot. Bare feet are easily injured.  Check your feet daily for blisters, cuts, and redness. If you cannot see the bottom of your feet, use a mirror or ask someone for help.  Wash your feet with warm water (do not use hot water) and mild soap. Then pat your feet and the areas between your toes until they are completely dry. Do not soak your feet as this can dry your skin.  Apply a moisturizing lotion or petroleum jelly (that does not contain alcohol and is  unscented) to the skin on your feet and to dry, brittle toenails. Do not apply lotion between your toes.  Trim your toenails straight across. Do not dig under them or around the cuticle. File the edges of your nails with an emery board or nail file.  Do not cut corns or calluses or try to remove them with medicine.  Wear clean socks or stockings every day. Make sure they are not too tight. Do not wear knee-high stockings since they may decrease blood flow to your legs.  Wear shoes that fit properly and have enough cushioning. To break in new shoes, wear them for just a few hours a day. This prevents you from injuring your feet. Always look in your shoes before you put them on to be sure there are no objects inside.  Do not cross your legs. This may decrease the blood flow to your feet.  If you find a minor scrape, cut, or break in the skin on your feet, keep it and the skin around it clean and dry. These areas may be cleansed with mild soap and water. Do not cleanse the area with peroxide, alcohol, or iodine.  When you remove an adhesive bandage, be sure not to damage the skin around it.  If you have a wound, look at it several times a day to make sure it is healing.  Do not use heating pads or hot water bottles. They may burn your skin. If you have lost feeling in your feet or legs, you may not know it is happening until it is too late.  Make sure your health care provider performs a complete foot exam at least annually or more often if you have foot problems. Report any cuts, sores, or bruises to your health care provider immediately. SEEK MEDICAL CARE IF:   You have an injury that is not healing.  You have cuts or breaks in the skin.  You have an ingrown nail.  You notice redness on your legs or feet.  You feel burning or tingling in your legs or feet.  You have pain or cramps in your legs and feet.  Your legs or feet are numb.  Your feet always feel cold. SEEK IMMEDIATE  MEDICAL CARE IF:   There is increasing redness, swelling, or pain in or around a wound.  There is a red line that goes up your leg.  Pus is coming from a wound.  You develop a fever or as directed by your health care provider.  You notice a bad smell coming from an ulcer or wound. Document Released: 07/09/2000 Document Revised: 03/14/2013  Document Reviewed: 12/19/2012 [facility] Patient Information 2014 [place].    DASH Diet The DASH diet stands for "Dietary Approaches to Stop Hypertension." It is a healthy eating plan that has been shown to reduce high blood pressure (hypertension) in as little as 14 days, while also possibly providing other significant health benefits. These other health benefits include reducing the risk of breast cancer after menopause and reducing the risk of type 2 diabetes, heart disease, colon cancer, and stroke. Health benefits also include weight loss and slowing kidney failure in patients with chronic kidney disease.  DIET GUIDELINES  Limit salt (sodium). Your diet should contain less than 1500 mg of sodium daily.  Limit refined or processed carbohydrates. Your diet should include mostly whole grains. Desserts and added sugars should be used sparingly.  Include small amounts of heart-healthy fats. These types of fats include nuts, oils, and tub margarine. Limit saturated and trans fats. These fats have been shown to be harmful in the body. CHOOSING FOODS  The following food groups are based on a 2000 calorie diet. See your Registered Dietitian for individual calorie needs. Grains and Grain Products (6 to 8 servings daily)  Eat More Often: Whole-wheat bread, brown rice, whole-grain or wheat pasta, quinoa, popcorn without added fat or salt (air popped).  Eat Less Often: White bread, white pasta, white rice, cornbread. Vegetables (4 to 5 servings daily)  Eat More Often: Fresh, frozen, and canned vegetables. Vegetables may be raw, steamed, roasted, or  grilled with a minimal amount of fat.  Eat Less Often/Avoid: Creamed or fried vegetables. Vegetables in a cheese sauce. Fruit (4 to 5 servings daily)  Eat More Often: All fresh, canned (in natural juice), or frozen fruits. Dried fruits without added sugar. One hundred percent fruit juice ( cup [237 mL] daily).  Eat Less Often: Dried fruits with added sugar. Canned fruit in light or heavy syrup. [employer], Fish, and Poultry (2 servings or less daily. One serving is 3 to 4 oz [85-114 g]).  Eat More Often: Ninety percent or leaner ground beef, tenderloin, sirloin. Round cuts of beef, chicken breast, [place] breast. All fish. Grill, bake, or broil your meat. Nothing should be fried.  Eat Less Often/Avoid: Fatty cuts of meat, [place], or chicken leg, thigh, or wing. Fried cuts of meat or fish. Dairy (2 to 3 servings)  Eat More Often: Low-fat or fat-free milk, low-fat plain or light yogurt, reduced-fat or part-skim cheese.  Eat Less Often/Avoid: Milk (whole, 2%).Whole milk yogurt. Full-fat cheeses. Nuts, Seeds, and Legumes (4 to 5 servings per week)  Eat More Often: All without added salt.  Eat Less Often/Avoid: Salted nuts and seeds, canned beans with added salt. Fats and Sweets (limited)  Eat More Often: Vegetable oils, tub margarines without trans fats, sugar-free gelatin. Mayonnaise and salad dressings.  Eat Less Often/Avoid: Coconut oils, palm oils, butter, stick margarine, cream, half and half, cookies, candy, pie. FOR MORE INFORMATION The Dash Diet Eating Plan: www.dashdiet.org Document Released: 07/01/2011 Document Revised: 10/04/2011 Document Reviewed: 07/01/2011 [facility] Patient Information 2014 [place], [place].

## 2013-11-04 LAB — CMP14+EGFR
ALT: 10 IU/L (ref 0–32)
AST: 16 IU/L (ref 0–40)
Albumin/Globulin Ratio: 1.5 (ref 1.1–2.5)
Albumin: 4 g/dL (ref 3.5–4.8)
Alkaline Phosphatase: 138 IU/L — ABNORMAL HIGH (ref 39–117)
BUN/Creatinine Ratio: 13 (ref 11–26)
BUN: 20 mg/dL (ref 8–27)
CO2: 24 mmol/L (ref 18–29)
Calcium: 9.1 mg/dL (ref 8.7–10.3)
Chloride: 101 mmol/L (ref 97–108)
Creatinine, Ser: 1.52 mg/dL — ABNORMAL HIGH (ref 0.57–1.00)
GFR calc Af Amer: 38 mL/min/{1.73_m2} — ABNORMAL LOW (ref >59)
GFR calc non Af Amer: 33 mL/min/{1.73_m2} — ABNORMAL LOW (ref >59)
Globulin, Total: 2.7 g/dL (ref 1.5–4.5)
Glucose: 112 mg/dL — ABNORMAL HIGH (ref 65–99)
Potassium: 4.8 mmol/L (ref 3.5–5.2)
Sodium: 144 mmol/L (ref 134–144)
Total Bilirubin: 0.8 mg/dL (ref 0.0–1.2)
Total Protein: 6.7 g/dL (ref 6.0–8.5)

## 2013-11-04 LAB — NMR, LIPOPROFILE
Cholesterol: 132 mg/dL (ref <200)
HDL Cholesterol by NMR: 45 mg/dL (ref >=40)
HDL Particle Number: 32.6 umol/L (ref >=30.5)
LDL Particle Number: 1025 nmol/L — ABNORMAL HIGH (ref <1000)
LDL Size: 20.8 nm (ref >20.5)
LDLC SERPL CALC-MCNC: 52 mg/dL (ref <100)
LP-IR Score: 57 — ABNORMAL HIGH (ref <=45)
Small LDL Particle Number: 459 nmol/L (ref <=527)
Triglycerides by NMR: 175 mg/dL — ABNORMAL HIGH (ref <150)

## 2013-11-04 NOTE — Progress Notes (Signed)
Quick Note:  Call Patient Labs that are abnormal: There is some blood in the urine seen under the microscope.  The rest are at goal And looks good  Recommendations: I believe that she should have her urine rechecked. Will do this in 2 weeks. The rest is good. No changes in medication doses.   ______

## 2013-11-11 ENCOUNTER — Other Ambulatory Visit: Payer: Self-pay | Admitting: Family Medicine

## 2013-11-22 ENCOUNTER — Encounter: Payer: Self-pay | Admitting: Family Medicine

## 2013-11-22 ENCOUNTER — Ambulatory Visit (INDEPENDENT_AMBULATORY_CARE_PROVIDER_SITE_OTHER): Payer: Medicare Other | Admitting: Family Medicine

## 2013-11-22 VITALS — BP 110/70 | HR 94 | Temp 98.0°F | Ht 62.0 in | Wt 249.2 lb

## 2013-11-22 DIAGNOSIS — I10 Essential (primary) hypertension: Secondary | ICD-10-CM | POA: Diagnosis not present

## 2013-11-22 DIAGNOSIS — E785 Hyperlipidemia, unspecified: Secondary | ICD-10-CM | POA: Diagnosis not present

## 2013-11-22 DIAGNOSIS — E669 Obesity, unspecified: Secondary | ICD-10-CM

## 2013-11-22 DIAGNOSIS — R8290 Unspecified abnormal findings in urine: Secondary | ICD-10-CM

## 2013-11-22 DIAGNOSIS — R82998 Other abnormal findings in urine: Secondary | ICD-10-CM | POA: Diagnosis not present

## 2013-11-22 DIAGNOSIS — E119 Type 2 diabetes mellitus without complications: Secondary | ICD-10-CM

## 2013-11-22 DIAGNOSIS — F17200 Nicotine dependence, unspecified, uncomplicated: Secondary | ICD-10-CM | POA: Diagnosis not present

## 2013-11-22 NOTE — Patient Instructions (Signed)
DASH Diet  The DASH diet stands for "Dietary Approaches to Stop Hypertension." It is a healthy eating plan that has been shown to reduce high blood pressure (hypertension) in as little as 14 days, while also possibly providing other significant health benefits. These other health benefits include reducing the risk of breast cancer after menopause and reducing the risk of type 2 diabetes, heart disease, colon cancer, and stroke. Health benefits also include weight loss and slowing kidney failure in patients with chronic kidney disease.   DIET GUIDELINES  · Limit salt (sodium). Your diet should contain less than 1500 mg of sodium daily.  · Limit refined or processed carbohydrates. Your diet should include mostly whole grains. Desserts and added sugars should be used sparingly.  · Include small amounts of heart-healthy fats. These types of fats include nuts, oils, and tub margarine. Limit saturated and trans fats. These fats have been shown to be harmful in the body.  CHOOSING FOODS   The following food groups are based on a 2000 calorie diet. See your Registered Dietitian for individual calorie needs.  Grains and Grain Products (6 to 8 servings daily)  · Eat More Often: Whole-wheat bread, brown rice, whole-grain or wheat pasta, quinoa, popcorn without added fat or salt (air popped).  · Eat Less Often: White bread, white pasta, white rice, cornbread.  Vegetables (4 to 5 servings daily)  · Eat More Often: Fresh, frozen, and canned vegetables. Vegetables may be raw, steamed, roasted, or grilled with a minimal amount of fat.  · Eat Less Often/Avoid: Creamed or fried vegetables. Vegetables in a cheese sauce.  Fruit (4 to 5 servings daily)  · Eat More Often: All fresh, canned (in natural juice), or frozen fruits. Dried fruits without added sugar. One hundred percent fruit juice (½ cup [237 mL] daily).  · Eat Less Often: Dried fruits with added sugar. Canned fruit in light or heavy syrup.  Lean Meats, Fish, and Poultry (2  servings or less daily. One serving is 3 to 4 oz [85-114 g]).  · Eat More Often: Ninety percent or leaner ground beef, tenderloin, sirloin. Round cuts of beef, chicken breast, turkey breast. All fish. Grill, bake, or broil your meat. Nothing should be fried.  · Eat Less Often/Avoid: Fatty cuts of meat, turkey, or chicken leg, thigh, or wing. Fried cuts of meat or fish.  Dairy (2 to 3 servings)  · Eat More Often: Low-fat or fat-free milk, low-fat plain or light yogurt, reduced-fat or part-skim cheese.  · Eat Less Often/Avoid: Milk (whole, 2%). Whole milk yogurt. Full-fat cheeses.  Nuts, Seeds, and Legumes (4 to 5 servings per week)  · Eat More Often: All without added salt.  · Eat Less Often/Avoid: Salted nuts and seeds, canned beans with added salt.  Fats and Sweets (limited)  · Eat More Often: Vegetable oils, tub margarines without trans fats, sugar-free gelatin. Mayonnaise and salad dressings.  · Eat Less Often/Avoid: Coconut oils, palm oils, butter, stick margarine, cream, half and half, cookies, candy, pie.  FOR MORE INFORMATION  The Dash Diet Eating Plan: www.dashdiet.org  Document Released: 07/01/2011 Document Revised: 10/04/2011 Document Reviewed: 07/01/2011  Exit[employer]® Patient Information ©2014 [employer].

## 2013-11-22 NOTE — Progress Notes (Signed)
Patient ID: [name] [name], female   DOB: February 24, 1936, 78 y.o.   MRN: [id number] SUBJECTIVE: CC: Chief Complaint  Patient presents with  . Hypertension    recheck    HPI: Here mainly to recheck the BP Patient is here for follow up of hypertension: denies Headache;deniesChest Pain;denies weakness;denies Shortness of Breath or Orthopnea;denies Visual changes;denies palpitations;denies cough;denies pedal edema;denies symptoms of TIA or stroke; admits to Compliance with medications. denies Problems with medications. No orthostatic symptoms feels fine. Past Medical History  Diagnosis Date  . Bruit   . Hyperlipidemia   . HTN (hypertension)   . CAD   . PVD   . VENOUS INSUFFICIENCY   . DM   . OBESITY   . Edema   . PONV (postoperative nausea and vomiting)   . Angina 1995  . Carotid artery occlusion   . Myocardial infarction 1995   Past Surgical History  Procedure Laterality Date  . Coronary angioplasty with stent placement  2006  . Cataract extraction w/phaco  11/01/2011    Procedure: CATARACT EXTRACTION PHACO AND INTRAOCULAR LENS PLACEMENT (IOC);  Surgeon: Tonny Branch, MD;  Location: [facility];  Service: Ophthalmology;  Laterality: Right;  CDE:12.21  . Cataract extraction w/phaco  01/31/2012    Procedure: CATARACT EXTRACTION PHACO AND INTRAOCULAR LENS PLACEMENT (IOC);  Surgeon: Tonny Branch, MD;  Location: [facility];  Service: Ophthalmology;  Laterality: Left;  CDE:11.77   History   Social History  . Marital Status: Widowed    Spouse Name: N/A    Number of Children: N/A  . Years of Education: N/A   Occupational History  . Not on file.   Social History Main Topics  . Smoking status: Current Every Day Smoker -- 0.50 packs/day for 53 years    Types: Cigarettes  . Smokeless tobacco: Never Used     Comment: smoker since age 62  . Alcohol Use: No  . Drug Use: No  . Sexual Activity: Yes    Birth Control/ Protection: Post-menopausal   Other Topics Concern  . Not on file   Social History  Narrative  . No narrative on file   Family History  Problem Relation Age of Onset  . Anesthesia problems Neg Hx   . Hypotension Neg Hx   . Malignant hyperthermia Neg Hx   . Pseudochol deficiency Neg Hx    Current Outpatient Prescriptions on File Prior to Visit  Medication Sig Dispense Refill  . atorvastatin (LIPITOR) 80 MG tablet Take 1 tablet (80 mg total) by mouth daily.  90 tablet  3  . cilostazol (PLETAL) 100 MG tablet Take 1 tablet (100 mg total) by mouth 2 (two) times daily.  180 tablet  3  . clopidogrel (PLAVIX) 75 MG tablet TAKE 1 TABLET BY MOUTH DAILY  90 tablet  3  . glimepiride (AMARYL) 2 MG tablet TAKE 1 TABLET (2 MG TOTAL) BY MOUTH DAILY BEFORE BREAKFAST.  30 tablet  2  . glucose blood (ACCU-CHEK AVIVA) test strip Use as instructed  100 each  0  . linagliptin (TRADJENTA) 5 MG TABS tablet Take 1 tablet (5 mg total) by mouth daily.  90 tablet  3  . lisinopril-hydrochlorothiazide (PRINZIDE,ZESTORETIC) 20-12.5 MG per tablet Take 1 tablet by mouth 2 (two) times daily.  180 tablet  3  . metoprolol succinate (TOPROL-XL) 50 MG 24 hr tablet TAKE 1 AND 1/2 TABLETS BY MOUTH 2 (TWO) TIMES DAILY.  270 tablet  1  . VOLTAREN 1 % GEL USE 4 GRAMS TO BOTH HANDS  4 TIMES A DAY  300 g  0  . ALPRAZolam (XANAX) 0.25 MG tablet prn      . nitroGLYCERIN (NITROSTAT) 0.4 MG SL tablet Place 1 tablet (0.4 mg total) under the tongue every 5 (five) minutes as needed.  25 tablet  11   No current facility-administered medications on file prior to visit.   No Known Allergies Immunization History  Administered Date(s) Administered  . Pneumococcal Polysaccharide-23 04/17/2012  . Td 08/13/2009   Prior to Admission medications   Medication Sig Start Date End Date Taking? Authorizing Provider  atorvastatin (LIPITOR) 80 MG tablet Take 1 tablet (80 mg total) by mouth daily. 12/12/12  Yes Josue Hector, MD  cilostazol (PLETAL) 100 MG tablet Take 1 tablet (100 mg total) by mouth 2 (two) times daily. 12/12/12  Yes  Josue Hector, MD  clopidogrel (PLAVIX) 75 MG tablet TAKE 1 TABLET BY MOUTH DAILY 12/12/12  Yes Josue Hector, MD  glimepiride (AMARYL) 2 MG tablet TAKE 1 TABLET (2 MG TOTAL) BY MOUTH DAILY BEFORE BREAKFAST.   Yes Vernie Shanks, MD  glucose blood (ACCU-CHEK AVIVA) test strip Use as instructed 02/05/13  Yes Chipper Herb, MD  linagliptin (TRADJENTA) 5 MG TABS tablet Take 1 tablet (5 mg total) by mouth daily. 12/12/12  Yes Josue Hector, MD  lisinopril-hydrochlorothiazide (PRINZIDE,ZESTORETIC) 20-12.5 MG per tablet Take 1 tablet by mouth 2 (two) times daily. 11/02/13  Yes Vernie Shanks, MD  metoprolol succinate (TOPROL-XL) 50 MG 24 hr tablet TAKE 1 AND 1/2 TABLETS BY MOUTH 2 (TWO) TIMES DAILY. 07/18/13  Yes Josue Hector, MD  VOLTAREN 1 % GEL USE 4 GRAMS TO BOTH HANDS 4 TIMES A DAY 05/11/13  Yes Aurore-Margaret Hassell Done, FNP  ALPRAZolam Duanne Moron) 0.25 MG tablet prn 04/28/12   Historical Provider, MD  nitroGLYCERIN (NITROSTAT) 0.4 MG SL tablet Place 1 tablet (0.4 mg total) under the tongue every 5 (five) minutes as needed. 12/12/12   Josue Hector, MD     ROS: As above in the HPI. All other systems are stable or negative.  OBJECTIVE: APPEARANCE:  Patient in no acute distress.The patient appeared well nourished and normally developed. Acyanotic. Waist: VITAL SIGNS:BP 110/70  Pulse 94  Temp(Src) 98 F (36.7 C) (Oral)  Ht $R'5\' 2"'aV$  (1.575 m)  Wt 249 lb 3.2 oz (113.036 kg)  BMI 45.57 kg/m2 Recheck 110/80 WF morbidly obese  SKIN: warm and  Dry without overt rashes, tattoos and scars  HEAD and Neck: without JVD, Head and scalp: normal Eyes:No scleral icterus. Fundi normal, eye movements normal. Ears: Auricle normal, canal normal, Tympanic membranes normal, insufflation normal. Nose: normal Throat: normal Neck & thyroid: normal  CHEST & LUNGS: Chest wall: normal Lungs: Clear  CVS: Reveals the PMI to be normally located. Regular rhythm, First and Second Heart sounds are normal,  absence  of murmurs, rubs or gallops. Peripheral vasculature: Radial pulses: normal Dorsal pedis pulses: normal Posterior pulses: normal  ABDOMEN:  Appearance: obese Benign, no organomegaly, no masses, no Abdominal Aortic enlargement. No Guarding , no rebound. No Bruits. Bowel sounds: normal  RECTAL: N/A GU: N/A  EXTREMETIES: nonedematous.  NEUROLOGIC: oriented to time,place and person; nonfocal. Results for orders placed in visit on 11/02/13  CMP14+EGFR      Result Value Ref Range   Glucose 112 (*) 65 - 99 mg/dL   BUN 20  8 - 27 mg/dL   Creatinine, Ser 1.52 (*) 0.57 - 1.00 mg/dL   GFR calc non Af  Amer 33 (*) >59 mL/min/1.73   GFR calc Af Amer 38 (*) >59 mL/min/1.73   BUN/Creatinine Ratio 13  11 - 26   Sodium 144  134 - 144 mmol/L   Potassium 4.8  3.5 - 5.2 mmol/L   Chloride 101  97 - 108 mmol/L   CO2 24  18 - 29 mmol/L   Calcium 9.1  8.7 - 10.3 mg/dL   Total Protein 6.7  6.0 - 8.5 g/dL   Albumin 4.0  3.5 - 4.8 g/dL   Globulin, Total 2.7  1.5 - 4.5 g/dL   Albumin/Globulin Ratio 1.5  1.1 - 2.5   Total Bilirubin 0.8  0.0 - 1.2 mg/dL   Alkaline Phosphatase 138 (*) 39 - 117 IU/L   AST 16  0 - 40 IU/L   ALT 10  0 - 32 IU/L  NMR, LIPOPROFILE      Result Value Ref Range   LDL Particle Number 1025 (*) <1000 nmol/L   LDLC SERPL CALC-MCNC 52  <100 mg/dL   HDL Cholesterol by NMR 45  >=40 mg/dL   Triglycerides by NMR 175 (*) <150 mg/dL   Cholesterol 132  <200 mg/dL   HDL Particle Number 32.6  >=30.5 umol/L   Small LDL Particle Number 459  <=527 nmol/L   LDL Size 20.8  >20.5 nm   LP-IR Score 57 (*) <=45  POCT GLYCOSYLATED HEMOGLOBIN (HGB A1C)      Result Value Ref Range   Hemoglobin A1C 6.0    POCT URINALYSIS DIPSTICK      Result Value Ref Range   Color, UA yellow     Clarity, UA clear     Glucose, UA neg     Bilirubin, UA neg     Ketones, UA neg     Spec Grav, UA <=1.005     Blood, UA trace     pH, UA 5.0     Protein, UA neg     Urobilinogen, UA negative     Nitrite, UA  neg     Leukocytes, UA Negative    POCT UA - MICROSCOPIC ONLY      Result Value Ref Range   WBC, Ur, HPF, POC neg     RBC, urine, microscopic 5-10     Bacteria, U Microscopic neg     Mucus, UA neg     Epithelial cells, urine per micros occ     Crystals, Ur, HPF, POC neg     Casts, Ur, LPF, POC neg     Yeast, UA neg      ASSESSMENT:  SMOKER  OBESITY  HYPERTENSION - Plan: BMP8+EGFR  HYPERLIPIDEMIA  DM  Abnormal urinalysis - Plan: POCT urinalysis dipstick, POCT UA - Microscopic Only, Urine culture  PLAN: Discussed need to work up the microscopic hematuria. She will try to give urine today. Monitor BP dail and if BP is very low or dizziness occurs , she will need to reduce the dose of the lisinopril/hct to 1 tab daily.  Weight reduction and exercise and a plant based  Diet counselled on smoking cessation again. Patient not willing to stop. Orders Placed This Encounter  Procedures  . Urine culture  . BMP8+EGFR  . POCT urinalysis dipstick  . POCT UA - Microscopic Only   No orders of the defined types were placed in this encounter.   Medications Discontinued During This Encounter  Medication Reason  . amoxicillin (AMOXIL) 875 MG tablet   . benzonatate (TESSALON) 200 MG capsule  Return in about 6 weeks (around 01/03/2014) for recheck BP.  Symia Herdt P. Jacelyn Grip, M.D.

## 2013-11-23 ENCOUNTER — Other Ambulatory Visit: Payer: Medicare Other

## 2013-11-23 DIAGNOSIS — R82998 Other abnormal findings in urine: Secondary | ICD-10-CM | POA: Diagnosis not present

## 2013-11-23 LAB — BMP8+EGFR
BUN/Creatinine Ratio: 11 (ref 11–26)
BUN: 18 mg/dL (ref 8–27)
CO2: 24 mmol/L (ref 18–29)
Calcium: 8.7 mg/dL (ref 8.7–10.3)
Chloride: 102 mmol/L (ref 97–108)
Creatinine, Ser: 1.69 mg/dL — ABNORMAL HIGH (ref 0.57–1.00)
GFR calc Af Amer: 34 mL/min/{1.73_m2} — ABNORMAL LOW (ref >59)
GFR calc non Af Amer: 29 mL/min/{1.73_m2} — ABNORMAL LOW (ref >59)
Glucose: 95 mg/dL (ref 65–99)
Potassium: 4.1 mmol/L (ref 3.5–5.2)
Sodium: 144 mmol/L (ref 134–144)

## 2013-11-23 LAB — POCT URINALYSIS DIPSTICK
Bilirubin, UA: NEGATIVE
Glucose, UA: NEGATIVE
Ketones, UA: NEGATIVE
Nitrite, UA: POSITIVE
Protein, UA: NEGATIVE
Spec Grav, UA: 1.015
Urobilinogen, UA: NEGATIVE
pH, UA: 5

## 2013-11-23 LAB — POCT UA - MICROSCOPIC ONLY
Casts, Ur, LPF, POC: NEGATIVE
Crystals, Ur, HPF, POC: NEGATIVE
Mucus, UA: NEGATIVE
Yeast, UA: NEGATIVE

## 2013-11-23 NOTE — Progress Notes (Unsigned)
Patient came in for labs only.

## 2013-11-25 ENCOUNTER — Other Ambulatory Visit: Payer: Self-pay | Admitting: Family Medicine

## 2013-11-25 DIAGNOSIS — N390 Urinary tract infection, site not specified: Secondary | ICD-10-CM

## 2013-11-25 LAB — URINE CULTURE

## 2013-11-25 MED ORDER — SULFAMETHOXAZOLE-TRIMETHOPRIM 800-160 MG PO TABS: 1.0000 | ORAL_TABLET | Freq: Two times a day (BID) | ORAL | Status: DC

## 2013-11-25 NOTE — Progress Notes (Signed)
Quick Note:  Call Patient Labs that are abnormal:  Urine grew a significant infection. The rest are at goal  Recommendations: Start on a antibiotic for urine infection. Ordered in EPIC. Will need to see a provider in 3 to 4 weeks to recheck UTI.   ______

## 2013-12-07 ENCOUNTER — Other Ambulatory Visit (HOSPITAL_COMMUNITY): Payer: Self-pay | Admitting: *Deleted

## 2013-12-07 DIAGNOSIS — I6529 Occlusion and stenosis of unspecified carotid artery: Secondary | ICD-10-CM

## 2013-12-19 ENCOUNTER — Ambulatory Visit: Payer: Medicare Other | Admitting: Cardiovascular Disease

## 2013-12-19 ENCOUNTER — Ambulatory Visit (HOSPITAL_COMMUNITY): Payer: Medicare Other | Attending: Cardiovascular Disease | Admitting: *Deleted

## 2013-12-19 ENCOUNTER — Ambulatory Visit (INDEPENDENT_AMBULATORY_CARE_PROVIDER_SITE_OTHER): Payer: Medicare Other | Admitting: Cardiovascular Disease

## 2013-12-19 ENCOUNTER — Encounter: Payer: Self-pay | Admitting: Cardiovascular Disease

## 2013-12-19 VITALS — BP 169/73 | HR 96 | Ht 62.0 in | Wt 250.0 lb

## 2013-12-19 DIAGNOSIS — I6529 Occlusion and stenosis of unspecified carotid artery: Secondary | ICD-10-CM | POA: Insufficient documentation

## 2013-12-19 DIAGNOSIS — I10 Essential (primary) hypertension: Secondary | ICD-10-CM

## 2013-12-19 DIAGNOSIS — E119 Type 2 diabetes mellitus without complications: Secondary | ICD-10-CM | POA: Diagnosis not present

## 2013-12-19 DIAGNOSIS — E785 Hyperlipidemia, unspecified: Secondary | ICD-10-CM

## 2013-12-19 DIAGNOSIS — I2510 Atherosclerotic heart disease of native coronary artery without angina pectoris: Secondary | ICD-10-CM | POA: Diagnosis not present

## 2013-12-19 DIAGNOSIS — R609 Edema, unspecified: Secondary | ICD-10-CM

## 2013-12-19 NOTE — Assessment & Plan Note (Signed)
Cholesterol is at goal.  Continue current dose of statin and diet Rx.  No myalgias or side effects.  F/U  LFT's in 6 months. Lab Results  Component Value Date   LDLCALC 52 11/02/2013

## 2013-12-19 NOTE — Assessment & Plan Note (Signed)
Have always been concerned about LICA  Our labs new diagnostic criteria require diastolic velocity over 1.5 and Dr Scot Dock has felt she is not at surgical level of stenosis yet  Continue plavix and f/u duplex in 6 months Continue high dose statin

## 2013-12-19 NOTE — Progress Notes (Signed)
Carotid duplex complete 

## 2013-12-19 NOTE — Assessment & Plan Note (Signed)
Due to venous insuf and obesity  Continue current dose of diuretic

## 2013-12-19 NOTE — Assessment & Plan Note (Signed)
Well controlled.  Continue current medications and low sodium Dash type diet.    

## 2013-12-19 NOTE — Assessment & Plan Note (Signed)
Stable with no angina and good activity level.  Continue medical Rx  

## 2013-12-19 NOTE — Assessment & Plan Note (Signed)
Discussed low carb diet.  Target hemoglobin A1c is 6.5 or less.  Continue current medications.  

## 2013-12-19 NOTE — Patient Instructions (Signed)
Your physician wants you to follow-up in:   [place] will receive a reminder letter in the mail two months in advance. If you don't receive a letter, please call our office to schedule the follow-up appointment. Your physician recommends that you continue on your current medications as directed. Please refer to the Current Medication list given to you today.

## 2013-12-19 NOTE — Progress Notes (Signed)
Patient ID: [name] [name], female   DOB: [birth year], 78 y.o.   MRN: [id number] Lana returns today for followup. She has had previous stenting of the right coronary artery. She has had resting tachycardia. We started her on beta-blockers and she improved. We did not find a good reason forher tachycardia, although she is diabetic and may have some autonomic dysfunction. She has good LV function. She has bilateral carotid disease. I reviewed duplex from today Previously was concerned since her systolic velocities were greater than 42m/sec and there was lots of calcium These were at the high end. F/U CTA also reviewed and indicated less than 80% disease and Dr Scot Dock . I spoke with him personally about the decidsion making process in this case There have been no TIA like symptoms. Duplex today was stable with bilateral 60-79% stenosis. F/U duplex then November 2014   She has smoked since age 26. She is not motivated to quit. I counseled her for less than 10 minutes She had previously tried patches 2 years agao with limited success and they were too expensive. Willing to try Welbutrin but not Chantix . Her BS has been reasonably controlled despite a poor diet.   Echo 5/12 normal EF 55% mild LVH  Duplex today with left sided velocity 444/111 slightly worse than previous  CXR 12/14  NAD    ROS: Denies fever, malais, weight loss, blurry vision, decreased visual acuity, cough, sputum, SOB, hemoptysis, pleuritic pain, palpitaitons, heartburn, abdominal pain, melena, lower extremity edema, claudication, or rash.  All other systems reviewed and negative  General: Affect appropriate Overweight white female  HEENT: normal Neck supple with no adenopathy JVP normal bilateral  bruits no thyromegaly Lungs clear with no wheezing and good diaphragmatic motion Heart:  S1/S2 no murmur, no rub, gallop or click PMI normal Abdomen: benighn, BS positve, no tenderness, no AAA no bruit.  No HSM or HJR Distal pulses intact  with no bruits Plus one bilateral edema Neuro non-focal Skin warm and dry No muscular weakness   Current Outpatient Prescriptions  Medication Sig Dispense Refill  . atorvastatin (LIPITOR) 80 MG tablet Take 1 tablet (80 mg total) by mouth daily.  90 tablet  3  . cilostazol (PLETAL) 100 MG tablet Take 1 tablet (100 mg total) by mouth 2 (two) times daily.  180 tablet  3  . clopidogrel (PLAVIX) 75 MG tablet TAKE 1 TABLET BY MOUTH DAILY  90 tablet  3  . glimepiride (AMARYL) 2 MG tablet TAKE 1 TABLET (2 MG TOTAL) BY MOUTH DAILY BEFORE BREAKFAST.  30 tablet  2  . glucose blood (ACCU-CHEK AVIVA) test strip Use as instructed  100 each  0  . linagliptin (TRADJENTA) 5 MG TABS tablet Take 1 tablet (5 mg total) by mouth daily.  90 tablet  3  . lisinopril-hydrochlorothiazide (PRINZIDE,ZESTORETIC) 20-12.5 MG per tablet Take 1 tablet by mouth 2 (two) times daily.  180 tablet  3  . metoprolol succinate (TOPROL-XL) 50 MG 24 hr tablet TAKE 1 AND 1/2 TABLETS BY MOUTH 2 (TWO) TIMES DAILY.  270 tablet  1  . nitroGLYCERIN (NITROSTAT) 0.4 MG SL tablet Place 1 tablet (0.4 mg total) under the tongue every 5 (five) minutes as needed.  25 tablet  11  . VOLTAREN 1 % GEL USE 4 GRAMS TO BOTH HANDS 4 TIMES A DAY  300 g  0   No current facility-administered medications for this visit.    Allergies  Review of patient's allergies indicates no known allergies.  Electrocardiogram:  SR rate 70 normal   Assessment and Plan

## 2014-01-03 ENCOUNTER — Ambulatory Visit: Payer: Medicare Other

## 2014-01-06 ENCOUNTER — Other Ambulatory Visit: Payer: Self-pay | Admitting: Nurse Practitioner

## 2014-01-15 ENCOUNTER — Other Ambulatory Visit: Payer: Self-pay | Admitting: Nurse Practitioner

## 2014-01-17 ENCOUNTER — Other Ambulatory Visit: Payer: Self-pay | Admitting: *Deleted

## 2014-01-17 MED ORDER — GLIMEPIRIDE 2 MG PO TABS: ORAL_TABLET | ORAL | Status: DC

## 2014-01-24 ENCOUNTER — Telehealth: Payer: Self-pay | Admitting: *Deleted

## 2014-01-24 NOTE — Telephone Encounter (Signed)
Spoke with pt's daughter to schedule appt for diabetes initiative

## 2014-02-20 ENCOUNTER — Other Ambulatory Visit: Payer: Self-pay | Admitting: *Deleted

## 2014-02-20 ENCOUNTER — Ambulatory Visit (INDEPENDENT_AMBULATORY_CARE_PROVIDER_SITE_OTHER): Payer: Medicare Other | Admitting: Family Medicine

## 2014-02-20 ENCOUNTER — Encounter: Payer: Self-pay | Admitting: Family Medicine

## 2014-02-20 VITALS — BP 137/64 | HR 67 | Temp 98.7°F | Ht 62.0 in | Wt 247.0 lb

## 2014-02-20 DIAGNOSIS — I2510 Atherosclerotic heart disease of native coronary artery without angina pectoris: Secondary | ICD-10-CM | POA: Diagnosis not present

## 2014-02-20 DIAGNOSIS — I10 Essential (primary) hypertension: Secondary | ICD-10-CM

## 2014-02-20 DIAGNOSIS — E119 Type 2 diabetes mellitus without complications: Secondary | ICD-10-CM | POA: Diagnosis not present

## 2014-02-20 MED ORDER — CILOSTAZOL 100 MG PO TABS: 100.0000 mg | ORAL_TABLET | Freq: Two times a day (BID) | ORAL | Status: DC

## 2014-02-20 MED ORDER — FUROSEMIDE 20 MG PO TABS: 20.0000 mg | ORAL_TABLET | ORAL | Status: DC | PRN

## 2014-02-20 NOTE — Progress Notes (Signed)
   Subjective:    Patient ID: [name] [name], female    DOB: [birth year], 78 y.o.   MRN: [id number]  HPI 78 year old female who is here as part of a diabetes initiative in looking back at her last visit in April her blood pressure was elevated at 169/73. Today her pressure is 137/64. Her last A1c was 6. There is a history of coronary artery disease having had a myocardial infarction in 1995 with placement of the stent. Since then she has been on both Pletal and Plavix. She does not use nitroglycerin but has it in her house.    Review of Systems  Constitutional: Negative.   HENT: Negative.   Respiratory: Negative.   Cardiovascular: Negative.   Gastrointestinal: Negative.   Endocrine: Negative.   Genitourinary: Negative.   Neurological: Negative.        Objective:   Physical Exam  Constitutional: She is oriented to person, place, and time. She appears well-developed and well-nourished.  Eyes: Conjunctivae and EOM are normal. Pupils are equal, round, and reactive to light.  Neck: Normal range of motion. Neck supple.  Cardiovascular: Normal rate, regular rhythm and normal heart sounds.   Pulmonary/Chest: Effort normal and breath sounds normal.  Abdominal: Soft. Bowel sounds are normal.  Musculoskeletal: Normal range of motion. Edema: 2+ at akles and feet.  Neurological: She is alert and oriented to person, place, and time. She has normal reflexes.  Skin: Skin is warm and dry.  Psychiatric: She has a normal mood and affect. Her behavior is normal. Thought content normal.          Assessment & Plan:  The primary encounter diagnosis was HYPERTENSION. A diagnosis of DM was also pertinent to this visit. Bp better controlled.  She does continue to smoke 1 pack/2 days  Wardell Honour MD

## 2014-02-20 NOTE — Patient Instructions (Signed)
Schedule eye exam

## 2014-02-20 NOTE — Addendum Note (Signed)
Addended by: Ilean China on: 02/20/2014 03:25 PM   Modules accepted: Orders

## 2014-04-16 ENCOUNTER — Other Ambulatory Visit: Payer: Self-pay | Admitting: Family Medicine

## 2014-05-03 ENCOUNTER — Other Ambulatory Visit: Payer: Self-pay | Admitting: Family Medicine

## 2014-05-06 NOTE — Telephone Encounter (Signed)
Last seen 02/20/14  Dr Sabra Heck last lipid 11/02/13

## 2014-05-08 ENCOUNTER — Other Ambulatory Visit: Payer: Self-pay | Admitting: Family

## 2014-05-09 ENCOUNTER — Telehealth: Payer: Self-pay | Admitting: Family

## 2014-05-10 ENCOUNTER — Other Ambulatory Visit: Payer: Self-pay | Admitting: *Deleted

## 2014-05-10 MED ORDER — GLIMEPIRIDE 2 MG PO TABS: ORAL_TABLET | ORAL | Status: DC

## 2014-05-10 NOTE — Telephone Encounter (Signed)
Called meds to  [facility] in [facility]

## 2014-05-18 ENCOUNTER — Other Ambulatory Visit: Payer: Self-pay | Admitting: Nurse Practitioner

## 2014-05-24 ENCOUNTER — Encounter: Payer: Self-pay | Admitting: Family Medicine

## 2014-05-24 ENCOUNTER — Ambulatory Visit (INDEPENDENT_AMBULATORY_CARE_PROVIDER_SITE_OTHER): Payer: Medicare Other | Admitting: Family Medicine

## 2014-05-24 VITALS — BP 94/68 | HR 78 | Temp 98.0°F | Ht 62.0 in | Wt 248.8 lb

## 2014-05-24 DIAGNOSIS — I2510 Atherosclerotic heart disease of native coronary artery without angina pectoris: Secondary | ICD-10-CM

## 2014-05-24 DIAGNOSIS — E119 Type 2 diabetes mellitus without complications: Secondary | ICD-10-CM | POA: Diagnosis not present

## 2014-05-24 DIAGNOSIS — I25119 Atherosclerotic heart disease of native coronary artery with unspecified angina pectoris: Secondary | ICD-10-CM

## 2014-05-24 DIAGNOSIS — I209 Angina pectoris, unspecified: Secondary | ICD-10-CM | POA: Diagnosis not present

## 2014-05-24 DIAGNOSIS — I10 Essential (primary) hypertension: Secondary | ICD-10-CM | POA: Diagnosis not present

## 2014-05-24 DIAGNOSIS — E785 Hyperlipidemia, unspecified: Secondary | ICD-10-CM

## 2014-05-24 DIAGNOSIS — Z23 Encounter for immunization: Secondary | ICD-10-CM | POA: Diagnosis not present

## 2014-05-24 DIAGNOSIS — Z720 Tobacco use: Secondary | ICD-10-CM | POA: Diagnosis not present

## 2014-05-24 DIAGNOSIS — F17200 Nicotine dependence, unspecified, uncomplicated: Secondary | ICD-10-CM

## 2014-05-24 LAB — POCT GLYCOSYLATED HEMOGLOBIN (HGB A1C)

## 2014-05-24 MED ORDER — LORAZEPAM 0.5 MG PO TABS: 0.5000 mg | ORAL_TABLET | Freq: Two times a day (BID) | ORAL | Status: DC | PRN

## 2014-05-24 NOTE — Progress Notes (Signed)
   Subjective:    Patient ID: [name] [name], female    DOB: 16-Jul-1936, 78 y.o.   MRN: [id number]  HPI 78 year old female here to follow-up her blood pressure, diabetes, and coronary artery disease. She denies any recent chest pain. She does a good job with her diabetes as evidenced by last A1c of 6.0. She does however today complaining of some nervousness and anxiety and insomnia. She cannot pinpoint any particular calls of anxiety    Review of Systems  Constitutional: Negative.   HENT: Negative.   Eyes: Negative.   Respiratory: Negative.   Cardiovascular: Negative.   Gastrointestinal: Negative.   Endocrine: Negative.   Genitourinary: Negative.   Musculoskeletal: Positive for myalgias.       Bilateral thumb pain  Skin: Rash: not true rash but healing intertrigo.  Hematological: Negative.   Psychiatric/Behavioral: Positive for sleep disturbance. The patient is nervous/anxious.        Objective:   Physical Exam  Constitutional: She is oriented to person, place, and time. She appears well-developed and well-nourished.  Eyes: Conjunctivae and EOM are normal.  Neck: Normal range of motion. Neck supple.  Cardiovascular: Normal rate, regular rhythm and normal heart sounds.   Pulmonary/Chest: Effort normal and breath sounds normal.  Abdominal: Soft. Bowel sounds are normal.  Musculoskeletal: Normal range of motion.  Neurological: She is alert and oriented to person, place, and time. She has normal reflexes.  Skin: Skin is warm and dry.  Psychiatric: She has a normal mood and affect. Her behavior is normal. Thought content normal.   BP 94/68  Pulse 78  Temp(Src) 98 F (36.7 C) (Oral)  Ht 5\' 2"  (1.575 m)  Wt 248 lb 12.8 oz (112.855 kg)  BMI 45.49 kg/m2       Assessment & Plan:  1. Diabetes type 2, controlled  - POCT glycosylated hemoglobin (Hb A1C)  2. Encounter for immunization   3. Essential hypertension **  4. Atherosclerosis of native coronary artery of native  heart with angina pectoris   5. SMOKER   6. Hyperlipidemia   Wardell Honour MD

## 2014-05-24 NOTE — Patient Instructions (Signed)

## 2014-05-26 ENCOUNTER — Other Ambulatory Visit: Payer: Self-pay | Admitting: Cardiovascular Disease

## 2014-05-31 ENCOUNTER — Other Ambulatory Visit (HOSPITAL_COMMUNITY): Payer: Self-pay | Admitting: *Deleted

## 2014-05-31 DIAGNOSIS — I6523 Occlusion and stenosis of bilateral carotid arteries: Secondary | ICD-10-CM

## 2014-06-16 ENCOUNTER — Other Ambulatory Visit: Payer: Self-pay | Admitting: Nurse Practitioner

## 2014-06-17 ENCOUNTER — Ambulatory Visit: Payer: Medicare Other | Admitting: Cardiovascular Disease

## 2014-06-17 ENCOUNTER — Telehealth: Payer: Self-pay | Admitting: Cardiovascular Disease

## 2014-06-17 ENCOUNTER — Encounter (HOSPITAL_COMMUNITY): Payer: Medicare Other

## 2014-06-17 NOTE — Telephone Encounter (Signed)
°  Called patient to schedule Carotid Doppler, she told me that she didn't want to schedule at this time and if she changed her mind she would call back.   Lourena Simmonds

## 2014-06-17 NOTE — Telephone Encounter (Signed)
Refilled per protocol, next visit in February, seen Oct 30

## 2014-06-18 NOTE — Telephone Encounter (Signed)
Spoke with PT    WOULD  LIKE  TO HOLD OFF ON TEST AND  APPT  TIL AFTER  THE HOLIDAYS PT  WILL CALL BACK AND  RESCHEDULE  CAROTID AND  F/U APPT./CY

## 2014-07-14 ENCOUNTER — Other Ambulatory Visit: Payer: Self-pay | Admitting: Nurse Practitioner

## 2014-07-18 ENCOUNTER — Telehealth: Payer: Self-pay | Admitting: *Deleted

## 2014-07-18 ENCOUNTER — Telehealth: Payer: Self-pay | Admitting: Family Medicine

## 2014-07-18 MED ORDER — GLUCOSE BLOOD VI STRP: ORAL_STRIP | Status: DC

## 2014-07-18 MED ORDER — ONETOUCH LANCETS MISC: Status: DC

## 2014-07-18 NOTE — Telephone Encounter (Signed)
These have been ordered and pt aware

## 2014-07-18 NOTE — Telephone Encounter (Signed)
done

## 2014-08-01 ENCOUNTER — Other Ambulatory Visit: Payer: Self-pay | Admitting: *Deleted

## 2014-08-01 MED ORDER — ATORVASTATIN CALCIUM 80 MG PO TABS: ORAL_TABLET | ORAL | Status: DC

## 2014-08-02 ENCOUNTER — Other Ambulatory Visit: Payer: Self-pay | Admitting: *Deleted

## 2014-08-02 DIAGNOSIS — I10 Essential (primary) hypertension: Secondary | ICD-10-CM

## 2014-08-02 MED ORDER — CILOSTAZOL 100 MG PO TABS: ORAL_TABLET | ORAL | Status: DC

## 2014-08-02 MED ORDER — FUROSEMIDE 20 MG PO TABS: ORAL_TABLET | ORAL | Status: DC

## 2014-08-02 MED ORDER — METOPROLOL SUCCINATE ER 50 MG PO TB24: ORAL_TABLET | ORAL | Status: DC

## 2014-08-02 MED ORDER — LISINOPRIL-HYDROCHLOROTHIAZIDE 20-12.5 MG PO TABS
1.0000 | ORAL_TABLET | Freq: Two times a day (BID) | ORAL | Status: DC
Start: 2014-08-02 — End: 2015-04-24

## 2014-08-02 MED ORDER — CLOPIDOGREL BISULFATE 75 MG PO TABS: 75.0000 mg | ORAL_TABLET | Freq: Every day | ORAL | Status: DC

## 2014-08-02 MED ORDER — ATORVASTATIN CALCIUM 80 MG PO TABS: ORAL_TABLET | ORAL | Status: DC

## 2014-08-02 MED ORDER — NITROGLYCERIN 0.4 MG SL SUBL: 0.4000 mg | SUBLINGUAL_TABLET | SUBLINGUAL | Status: DC | PRN

## 2014-08-05 ENCOUNTER — Other Ambulatory Visit: Payer: Self-pay | Admitting: *Deleted

## 2014-08-05 MED ORDER — GLIMEPIRIDE 2 MG PO TABS: ORAL_TABLET | ORAL | Status: DC

## 2014-08-08 ENCOUNTER — Ambulatory Visit: Payer: Medicare Other | Admitting: Cardiovascular Disease

## 2014-08-08 ENCOUNTER — Encounter (HOSPITAL_COMMUNITY): Payer: Medicare Other

## 2014-08-19 NOTE — Progress Notes (Signed)
Patient ID: [name] [name], female   DOB: [birth year], 79 y.o.   MRN: [id number] Noe returns today for followup. She has had previous stenting of the right coronary artery. She has had resting tachycardia. We started her on beta-blockers and she improved. We did not find a good reason forher tachycardia, although she is diabetic and may have some autonomic dysfunction. She has good LV function. She has bilateral carotid disease. I reviewed duplex from 5/15 Previously was concerned since her systolic velocities were greater than 81m/sec and there was lots of calcium These were at the high end. F/U CTA also reviewed and indicated less than 80% disease and Dr Scot Dock . I spoke with him personally about the decidsion making process in this case There have been no TIA like symptoms.   She has smoked since age 52. She is not motivated to quit. I counseled her for less than 10 minutes She had previously tried patches 2 years agao with limited success and they were too expensive. Willing to try Welbutrin but not Chantix . Her BS has been reasonably controlled despite a poor diet.   Echo 5/12 normal EF 55% mild LVH  Duplex 5/15  with left sided velocity 444/111 slightly worse than previous  CXR 12/14 NAD   Reviewed her duplex from today.  Not clear to me that she has continuous flow in left ICA and velocities about the same with systolic over 4.4 and diastolic over 1.1 Discussed need for f/u CTA and referral back to Dr Scot Dock for possible left CEA   ROS: Denies fever, malais, weight loss, blurry vision, decreased visual acuity, cough, sputum, SOB, hemoptysis, pleuritic pain, palpitaitons, heartburn, abdominal pain, melena, lower extremity edema, claudication, or rash.  All other systems reviewed and negative  General: Affect appropriate Obese white female  HEENT: normal Neck supple with no adenopathy JVP normalbilateral bruits no thyromegaly Lungs clear with no wheezing and good diaphragmatic  motion Heart:  S1/S2 no murmur, no rub, gallop or click PMI normal Abdomen: benighn, BS positve, no tenderness, no AAA no bruit.  No HSM or HJR Distal pulses intact with no bruits No edema Neuro non-focal Skin warm and dry No muscular weakness   Current Outpatient Prescriptions  Medication Sig Dispense Refill  . atorvastatin (LIPITOR) 80 MG tablet TAKE 1 TABLET (80 MG TOTAL) BY MOUTH DAILY. 90 tablet 1  . cilostazol (PLETAL) 100 MG tablet TAKE 1 TABLET (100 MG TOTAL) BY MOUTH 2 (TWO) TIMES DAILY. 180 tablet 1  . clopidogrel (PLAVIX) 75 MG tablet Take 1 tablet (75 mg total) by mouth daily. 90 tablet 1  . furosemide (LASIX) 20 MG tablet TAKE 1 TABLET (20 MG TOTAL) BY MOUTH AS NEEDED FOR FLUID OR EDEMA. 30 tablet 5  . glimepiride (AMARYL) 2 MG tablet TAKE 1 TABLET (2 MG TOTAL) BY MOUTH DAILY BEFORE BREAKFAST. 90 tablet 0  . glucose blood (ACCU-CHEK AVIVA) test strip Check BS qd. Dx E11.9 100 each 1  . lisinopril-hydrochlorothiazide (PRINZIDE,ZESTORETIC) 20-12.5 MG per tablet Take 1 tablet by mouth 2 (two) times daily. 180 tablet 1  . LORazepam (ATIVAN) 0.5 MG tablet Take 1 tablet (0.5 mg total) by mouth 2 (two) times daily as needed for anxiety. 30 tablet 1  . metoprolol succinate (TOPROL-XL) 50 MG 24 hr tablet TAKE 1 AND 1/2 TABLETS BY MOUTH 2 (TWO) TIMES DAILY. 270 tablet 1  . nitroGLYCERIN (NITROSTAT) 0.4 MG SL tablet Place 1 tablet (0.4 mg total) under the tongue every 5 (five) minutes as needed  for chest pain. 100 tablet 1  . ONE TOUCH LANCETS MISC Test BS BID and PRN. DX.E13.10 100 each 11  . TRADJENTA 5 MG TABS tablet TAKE 1 TABLET (5 MG TOTAL) BY MOUTH DAILY. 90 tablet 1  . VOLTAREN 1 % GEL USE 4 GRAMS TO BOTH HANDS 4 TIMES A DAY 300 g 0   No current facility-administered medications for this visit.    Allergies  Review of patient's allergies indicates no known allergies.  Electrocardiogram: 06/19/13  SR rate 70 normal ECG  08/20/14 Sr rate 81 normal  Assessment and Plan

## 2014-08-20 ENCOUNTER — Encounter: Payer: Self-pay | Admitting: Cardiovascular Disease

## 2014-08-20 ENCOUNTER — Ambulatory Visit (INDEPENDENT_AMBULATORY_CARE_PROVIDER_SITE_OTHER): Payer: Medicare Other | Admitting: Cardiovascular Disease

## 2014-08-20 ENCOUNTER — Ambulatory Visit (HOSPITAL_COMMUNITY): Payer: Medicare Other | Attending: Cardiology | Admitting: *Deleted

## 2014-08-20 VITALS — BP 136/70 | HR 81 | Ht 62.0 in | Wt 247.1 lb

## 2014-08-20 DIAGNOSIS — I6523 Occlusion and stenosis of bilateral carotid arteries: Secondary | ICD-10-CM | POA: Insufficient documentation

## 2014-08-20 DIAGNOSIS — Z01812 Encounter for preprocedural laboratory examination: Secondary | ICD-10-CM

## 2014-08-20 DIAGNOSIS — I6529 Occlusion and stenosis of unspecified carotid artery: Secondary | ICD-10-CM

## 2014-08-20 DIAGNOSIS — I10 Essential (primary) hypertension: Secondary | ICD-10-CM

## 2014-08-20 NOTE — Assessment & Plan Note (Signed)
Discussed low carb diet.  Target hemoglobin A1c is 6.5 or less.  Continue current medications.  

## 2014-08-20 NOTE — Patient Instructions (Addendum)
Your physician wants you to follow-up in:   [place] will receive a reminder letter in the mail two months in advance. If you don't receive a letter, please call our office to schedule the follow-up appointment.  You have been referred to DR CHRIS  DICKSON   CAROTID DISEASE AFTER  CTA OF  CAROTIDS   Your physician has recommended you make the following change in your medication: [facility] ON Monday   Your physician recommends that you return for lab work [id number]  BMET   CTA [place]

## 2014-08-20 NOTE — Progress Notes (Signed)
Carotid duplex exam performed.

## 2014-08-20 NOTE — Assessment & Plan Note (Signed)
Stable with no angina and good activity level.  Continue medical Rx  

## 2014-08-20 NOTE — Assessment & Plan Note (Addendum)
Still very concerned about her need for left CEA.  Doppler velocities remain very high with significant post stenotic drop off in pressure And now there are areas of non contiguous flow that may be shadowing or near total occlusion.  Will have CTA and refer back to Dr Scot Dock For consideration of CEA continue asa and plavix  Baseline Cr 1.68 will hold lasix for two days and check resume lasix after CTA

## 2014-08-20 NOTE — Assessment & Plan Note (Signed)
Well controlled.  Continue current medications and low sodium Dash type diet.    

## 2014-08-26 ENCOUNTER — Other Ambulatory Visit (INDEPENDENT_AMBULATORY_CARE_PROVIDER_SITE_OTHER): Payer: Medicare Other | Admitting: *Deleted

## 2014-08-26 DIAGNOSIS — Z01812 Encounter for preprocedural laboratory examination: Secondary | ICD-10-CM

## 2014-08-26 LAB — BASIC METABOLIC PANEL
BUN: 22 mg/dL (ref 6–23)
CHLORIDE: 108 meq/L (ref 96–112)
CO2: 23 mEq/L (ref 19–32)
CREATININE: 1.5 mg/dL — AB (ref 0.40–1.20)
Calcium: 8.7 mg/dL (ref 8.4–10.5)
GFR: 35.87 mL/min — ABNORMAL LOW (ref >60.00)
Glucose, Bld: 157 mg/dL — ABNORMAL HIGH (ref 70–99)
Potassium: 4.5 mEq/L (ref 3.5–5.1)
Sodium: 139 mEq/L (ref 135–145)

## 2014-08-27 ENCOUNTER — Ambulatory Visit (INDEPENDENT_AMBULATORY_CARE_PROVIDER_SITE_OTHER): Payer: Medicare Other | Admitting: Family Medicine

## 2014-08-27 ENCOUNTER — Encounter: Payer: Self-pay | Admitting: Family Medicine

## 2014-08-27 ENCOUNTER — Other Ambulatory Visit: Payer: Self-pay | Admitting: *Deleted

## 2014-08-27 VITALS — BP 148/68 | HR 78 | Temp 97.4°F | Ht 62.0 in | Wt 248.0 lb

## 2014-08-27 DIAGNOSIS — E1110 Type 2 diabetes mellitus with ketoacidosis without coma: Secondary | ICD-10-CM

## 2014-08-27 DIAGNOSIS — R7989 Other specified abnormal findings of blood chemistry: Secondary | ICD-10-CM

## 2014-08-27 DIAGNOSIS — E1310 Other specified diabetes mellitus with ketoacidosis without coma: Secondary | ICD-10-CM | POA: Diagnosis not present

## 2014-08-27 DIAGNOSIS — I10 Essential (primary) hypertension: Secondary | ICD-10-CM

## 2014-08-27 DIAGNOSIS — I6523 Occlusion and stenosis of bilateral carotid arteries: Secondary | ICD-10-CM | POA: Diagnosis not present

## 2014-08-27 DIAGNOSIS — N183 Chronic kidney disease, stage 3 unspecified: Secondary | ICD-10-CM

## 2014-08-27 DIAGNOSIS — R799 Abnormal finding of blood chemistry, unspecified: Secondary | ICD-10-CM

## 2014-08-27 DIAGNOSIS — E785 Hyperlipidemia, unspecified: Secondary | ICD-10-CM | POA: Diagnosis not present

## 2014-08-27 LAB — POCT UA - MICROALBUMIN: MICROALBUMIN (UR) POC: NEGATIVE mg/L

## 2014-08-27 LAB — POCT GLYCOSYLATED HEMOGLOBIN (HGB A1C): Hemoglobin A1C: 6.6

## 2014-08-27 MED ORDER — GLUCOSE BLOOD VI STRP: ORAL_STRIP | Status: DC

## 2014-08-27 MED ORDER — ONETOUCH DELICA LANCETS FINE MISC: 1.0000 | Freq: Two times a day (BID) | Status: DC

## 2014-08-27 NOTE — Progress Notes (Signed)
Subjective:    Patient ID: [name] [name], female    DOB: Dec 31, 1935, 79 y.o.   MRN: [id number]  HPI  79 year old woman comes in today accompanied by a family member to follow up on her chronic medical conditions which include DM, hypertension, and hyperlipidemia. She was seen yesterday by cardiologist who is following her carotid artery disease. Apparently he felt her degree of obstruction on the left and worsened and was sending her for a CT scan and follow-up by the surgeon. She is asymptomatic as far as transient ischemic attacks.  She continues to smoke. I note that the cardiologist offered her some Wellbutrin  At last visit I gave her some lorazepam. She takes it mostly at night and it does help to relax her and help sleep  Patient Active Problem List   Diagnosis Date Noted  . CKD (chronic kidney disease), stage III 11/02/2013  . SMOKER 05/30/2009  . Bilateral carotid bruits 05/30/2009  . DM (diabetes mellitus) type 2, uncontrolled, with ketoacidosis 05/29/2009  . Hyperlipidemia 05/29/2009  . OBESITY 05/29/2009  . Essential hypertension 05/29/2009  . Coronary atherosclerosis 05/29/2009  . PVD 05/29/2009  . VENOUS INSUFFICIENCY 05/29/2009  . Edema 05/29/2009   Outpatient Encounter Prescriptions as of 08/27/2014  Medication Sig  . atorvastatin (LIPITOR) 80 MG tablet TAKE 1 TABLET (80 MG TOTAL) BY MOUTH DAILY.  . cilostazol (PLETAL) 100 MG tablet TAKE 1 TABLET (100 MG TOTAL) BY MOUTH 2 (TWO) TIMES DAILY.  Marland Kitchen clopidogrel (PLAVIX) 75 MG tablet Take 1 tablet (75 mg total) by mouth daily.  . furosemide (LASIX) 20 MG tablet TAKE 1 TABLET (20 MG TOTAL) BY MOUTH AS NEEDED FOR FLUID OR EDEMA.  Marland Kitchen glimepiride (AMARYL) 2 MG tablet TAKE 1 TABLET (2 MG TOTAL) BY MOUTH DAILY BEFORE BREAKFAST.  Marland Kitchen glucose blood (ACCU-CHEK AVIVA) test strip Check BS qd. Dx E11.9  . lisinopril-hydrochlorothiazide (PRINZIDE,ZESTORETIC) 20-12.5 MG per tablet Take 1 tablet by mouth 2 (two) times daily.  Marland Kitchen LORazepam  (ATIVAN) 0.5 MG tablet Take 1 tablet (0.5 mg total) by mouth 2 (two) times daily as needed for anxiety.  . metoprolol succinate (TOPROL-XL) 50 MG 24 hr tablet TAKE 1 AND 1/2 TABLETS BY MOUTH 2 (TWO) TIMES DAILY.  . nitroGLYCERIN (NITROSTAT) 0.4 MG SL tablet Place 1 tablet (0.4 mg total) under the tongue every 5 (five) minutes as needed for chest pain.  . ONE TOUCH LANCETS MISC Test BS BID and PRN. DX.E13.10  . TRADJENTA 5 MG TABS tablet TAKE 1 TABLET (5 MG TOTAL) BY MOUTH DAILY.  . VOLTAREN 1 % GEL USE 4 GRAMS TO BOTH HANDS 4 TIMES A DAY     Review of Systems  Constitutional: Negative.   HENT: Negative.   Respiratory: Negative.   Cardiovascular: Negative.   Gastrointestinal: Negative.   Genitourinary: Negative.   Neurological: Negative.   Psychiatric/Behavioral: Negative.        Objective:   Physical Exam  Constitutional: She is oriented to person, place, and time. She appears well-developed.  HENT:  Head: Normocephalic.  Neck:  Bruit bilaterally  Cardiovascular: Normal rate.   Musculoskeletal: Normal range of motion.  Neurological: She is alert and oriented to person, place, and time.   BP 148/68 mmHg  Pulse 78  Temp(Src) 97.4 F (36.3 C) (Oral)  Ht 5\' 2"  (1.575 m)  Wt 248 lb (112.492 kg)  BMI 45.35 kg/m2        Assessment & Plan:  1. Essential hypertension  blood pressures are controlled  but there is significant discrepancy with right compared to left arm.    2. Hyperlipidemia  - Lipid panel - Hepatic function panel  3. DM (diabetes mellitus) type 2, uncontrolled, with ketoacidosis  - POCT glycosylated hemoglobin (Hb A1C) - POCT UA - Microalbumin  4. CKD (chronic kidney disease), stage III  Wardell Honour MD

## 2014-08-28 ENCOUNTER — Telehealth: Payer: Self-pay | Admitting: Cardiovascular Disease

## 2014-08-28 ENCOUNTER — Ambulatory Visit (INDEPENDENT_AMBULATORY_CARE_PROVIDER_SITE_OTHER)
Admission: RE | Admit: 2014-08-28 | Discharge: 2014-08-28 | Disposition: A | Discharge status: Home / Self Care | Payer: Medicare Other | Source: Ambulatory Visit | Attending: Cardiovascular Disease | Admitting: Cardiovascular Disease

## 2014-08-28 ENCOUNTER — Other Ambulatory Visit (INDEPENDENT_AMBULATORY_CARE_PROVIDER_SITE_OTHER): Payer: Medicare Other | Admitting: *Deleted

## 2014-08-28 DIAGNOSIS — I10 Essential (primary) hypertension: Secondary | ICD-10-CM

## 2014-08-28 DIAGNOSIS — R799 Abnormal finding of blood chemistry, unspecified: Secondary | ICD-10-CM | POA: Diagnosis not present

## 2014-08-28 DIAGNOSIS — I6529 Occlusion and stenosis of unspecified carotid artery: Secondary | ICD-10-CM

## 2014-08-28 DIAGNOSIS — N183 Chronic kidney disease, stage 3 unspecified: Secondary | ICD-10-CM

## 2014-08-28 DIAGNOSIS — I6523 Occlusion and stenosis of bilateral carotid arteries: Secondary | ICD-10-CM | POA: Diagnosis not present

## 2014-08-28 DIAGNOSIS — I2510 Atherosclerotic heart disease of native coronary artery without angina pectoris: Secondary | ICD-10-CM

## 2014-08-28 DIAGNOSIS — R7989 Other specified abnormal findings of blood chemistry: Secondary | ICD-10-CM

## 2014-08-28 LAB — HEPATIC FUNCTION PANEL
ALT: 10 IU/L (ref 0–32)
AST: 12 IU/L (ref 0–40)
Albumin: 4 g/dL (ref 3.5–4.8)
Alkaline Phosphatase: 131 IU/L — ABNORMAL HIGH (ref 39–117)
BILIRUBIN TOTAL: 0.6 mg/dL (ref 0.0–1.2)
Bilirubin, Direct: 0.18 mg/dL (ref 0.00–0.40)
Total Protein: 7 g/dL (ref 6.0–8.5)

## 2014-08-28 LAB — LIPID PANEL
Chol/HDL Ratio: 2.8 ratio units (ref 0.0–4.4)
Cholesterol, Total: 142 mg/dL (ref 100–199)
HDL: 50 mg/dL (ref >39)
LDL Calculated: 61 mg/dL (ref 0–99)
TRIGLYCERIDES: 157 mg/dL — AB (ref 0–149)
VLDL Cholesterol Cal: 31 mg/dL (ref 5–40)

## 2014-08-28 MED ORDER — IOHEXOL 350 MG/ML SOLN
70.0000 mL | Freq: Once | INTRAVENOUS | Status: AC | PRN
  Administered 2014-08-28: 70 mL via INTRAVENOUS

## 2014-08-28 NOTE — Telephone Encounter (Signed)
Pt calling to inform Dr Johnsie Cancel that he ordered for her to have repeat labs done on this Friday 08/30/14 at 1000 at [facility] due to transportation issues.  Pt states Dr Johnsie Cancel is to place lab orders in [facility] for them to draw the lab there and route the results back to him.  Informed the pt that Dr Johnsie Cancel is out of the office today, but I will route this message to him and his covering nurse to place wanted lab orders in for 2/5, and follow-up with the pt thereafter. Pt verbalized understanding and agrees with this plan.

## 2014-08-28 NOTE — Telephone Encounter (Signed)
New Msg       Pt daughter calling states Dr. Sabra Heck would like to receive a fax for lab orders.  Daughter trying to prevent bringing pt to [place].  Please call pt daughter at home or cell.

## 2014-08-28 NOTE — Telephone Encounter (Signed)
Informed the pt that per Dr Johnsie Cancel she can have her lab done at [facility] on 2/5 and bmet order is in the system for them to see.  Also informed the pt that per Dr Johnsie Cancel she should resume her previous meds post CTA.  Pt verbalized understanding and agrees with this plan.

## 2014-08-28 NOTE — Telephone Encounter (Signed)
All she needs is a f/u BMET can resume her previous meds post CTA

## 2014-08-30 ENCOUNTER — Other Ambulatory Visit (INDEPENDENT_AMBULATORY_CARE_PROVIDER_SITE_OTHER): Payer: Medicare Other

## 2014-08-30 DIAGNOSIS — N183 Chronic kidney disease, stage 3 unspecified: Secondary | ICD-10-CM

## 2014-08-30 DIAGNOSIS — I2510 Atherosclerotic heart disease of native coronary artery without angina pectoris: Secondary | ICD-10-CM | POA: Diagnosis not present

## 2014-08-30 DIAGNOSIS — I10 Essential (primary) hypertension: Secondary | ICD-10-CM

## 2014-08-30 NOTE — Progress Notes (Signed)
Lab work for Dr Jenkins Rouge

## 2014-08-31 LAB — BMP8+EGFR
BUN/Creatinine Ratio: 18 (ref 11–26)
BUN: 28 mg/dL — ABNORMAL HIGH (ref 8–27)
CALCIUM: 8.6 mg/dL — AB (ref 8.7–10.3)
CO2: 20 mmol/L (ref 18–29)
Chloride: 106 mmol/L (ref 97–108)
Creatinine, Ser: 1.58 mg/dL — ABNORMAL HIGH (ref 0.57–1.00)
GFR, EST AFRICAN AMERICAN: 36 mL/min/{1.73_m2} — AB (ref >59)
GFR, EST NON AFRICAN AMERICAN: 32 mL/min/{1.73_m2} — AB (ref >59)
Glucose: 155 mg/dL — ABNORMAL HIGH (ref 65–99)
Potassium: 4.2 mmol/L (ref 3.5–5.2)
SODIUM: 143 mmol/L (ref 134–144)

## 2014-09-04 ENCOUNTER — Telehealth: Payer: Self-pay | Admitting: *Deleted

## 2014-09-04 ENCOUNTER — Encounter: Payer: Self-pay | Admitting: Vascular Surgery

## 2014-09-04 NOTE — Telephone Encounter (Signed)
PT AWARE OF LAB RESULTS./CY 

## 2014-09-04 NOTE — Telephone Encounter (Signed)
-----   Message from Josue Hector, MD sent at 09/04/2014  7:36 AM EST ----- bmet stable for her   ----- Message -----    From: SYSTEM    Sent: 09/04/2014  12:04 AM      To: Josue Hector, MD

## 2014-09-05 ENCOUNTER — Encounter: Payer: Self-pay | Admitting: Vascular Surgery

## 2014-09-05 ENCOUNTER — Other Ambulatory Visit: Payer: Self-pay

## 2014-09-05 ENCOUNTER — Ambulatory Visit (INDEPENDENT_AMBULATORY_CARE_PROVIDER_SITE_OTHER): Payer: Medicare Other | Admitting: Vascular Surgery

## 2014-09-05 VITALS — BP 93/53 | HR 62 | Resp 18 | Ht 60.0 in | Wt 247.0 lb

## 2014-09-05 DIAGNOSIS — I6523 Occlusion and stenosis of bilateral carotid arteries: Secondary | ICD-10-CM

## 2014-09-05 NOTE — Progress Notes (Signed)
Vascular and Vein Specialist of [facility]  Patient name: [name] [name] MRN: [id number] DOB: 1935-12-05 Sex: female  REASON FOR VISIT: Follow up of carotid disease.  HPI: [name] [name] is a 79 y.o. female who I last saw on 08/02/2012. She has a history of bilateral carotid stenoses and is followed in the [place] office. Back then, she had an asymptomatic 60-79% left carotid stenosis with a 40-59% right carotid stenosis. I explained that I would not consider carotid endarterectomy was the stenosis progressed to greater than 80% or she developed new neurologic symptoms. At that time, we discussed the importance of tobacco cessation. She was on Plavix. Her hypertension and cholesterol were being managed by Dr. Johnsie Cancel.  Her carotid duplex scans are done in the [place] office and on her most recent study on 08/19/2014, she had a high-end 60-79% left carotid stenosis. Dr. Johnsie Cancel recommended a CTA and follow up in our office to be considered for possible left carotid endarterectomy.  The patient denies any history of stroke, TIAs, expressive or receptive aphasia, or amaurosis fugax.   Past Medical History  Diagnosis Date  . Bruit   . Hyperlipidemia   . HTN (hypertension)   . CAD   . PVD   . VENOUS INSUFFICIENCY   . DM   . OBESITY   . Edema   . PONV (postoperative nausea and vomiting)   . Angina 1995  . Carotid artery occlusion   . Myocardial infarction 1995   Family History  Problem Relation Age of Onset  . Anesthesia problems Neg Hx   . Hypotension Neg Hx   . Malignant hyperthermia Neg Hx   . Pseudochol deficiency Neg Hx    SOCIAL HISTORY: History  Substance Use Topics  . Smoking status: Current Every Day Smoker -- 0.50 packs/day for 53 years    Types: Cigarettes  . Smokeless tobacco: Never Used     Comment: smoker since age 89  . Alcohol Use: No   No Known Allergies Current Outpatient Prescriptions  Medication Sig Dispense Refill  . atorvastatin (LIPITOR) 80 MG tablet TAKE  1 TABLET (80 MG TOTAL) BY MOUTH DAILY. 90 tablet 1  . cilostazol (PLETAL) 100 MG tablet TAKE 1 TABLET (100 MG TOTAL) BY MOUTH 2 (TWO) TIMES DAILY. 180 tablet 1  . clopidogrel (PLAVIX) 75 MG tablet Take 1 tablet (75 mg total) by mouth daily. 90 tablet 1  . furosemide (LASIX) 20 MG tablet TAKE 1 TABLET (20 MG TOTAL) BY MOUTH AS NEEDED FOR FLUID OR EDEMA. 30 tablet 5  . glimepiride (AMARYL) 2 MG tablet TAKE 1 TABLET (2 MG TOTAL) BY MOUTH DAILY BEFORE BREAKFAST. 90 tablet 0  . glucose blood test strip Use BID and PRN 100 each 5  . lisinopril-hydrochlorothiazide (PRINZIDE,ZESTORETIC) 20-12.5 MG per tablet Take 1 tablet by mouth 2 (two) times daily. 180 tablet 1  . LORazepam (ATIVAN) 0.5 MG tablet Take 1 tablet (0.5 mg total) by mouth 2 (two) times daily as needed for anxiety. 30 tablet 1  . metoprolol succinate (TOPROL-XL) 50 MG 24 hr tablet TAKE 1 AND 1/2 TABLETS BY MOUTH 2 (TWO) TIMES DAILY. 270 tablet 1  . nitroGLYCERIN (NITROSTAT) 0.4 MG SL tablet Place 1 tablet (0.4 mg total) under the tongue every 5 (five) minutes as needed for chest pain. 100 tablet 1  . ONETOUCH DELICA LANCETS FINE MISC 1 each by Does not apply route 2 (two) times daily. 100 each 5  . TRADJENTA 5 MG TABS tablet TAKE 1 TABLET (  5 MG TOTAL) BY MOUTH DAILY. 90 tablet 1  . VOLTAREN 1 % GEL USE 4 GRAMS TO BOTH HANDS 4 TIMES A DAY 300 g 0   No current facility-administered medications for this visit.   REVIEW OF SYSTEMS: Valu.Nieves ] denotes positive finding; [  ] denotes negative finding  CARDIOVASCULAR:  [ ]  chest pain   [ ]  chest pressure   [ ]  palpitations   [ ]  orthopnea   [ ]  dyspnea on exertion   [ ]  claudication   [ ]  rest pain   [ ]  DVT   [ ]  phlebitis PULMONARY:   [ ]  productive cough   [ ]  asthma   [ ]  wheezing NEUROLOGIC:   [ ]  weakness  [ ]  paresthesias  [ ]  aphasia  [ ]  amaurosis  [ ]  dizziness HEMATOLOGIC:   [ ]  bleeding problems   [ ]  clotting disorders MUSCULOSKELETAL:  [ ]  joint pain   [ ]  joint swelling [ ]  leg  swelling GASTROINTESTINAL: [ ]   blood in stool  [ ]   hematemesis GENITOURINARY:  [ ]   dysuria  [ ]   hematuria PSYCHIATRIC:  [ ]  history of major depression INTEGUMENTARY:  [ ]  rashes  [ ]  ulcers CONSTITUTIONAL:  [ ]  fever   [ ]  chills  PHYSICAL EXAM: Filed Vitals:   09/05/14 1347 09/05/14 1348  BP: 139/56 93/53  Pulse: 77 62  Resp: 18   Height: 5' (1.524 m)   Weight: 247 lb (112.038 kg)    Body mass index is 48.24 kg/(m^2). GENERAL: The patient is a well-nourished female, in no acute distress. The vital signs are documented above. CARDIOVASCULAR: There is a regular rate and rhythm. She has soft bilateral carotid bruits. PULMONARY: There is good air exchange bilaterally without wheezing or rales. ABDOMEN: Soft and non-tender with normal pitched bowel sounds.  MUSCULOSKELETAL: There are no major deformities or cyanosis. NEUROLOGIC: No focal weakness or paresthesias are detected. SKIN: There are no ulcers or rashes noted. PSYCHIATRIC: The patient has a normal affect.  DATA:  CTA on 08/28/2014 showed a 40% proximal right internal carotid artery stenosis with a heavily calcified left carotid stenosis. It was interpreted as possibly showing a greater than 75% stenosis on the left although it was difficult to determine because of her calcific disease.  I have reviewed the duplex scan from the [facility] vascular lab. There is a greater than 80% left carotid stenosis. Peak systolic velocity is 496 cm/s with an end-diastolic velocity of 759 cm/s. ICA to CCA ratio is 4.4. This stenosis is in the proximal internal carotid artery. There is a 60-79% right carotid stenosis in the lower end of that range. The patient does have retrograde flow in the right vertebral artery and the right brachial pressure is lower.  MEDICAL ISSUES:  ASYMPTOMATIC GREATER THAN 80% LEFT CAROTID STENOSIS: given that the stenosis on the left is progressed to greater than 80%, I have recommended left carotid endarterectomy. I  have reviewed the indications for carotid endarterectomy, that is to lower the risk of future stroke. I have also reviewed the potential complications of surgery, including but not limited to: bleeding, stroke (perioperative risk 1-2%), MI, nerve injury of other unpredictable medical problems. All of the patients questions were answered and they are agreeable to proceed with surgery. I would like to stop her Plavix 5 days preoperatively and have her take aspirin perioperatively. Postoperatively we can get her back on Plavix and stop her aspirin. Her surgery is scheduled  for 09/12/2014.   She has been evaluated by Dr. Jenkins Rouge and cleared from a cardiac standpoint for surgery.  [place] [facility] [place] Beeper: [phone]

## 2014-09-06 ENCOUNTER — Encounter (HOSPITAL_COMMUNITY): Payer: Self-pay | Admitting: Pharmacy Technician

## 2014-09-11 ENCOUNTER — Encounter (HOSPITAL_COMMUNITY): Payer: Self-pay

## 2014-09-11 ENCOUNTER — Encounter (HOSPITAL_COMMUNITY)
Admission: RE | Admit: 2014-09-11 | Discharge: 2014-09-11 | Disposition: A | Discharge status: Home / Self Care | Payer: Medicare Other | Source: Ambulatory Visit | Attending: Vascular Surgery | Admitting: Vascular Surgery

## 2014-09-11 HISTORY — DX: Anxiety disorder, unspecified: F41.9

## 2014-09-11 HISTORY — DX: Reserved for inherently not codable concepts without codable children: IMO0001

## 2014-09-11 HISTORY — DX: Major depressive disorder, single episode, unspecified: F32.9

## 2014-09-11 HISTORY — DX: Cough: R05

## 2014-09-11 HISTORY — DX: Depression, unspecified: F32.A

## 2014-09-11 HISTORY — DX: Cough, unspecified: R05.9

## 2014-09-11 LAB — COMPREHENSIVE METABOLIC PANEL
ALBUMIN: 3.4 g/dL — AB (ref 3.5–5.2)
ALT: 16 U/L (ref 0–35)
AST: 24 U/L (ref 0–37)
Alkaline Phosphatase: 119 U/L — ABNORMAL HIGH (ref 39–117)
Anion gap: 13 (ref 5–15)
BILIRUBIN TOTAL: 0.7 mg/dL (ref 0.3–1.2)
BUN: 20 mg/dL (ref 6–23)
CHLORIDE: 108 mmol/L (ref 96–112)
CO2: 18 mmol/L — ABNORMAL LOW (ref 19–32)
Calcium: 8.6 mg/dL (ref 8.4–10.5)
Creatinine, Ser: 1.73 mg/dL — ABNORMAL HIGH (ref 0.50–1.10)
GFR calc Af Amer: 32 mL/min — ABNORMAL LOW (ref >90)
GFR calc non Af Amer: 27 mL/min — ABNORMAL LOW (ref >90)
Glucose, Bld: 138 mg/dL — ABNORMAL HIGH (ref 70–99)
Potassium: 4.7 mmol/L (ref 3.5–5.1)
SODIUM: 139 mmol/L (ref 135–145)
Total Protein: 6.9 g/dL (ref 6.0–8.3)

## 2014-09-11 LAB — URINALYSIS, ROUTINE W REFLEX MICROSCOPIC
BILIRUBIN URINE: NEGATIVE
GLUCOSE, UA: NEGATIVE mg/dL
HGB URINE DIPSTICK: NEGATIVE
KETONES UR: NEGATIVE mg/dL
Leukocytes, UA: NEGATIVE
Nitrite: NEGATIVE
PH: 6 (ref 5.0–8.0)
Protein, ur: NEGATIVE mg/dL
Specific Gravity, Urine: 1.01 (ref 1.005–1.030)
Urobilinogen, UA: 0.2 mg/dL (ref 0.0–1.0)

## 2014-09-11 LAB — PROTIME-INR
INR: 0.96 (ref 0.00–1.49)
PROTHROMBIN TIME: 12.9 s (ref 11.6–15.2)

## 2014-09-11 LAB — TYPE AND SCREEN
ABO/RH(D): A POS
Antibody Screen: NEGATIVE

## 2014-09-11 LAB — APTT: APTT: 29 s (ref 24–37)

## 2014-09-11 LAB — CBC
HCT: 39.9 % (ref 36.0–46.0)
Hemoglobin: 12.4 g/dL (ref 12.0–15.0)
MCH: 31.1 pg (ref 26.0–34.0)
MCHC: 31.1 g/dL (ref 30.0–36.0)
MCV: 100 fL (ref 78.0–100.0)
PLATELETS: 120 10*3/uL — AB (ref 150–400)
RBC: 3.99 MIL/uL (ref 3.87–5.11)
RDW: 15 % (ref 11.5–15.5)
WBC: 9.3 10*3/uL (ref 4.0–10.5)

## 2014-09-11 LAB — ABO/RH: ABO/RH(D): A POS

## 2014-09-11 LAB — SURGICAL PCR SCREEN
MRSA, PCR: NEGATIVE
Staphylococcus aureus: POSITIVE — AB

## 2014-09-11 MED ORDER — SODIUM CHLORIDE 0.9 % IV SOLN: INTRAVENOUS | Status: DC

## 2014-09-11 MED ORDER — DEXTROSE 5 % IV SOLN
1.5000 g | INTRAVENOUS | Status: AC
  Administered 2014-09-12: 1.5 g via INTRAVENOUS
  Filled 2014-09-11: qty 1.5

## 2014-09-11 MED ORDER — CHLORHEXIDINE GLUCONATE CLOTH 2 % EX PADS: 6.0000 | MEDICATED_PAD | Freq: Once | CUTANEOUS | Status: DC

## 2014-09-11 NOTE — Pre-Procedure Instructions (Signed)
[name] [name]  09/11/2014   Your procedure is scheduled on:  09/12/14  Report to [facility] Admitting at 530 AM.  Call this number if you have problems the morning of surgery: [phone]   Remember:   Do not eat food or drink liquids after midnight.   Take these medicines the morning of surgery with A SIP OF WATER: ativan,metroprolol,pletal   Do not wear jewelry, make-up or nail polish.  Do not wear lotions, powders, or perfumes. You may wear deodorant.  Do not shave 48 hours prior to surgery. Men may shave face and neck.  Do not bring valuables to the hospital.  [facility] is not responsible                  for any belongings or valuables.               Contacts, dentures or bridgework may not be worn into surgery.  Leave suitcase in the car. After surgery it may be brought to your room.  For patients admitted to the hospital, discharge time is determined by your                treatment team.               Patients discharged the day of surgery will not be allowed to drive  home.  Name and phone number of your driver: family  Special Instructions: Shower using CHG 2 nights before surgery and the night before surgery.  If you shower the day of surgery use CHG.  Use special wash - you have one bottle of CHG for all showers.  You should use approximately 1/3 of the bottle for each shower.   Please read over the following fact sheets that you were given: Pain Booklet, Coughing and Deep Breathing, Blood Transfusion Information, MRSA Information and Surgical Site Infection Prevention

## 2014-09-12 ENCOUNTER — Inpatient Hospital Stay (HOSPITAL_COMMUNITY): Payer: Medicare Other | Admitting: Vascular Surgery

## 2014-09-12 ENCOUNTER — Inpatient Hospital Stay (HOSPITAL_COMMUNITY): Payer: Medicare Other | Admitting: Certified Registered Nurse Anesthetist

## 2014-09-12 ENCOUNTER — Inpatient Hospital Stay (HOSPITAL_COMMUNITY)
Admission: RE | Admit: 2014-09-12 | Discharge: 2014-09-14 | DRG: 037 | Disposition: A | Discharge status: Home / Self Care | Payer: Medicare Other | Source: Ambulatory Visit | Attending: Vascular Surgery | Admitting: Vascular Surgery

## 2014-09-12 ENCOUNTER — Telehealth: Payer: Self-pay | Admitting: Vascular Surgery

## 2014-09-12 ENCOUNTER — Encounter (HOSPITAL_COMMUNITY)
Admission: RE | Disposition: A | Discharge status: Home / Self Care | Payer: Self-pay | Source: Ambulatory Visit | Attending: Vascular Surgery

## 2014-09-12 ENCOUNTER — Encounter (HOSPITAL_COMMUNITY): Payer: Self-pay | Admitting: Certified Registered Nurse Anesthetist

## 2014-09-12 DIAGNOSIS — F329 Major depressive disorder, single episode, unspecified: Secondary | ICD-10-CM | POA: Diagnosis present

## 2014-09-12 DIAGNOSIS — G4733 Obstructive sleep apnea (adult) (pediatric): Secondary | ICD-10-CM | POA: Diagnosis not present

## 2014-09-12 DIAGNOSIS — K219 Gastro-esophageal reflux disease without esophagitis: Secondary | ICD-10-CM | POA: Diagnosis present

## 2014-09-12 DIAGNOSIS — J9811 Atelectasis: Secondary | ICD-10-CM | POA: Diagnosis not present

## 2014-09-12 DIAGNOSIS — E119 Type 2 diabetes mellitus without complications: Secondary | ICD-10-CM | POA: Diagnosis present

## 2014-09-12 DIAGNOSIS — E662 Morbid (severe) obesity with alveolar hypoventilation: Secondary | ICD-10-CM | POA: Diagnosis present

## 2014-09-12 DIAGNOSIS — J9601 Acute respiratory failure with hypoxia: Secondary | ICD-10-CM

## 2014-09-12 DIAGNOSIS — I6523 Occlusion and stenosis of bilateral carotid arteries: Secondary | ICD-10-CM | POA: Diagnosis not present

## 2014-09-12 DIAGNOSIS — Z7902 Long term (current) use of antithrombotics/antiplatelets: Secondary | ICD-10-CM

## 2014-09-12 DIAGNOSIS — I739 Peripheral vascular disease, unspecified: Secondary | ICD-10-CM | POA: Diagnosis present

## 2014-09-12 DIAGNOSIS — R0902 Hypoxemia: Secondary | ICD-10-CM

## 2014-09-12 DIAGNOSIS — J9621 Acute and chronic respiratory failure with hypoxia: Secondary | ICD-10-CM | POA: Diagnosis not present

## 2014-09-12 DIAGNOSIS — I252 Old myocardial infarction: Secondary | ICD-10-CM

## 2014-09-12 DIAGNOSIS — E785 Hyperlipidemia, unspecified: Secondary | ICD-10-CM | POA: Diagnosis present

## 2014-09-12 DIAGNOSIS — I10 Essential (primary) hypertension: Secondary | ICD-10-CM | POA: Diagnosis present

## 2014-09-12 DIAGNOSIS — F17210 Nicotine dependence, cigarettes, uncomplicated: Secondary | ICD-10-CM | POA: Diagnosis present

## 2014-09-12 DIAGNOSIS — I2510 Atherosclerotic heart disease of native coronary artery without angina pectoris: Secondary | ICD-10-CM | POA: Diagnosis present

## 2014-09-12 DIAGNOSIS — Z955 Presence of coronary angioplasty implant and graft: Secondary | ICD-10-CM | POA: Diagnosis not present

## 2014-09-12 DIAGNOSIS — F419 Anxiety disorder, unspecified: Secondary | ICD-10-CM | POA: Diagnosis present

## 2014-09-12 DIAGNOSIS — I872 Venous insufficiency (chronic) (peripheral): Secondary | ICD-10-CM | POA: Diagnosis present

## 2014-09-12 DIAGNOSIS — Z6841 Body Mass Index (BMI) 40.0 and over, adult: Secondary | ICD-10-CM | POA: Diagnosis not present

## 2014-09-12 DIAGNOSIS — Z9981 Dependence on supplemental oxygen: Secondary | ICD-10-CM

## 2014-09-12 DIAGNOSIS — I6522 Occlusion and stenosis of left carotid artery: Secondary | ICD-10-CM | POA: Diagnosis not present

## 2014-09-12 HISTORY — PX: PATCH ANGIOPLASTY: SHX6230

## 2014-09-12 HISTORY — PX: ENDARTERECTOMY: SHX5162

## 2014-09-12 LAB — GLUCOSE, CAPILLARY
GLUCOSE-CAPILLARY: 175 mg/dL — AB (ref 70–99)
Glucose-Capillary: 151 mg/dL — ABNORMAL HIGH (ref 70–99)
Glucose-Capillary: 116 mg/dL — ABNORMAL HIGH (ref 70–99)

## 2014-09-12 SURGERY — ENDARTERECTOMY, CAROTID
Anesthesia: General | Site: Neck | Laterality: Left

## 2014-09-12 MED ORDER — MAGNESIUM SULFATE 2 GM/50ML IV SOLN: 2.0000 g | Freq: Every day | INTRAVENOUS | Status: DC | PRN

## 2014-09-12 MED ORDER — DOPAMINE-DEXTROSE 3.2-5 MG/ML-% IV SOLN: 0.0000 ug/kg/min | INTRAVENOUS | Status: DC

## 2014-09-12 MED ORDER — DEXTRAN 40 IN SALINE 10-0.9 % IV SOLN
INTRAVENOUS | Status: AC
  Filled 2014-09-12: qty 500

## 2014-09-12 MED ORDER — SODIUM CHLORIDE 0.9 % IJ SOLN
INTRAMUSCULAR | Status: AC
  Filled 2014-09-12: qty 10

## 2014-09-12 MED ORDER — FENTANYL CITRATE 0.05 MG/ML IJ SOLN
INTRAMUSCULAR | Status: AC
  Administered 2014-09-12: 25 ug via INTRAVENOUS
  Filled 2014-09-12: qty 2

## 2014-09-12 MED ORDER — PROPOFOL 10 MG/ML IV BOLUS
INTRAVENOUS | Status: DC | PRN
  Administered 2014-09-12: 20 mg via INTRAVENOUS
  Administered 2014-09-12: 140 mg via INTRAVENOUS
  Administered 2014-09-12: 30 mg via INTRAVENOUS

## 2014-09-12 MED ORDER — LABETALOL HCL 5 MG/ML IV SOLN
INTRAVENOUS | Status: DC | PRN
  Administered 2014-09-12: 7.5 mg via INTRAVENOUS

## 2014-09-12 MED ORDER — METOPROLOL TARTRATE 50 MG PO TABS
75.0000 mg | ORAL_TABLET | Freq: Once | ORAL | Status: AC
  Administered 2014-09-12: 75 mg via ORAL
  Filled 2014-09-12: qty 2
  Filled 2014-09-12 (×2): qty 1

## 2014-09-12 MED ORDER — LIDOCAINE HCL (PF) 1 % IJ SOLN
INTRAMUSCULAR | Status: AC
  Filled 2014-09-12: qty 30

## 2014-09-12 MED ORDER — FENTANYL CITRATE 0.05 MG/ML IJ SOLN
INTRAMUSCULAR | Status: AC
  Filled 2014-09-12: qty 5

## 2014-09-12 MED ORDER — DOCUSATE SODIUM 100 MG PO CAPS
100.0000 mg | ORAL_CAPSULE | Freq: Every day | ORAL | Status: DC
  Administered 2014-09-13 – 2014-09-14 (×2): 100 mg via ORAL
  Filled 2014-09-12 (×2): qty 1

## 2014-09-12 MED ORDER — HYDRALAZINE HCL 20 MG/ML IJ SOLN
5.0000 mg | INTRAMUSCULAR | Status: DC | PRN
Start: 2014-09-12 — End: 2014-09-14

## 2014-09-12 MED ORDER — OXYCODONE HCL 5 MG/5ML PO SOLN: 5.0000 mg | Freq: Once | ORAL | Status: DC | PRN

## 2014-09-12 MED ORDER — NEOSTIGMINE METHYLSULFATE 10 MG/10ML IV SOLN
INTRAVENOUS | Status: DC | PRN
  Administered 2014-09-12: 5 mg via INTRAVENOUS

## 2014-09-12 MED ORDER — MIDAZOLAM HCL 2 MG/2ML IJ SOLN
INTRAMUSCULAR | Status: AC
  Filled 2014-09-12: qty 2

## 2014-09-12 MED ORDER — ALUM & MAG HYDROXIDE-SIMETH 200-200-20 MG/5ML PO SUSP: 15.0000 mL | ORAL | Status: DC | PRN

## 2014-09-12 MED ORDER — SODIUM CHLORIDE 0.9 % IV SOLN
500.0000 mL | Freq: Once | INTRAVENOUS | Status: AC | PRN
  Administered 2014-09-12: 500 mL via INTRAVENOUS

## 2014-09-12 MED ORDER — OXYCODONE HCL 5 MG PO TABS: 5.0000 mg | ORAL_TABLET | Freq: Once | ORAL | Status: DC | PRN

## 2014-09-12 MED ORDER — GLYCOPYRROLATE 0.2 MG/ML IJ SOLN
INTRAMUSCULAR | Status: AC
  Filled 2014-09-12: qty 4

## 2014-09-12 MED ORDER — ACETAMINOPHEN 325 MG RE SUPP
325.0000 mg | RECTAL | Status: DC | PRN
  Filled 2014-09-12: qty 2

## 2014-09-12 MED ORDER — PROPOFOL 10 MG/ML IV BOLUS
INTRAVENOUS | Status: AC
  Filled 2014-09-12: qty 20

## 2014-09-12 MED ORDER — EPHEDRINE SULFATE 50 MG/ML IJ SOLN
INTRAMUSCULAR | Status: DC | PRN
  Administered 2014-09-12: 15 mg via INTRAVENOUS
  Administered 2014-09-12: 10 mg via INTRAVENOUS
  Administered 2014-09-12: 15 mg via INTRAVENOUS
  Administered 2014-09-12 (×3): 10 mg via INTRAVENOUS

## 2014-09-12 MED ORDER — MUPIROCIN 2 % EX OINT
TOPICAL_OINTMENT | Freq: Two times a day (BID) | CUTANEOUS | Status: DC
  Administered 2014-09-12 (×2): 1 via NASAL
  Administered 2014-09-13 – 2014-09-14 (×3): via NASAL
  Filled 2014-09-12 (×3): qty 22

## 2014-09-12 MED ORDER — NEOSTIGMINE METHYLSULFATE 10 MG/10ML IV SOLN
INTRAVENOUS | Status: AC
  Filled 2014-09-12: qty 1

## 2014-09-12 MED ORDER — LIDOCAINE HCL (PF) 1 % IJ SOLN: INTRAMUSCULAR | Status: DC | PRN

## 2014-09-12 MED ORDER — INSULIN ASPART 100 UNIT/ML ~~LOC~~ SOLN
0.0000 [IU] | Freq: Three times a day (TID) | SUBCUTANEOUS | Status: DC
  Administered 2014-09-13 (×2): 2 [IU] via SUBCUTANEOUS
  Administered 2014-09-13: 4 [IU] via SUBCUTANEOUS
  Administered 2014-09-14: 7 [IU] via SUBCUTANEOUS
  Administered 2014-09-14: 3 [IU] via SUBCUTANEOUS

## 2014-09-12 MED ORDER — PHENYLEPHRINE HCL 10 MG/ML IJ SOLN
INTRAMUSCULAR | Status: DC | PRN
  Administered 2014-09-12 (×2): 120 ug via INTRAVENOUS
  Administered 2014-09-12: 200 ug via INTRAVENOUS
  Administered 2014-09-12 (×4): 120 ug via INTRAVENOUS

## 2014-09-12 MED ORDER — SODIUM CHLORIDE 0.9 % IR SOLN
Status: DC | PRN
  Administered 2014-09-12: 500 mL

## 2014-09-12 MED ORDER — CILOSTAZOL 100 MG PO TABS
100.0000 mg | ORAL_TABLET | Freq: Two times a day (BID) | ORAL | Status: DC
  Administered 2014-09-12 – 2014-09-14 (×4): 100 mg via ORAL
  Filled 2014-09-12 (×5): qty 1

## 2014-09-12 MED ORDER — OXYCODONE HCL 5 MG PO TABS
5.0000 mg | ORAL_TABLET | ORAL | Status: DC | PRN
  Administered 2014-09-12 – 2014-09-13 (×3): 5 mg via ORAL
  Filled 2014-09-12 (×4): qty 1

## 2014-09-12 MED ORDER — LIDOCAINE HCL (CARDIAC) 20 MG/ML IV SOLN
INTRAVENOUS | Status: DC | PRN
  Administered 2014-09-12: 50 mg via INTRAVENOUS

## 2014-09-12 MED ORDER — PROTAMINE SULFATE 10 MG/ML IV SOLN
INTRAVENOUS | Status: AC
  Filled 2014-09-12: qty 5

## 2014-09-12 MED ORDER — GLIMEPIRIDE 2 MG PO TABS
2.0000 mg | ORAL_TABLET | Freq: Every day | ORAL | Status: DC
  Administered 2014-09-13 – 2014-09-14 (×2): 2 mg via ORAL
  Filled 2014-09-12 (×3): qty 1

## 2014-09-12 MED ORDER — DEXTRAN 40 IN SALINE 10-0.9 % IV SOLN
INTRAVENOUS | Status: DC | PRN
  Administered 2014-09-12: 500 mL

## 2014-09-12 MED ORDER — ONDANSETRON HCL 4 MG/2ML IJ SOLN: 4.0000 mg | Freq: Once | INTRAMUSCULAR | Status: DC | PRN

## 2014-09-12 MED ORDER — SODIUM CHLORIDE 0.9 % IV BOLUS (SEPSIS)
500.0000 mL | Freq: Once | INTRAVENOUS | Status: AC
  Administered 2014-09-12: 500 mL via INTRAVENOUS

## 2014-09-12 MED ORDER — PHENYLEPHRINE 40 MCG/ML (10ML) SYRINGE FOR IV PUSH (FOR BLOOD PRESSURE SUPPORT)
PREFILLED_SYRINGE | INTRAVENOUS | Status: AC
  Filled 2014-09-12: qty 30

## 2014-09-12 MED ORDER — CLOPIDOGREL BISULFATE 75 MG PO TABS
75.0000 mg | ORAL_TABLET | Freq: Every day | ORAL | Status: DC
  Administered 2014-09-13 – 2014-09-14 (×2): 75 mg via ORAL
  Filled 2014-09-12 (×2): qty 1

## 2014-09-12 MED ORDER — NITROGLYCERIN 0.4 MG SL SUBL: 0.4000 mg | SUBLINGUAL_TABLET | SUBLINGUAL | Status: DC | PRN

## 2014-09-12 MED ORDER — HYDROCHLOROTHIAZIDE 12.5 MG PO CAPS
12.5000 mg | ORAL_CAPSULE | Freq: Two times a day (BID) | ORAL | Status: DC
  Administered 2014-09-13 – 2014-09-14 (×2): 12.5 mg via ORAL
  Filled 2014-09-12 (×5): qty 1

## 2014-09-12 MED ORDER — GUAIFENESIN-DM 100-10 MG/5ML PO SYRP: 15.0000 mL | ORAL_SOLUTION | ORAL | Status: DC | PRN

## 2014-09-12 MED ORDER — LACTATED RINGERS IV SOLN
INTRAVENOUS | Status: DC | PRN
  Administered 2014-09-12 (×2): via INTRAVENOUS

## 2014-09-12 MED ORDER — EPINEPHRINE HCL 0.1 MG/ML IJ SOSY
PREFILLED_SYRINGE | INTRAMUSCULAR | Status: AC
  Filled 2014-09-12: qty 10

## 2014-09-12 MED ORDER — POTASSIUM CHLORIDE CRYS ER 20 MEQ PO TBCR
20.0000 meq | EXTENDED_RELEASE_TABLET | Freq: Every day | ORAL | Status: DC | PRN
Start: 2014-09-12 — End: 2014-09-14

## 2014-09-12 MED ORDER — PANTOPRAZOLE SODIUM 40 MG PO TBEC
40.0000 mg | DELAYED_RELEASE_TABLET | Freq: Every day | ORAL | Status: DC
  Administered 2014-09-12 – 2014-09-14 (×3): 40 mg via ORAL
  Filled 2014-09-12 (×3): qty 1

## 2014-09-12 MED ORDER — LISINOPRIL-HYDROCHLOROTHIAZIDE 20-12.5 MG PO TABS: 1.0000 | ORAL_TABLET | Freq: Two times a day (BID) | ORAL | Status: DC

## 2014-09-12 MED ORDER — ROCURONIUM BROMIDE 50 MG/5ML IV SOLN
INTRAVENOUS | Status: AC
  Filled 2014-09-12: qty 1

## 2014-09-12 MED ORDER — OXYCODONE HCL 5 MG PO TABS
ORAL_TABLET | ORAL | Status: AC
  Filled 2014-09-12: qty 1

## 2014-09-12 MED ORDER — DICLOFENAC SODIUM 1 % TD GEL
4.0000 g | Freq: Four times a day (QID) | TRANSDERMAL | Status: DC
  Filled 2014-09-12: qty 100

## 2014-09-12 MED ORDER — FENTANYL CITRATE 0.05 MG/ML IJ SOLN
INTRAMUSCULAR | Status: DC | PRN
  Administered 2014-09-12 (×5): 50 ug via INTRAVENOUS

## 2014-09-12 MED ORDER — MORPHINE SULFATE 2 MG/ML IJ SOLN: 2.0000 mg | INTRAMUSCULAR | Status: DC | PRN

## 2014-09-12 MED ORDER — THROMBIN 20000 UNITS EX SOLR
CUTANEOUS | Status: DC | PRN
  Administered 2014-09-12: 20 mL via TOPICAL

## 2014-09-12 MED ORDER — MIDAZOLAM HCL 5 MG/5ML IJ SOLN
INTRAMUSCULAR | Status: DC | PRN
  Administered 2014-09-12: 2 mg via INTRAVENOUS

## 2014-09-12 MED ORDER — ONDANSETRON HCL 4 MG/2ML IJ SOLN
INTRAMUSCULAR | Status: DC | PRN
  Administered 2014-09-12: 4 mg via INTRAVENOUS

## 2014-09-12 MED ORDER — METOPROLOL TARTRATE 1 MG/ML IV SOLN: 2.0000 mg | INTRAVENOUS | Status: DC | PRN

## 2014-09-12 MED ORDER — LINAGLIPTIN 5 MG PO TABS
5.0000 mg | ORAL_TABLET | Freq: Every day | ORAL | Status: DC
  Administered 2014-09-13 – 2014-09-14 (×2): 5 mg via ORAL
  Filled 2014-09-12 (×3): qty 1

## 2014-09-12 MED ORDER — CEFUROXIME SODIUM 1.5 G IJ SOLR
1.5000 g | Freq: Two times a day (BID) | INTRAMUSCULAR | Status: AC
  Administered 2014-09-12 – 2014-09-13 (×2): 1.5 g via INTRAVENOUS
  Filled 2014-09-12 (×2): qty 1.5

## 2014-09-12 MED ORDER — FENTANYL CITRATE 0.05 MG/ML IJ SOLN
25.0000 ug | INTRAMUSCULAR | Status: DC | PRN
  Administered 2014-09-12 (×2): 25 ug via INTRAVENOUS

## 2014-09-12 MED ORDER — GLYCOPYRROLATE 0.2 MG/ML IJ SOLN
INTRAMUSCULAR | Status: DC | PRN
  Administered 2014-09-12: .8 mg via INTRAVENOUS

## 2014-09-12 MED ORDER — LABETALOL HCL 5 MG/ML IV SOLN: 10.0000 mg | INTRAVENOUS | Status: DC | PRN

## 2014-09-12 MED ORDER — METOPROLOL SUCCINATE ER 50 MG PO TB24
75.0000 mg | ORAL_TABLET | Freq: Two times a day (BID) | ORAL | Status: DC
  Administered 2014-09-14: 75 mg via ORAL
  Filled 2014-09-12 (×6): qty 1

## 2014-09-12 MED ORDER — PHENYLEPHRINE HCL 10 MG/ML IJ SOLN
10.0000 mg | INTRAVENOUS | Status: DC | PRN
  Administered 2014-09-12: 30 ug/min via INTRAVENOUS

## 2014-09-12 MED ORDER — LIDOCAINE-EPINEPHRINE (PF) 1 %-1:200000 IJ SOLN
INTRAMUSCULAR | Status: DC | PRN
  Administered 2014-09-12: 8 mL

## 2014-09-12 MED ORDER — ONDANSETRON HCL 4 MG/2ML IJ SOLN
INTRAMUSCULAR | Status: AC
  Filled 2014-09-12: qty 2

## 2014-09-12 MED ORDER — HEPARIN SODIUM (PORCINE) 1000 UNIT/ML IJ SOLN
INTRAMUSCULAR | Status: DC | PRN
  Administered 2014-09-12: 10000 [IU] via INTRAVENOUS

## 2014-09-12 MED ORDER — ONDANSETRON HCL 4 MG/2ML IJ SOLN: 4.0000 mg | Freq: Four times a day (QID) | INTRAMUSCULAR | Status: DC | PRN

## 2014-09-12 MED ORDER — LACTATED RINGERS IV SOLN
INTRAVENOUS | Status: DC | PRN
  Administered 2014-09-12: 08:00:00 via INTRAVENOUS

## 2014-09-12 MED ORDER — LISINOPRIL 20 MG PO TABS
20.0000 mg | ORAL_TABLET | Freq: Two times a day (BID) | ORAL | Status: DC
  Administered 2014-09-14: 20 mg via ORAL
  Filled 2014-09-12 (×5): qty 1

## 2014-09-12 MED ORDER — EPHEDRINE SULFATE 50 MG/ML IJ SOLN
INTRAMUSCULAR | Status: AC
  Filled 2014-09-12: qty 1

## 2014-09-12 MED ORDER — ROCURONIUM BROMIDE 100 MG/10ML IV SOLN
INTRAVENOUS | Status: DC | PRN
  Administered 2014-09-12: 40 mg via INTRAVENOUS
  Administered 2014-09-12: 10 mg via INTRAVENOUS

## 2014-09-12 MED ORDER — PHENOL 1.4 % MT LIQD: 1.0000 | OROMUCOSAL | Status: DC | PRN

## 2014-09-12 MED ORDER — LIDOCAINE-EPINEPHRINE (PF) 1 %-1:200000 IJ SOLN
INTRAMUSCULAR | Status: AC
  Filled 2014-09-12: qty 10

## 2014-09-12 MED ORDER — ACETAMINOPHEN 325 MG PO TABS
325.0000 mg | ORAL_TABLET | ORAL | Status: DC | PRN
  Administered 2014-09-13: 650 mg via ORAL
  Filled 2014-09-12: qty 2

## 2014-09-12 MED ORDER — PROTAMINE SULFATE 10 MG/ML IV SOLN
INTRAVENOUS | Status: DC | PRN
  Administered 2014-09-12: 30 mg via INTRAVENOUS
  Administered 2014-09-12: 10 mg via INTRAVENOUS

## 2014-09-12 MED ORDER — SODIUM CHLORIDE 0.9 % IV SOLN
INTRAVENOUS | Status: DC
  Administered 2014-09-12: 500 mL via INTRAVENOUS
  Administered 2014-09-12: 14:00:00 via INTRAVENOUS

## 2014-09-12 MED ORDER — LORAZEPAM 0.5 MG PO TABS: 0.5000 mg | ORAL_TABLET | Freq: Two times a day (BID) | ORAL | Status: DC | PRN

## 2014-09-12 MED ORDER — THROMBIN 20000 UNITS EX SOLR
CUTANEOUS | Status: AC
  Filled 2014-09-12: qty 20000

## 2014-09-12 MED ORDER — 0.9 % SODIUM CHLORIDE (POUR BTL) OPTIME
TOPICAL | Status: DC | PRN
  Administered 2014-09-12: 2000 mL

## 2014-09-12 MED ORDER — ATORVASTATIN CALCIUM 80 MG PO TABS
80.0000 mg | ORAL_TABLET | Freq: Every day | ORAL | Status: DC
  Administered 2014-09-12 – 2014-09-13 (×2): 80 mg via ORAL
  Filled 2014-09-12 (×3): qty 1

## 2014-09-12 MED ORDER — OXYCODONE HCL 5 MG PO TABS: 5.0000 mg | ORAL_TABLET | Freq: Four times a day (QID) | ORAL | Status: DC | PRN

## 2014-09-12 SURGICAL SUPPLY — 48 items
BAG DECANTER FOR FLEXI CONT (MISCELLANEOUS) ×5 IMPLANT
CANISTER SUCTION 2500CC (MISCELLANEOUS) ×3 IMPLANT
CANNULA VESSEL 3MM 2 BLNT TIP (CANNULA) ×5 IMPLANT
CATH ROBINSON RED A/P 18FR (CATHETERS) ×3 IMPLANT
CLIP TI MEDIUM 24 (CLIP) ×3 IMPLANT
CLIP TI WIDE RED SMALL 24 (CLIP) ×3 IMPLANT
CRADLE DONUT ADULT HEAD (MISCELLANEOUS) ×3 IMPLANT
DRAIN CHANNEL 15F RND FF W/TCR (WOUND CARE) IMPLANT
ELECT REM PT RETURN 9FT ADLT (ELECTROSURGICAL) ×3
ELECTRODE REM PT RTRN 9FT ADLT (ELECTROSURGICAL) ×1 IMPLANT
EVACUATOR SILICONE 100CC (DRAIN) IMPLANT
GLOVE BIO SURGEON STRL SZ 6.5 (GLOVE) ×3 IMPLANT
GLOVE BIO SURGEON STRL SZ7.5 (GLOVE) ×3 IMPLANT
GLOVE BIO SURGEONS STRL SZ 6.5 (GLOVE) ×3
GLOVE BIOGEL PI IND STRL 7.0 (GLOVE) IMPLANT
GLOVE BIOGEL PI IND STRL 8 (GLOVE) ×1 IMPLANT
GLOVE BIOGEL PI INDICATOR 7.0 (GLOVE) ×2
GLOVE BIOGEL PI INDICATOR 8 (GLOVE) ×4
GLOVE ECLIPSE 6.5 STRL STRAW (GLOVE) ×2 IMPLANT
GOWN STRL REUS W/ TWL LRG LVL3 (GOWN DISPOSABLE) ×3 IMPLANT
GOWN STRL REUS W/TWL LRG LVL3 (GOWN DISPOSABLE) ×12
KIT BASIN OR (CUSTOM PROCEDURE TRAY) ×3 IMPLANT
KIT ROOM TURNOVER OR (KITS) ×3 IMPLANT
KIT SHUNT ARGYLE CAROTID ART 6 (VASCULAR PRODUCTS) ×2 IMPLANT
LIQUID BAND (GAUZE/BANDAGES/DRESSINGS) ×3 IMPLANT
NDL 18GX1X1/2 (RX/OR ONLY) (NEEDLE) IMPLANT
NDL HYPO 25X1 1.5 SAFETY (NEEDLE) ×1 IMPLANT
NEEDLE 18GX1X1/2 (RX/OR ONLY) (NEEDLE) ×3 IMPLANT
NEEDLE HYPO 25X1 1.5 SAFETY (NEEDLE) ×3 IMPLANT
NS IRRIG 1000ML POUR BTL (IV SOLUTION) ×6 IMPLANT
PACK CAROTID (CUSTOM PROCEDURE TRAY) ×3 IMPLANT
PAD ARMBOARD 7.5X6 YLW CONV (MISCELLANEOUS) ×6 IMPLANT
PATCH HEMASHIELD 8X150 (Vascular Products) ×2 IMPLANT
PROBE PENCIL 8 MHZ STRL DISP (MISCELLANEOUS) ×2 IMPLANT
SHUNT CAROTID BYPASS 10 (VASCULAR PRODUCTS) IMPLANT
SHUNT CAROTID BYPASS 12FRX15.5 (VASCULAR PRODUCTS) IMPLANT
SPONGE INTESTINAL PEANUT (DISPOSABLE) ×3 IMPLANT
SPONGE SURGIFOAM ABS GEL 100 (HEMOSTASIS) ×2 IMPLANT
SUT PROLENE 6 0 BV (SUTURE) ×5 IMPLANT
SUT PROLENE 7 0 BV 1 (SUTURE) IMPLANT
SUT SILK 2 0 FS (SUTURE) IMPLANT
SUT SILK 3 0 (SUTURE) ×9
SUT SILK 3-0 18XBRD TIE 12 (SUTURE) IMPLANT
SUT VIC AB 3-0 SH 27 (SUTURE) ×3
SUT VIC AB 3-0 SH 27X BRD (SUTURE) ×1 IMPLANT
SUT VICRYL 4-0 PS2 18IN ABS (SUTURE) ×3 IMPLANT
SYR CONTROL 10ML LL (SYRINGE) ×3 IMPLANT
WATER STERILE IRR 1000ML POUR (IV SOLUTION) ×1 IMPLANT

## 2014-09-12 NOTE — Progress Notes (Signed)
   VASCULAR SURGERY POST OP CHECK:  * Doing well post op  * Anticipate d/c in AM.    SUBJECTIVE: No complaints  PHYSICAL EXAM: Filed Vitals:   09/12/14 1245 09/12/14 1315 09/12/14 1330 09/12/14 1345  BP:      Pulse: 71 68 57 57  Temp:  97.6 F (36.4 C)    TempSrc:  Oral    Resp: 26 26 21 22   SpO2: 97% 95% 96% 96%   Incision looks fine Neuro intact     Recent Labs  09/12/14 0552 09/12/14 1043  GLUCAP 175* 151*    Active Problems:   Bilateral carotid artery stenosis  [name] [name] Beeper: [phone] 09/12/2014

## 2014-09-12 NOTE — Plan of Care (Signed)
Problem: Phase I Progression Outcomes Goal: Vascular site scale level 0 - I Vascular Site Scale Level 0: No bruising/bleeding/hematoma Level I (Mild): Bruising/Ecchymosis, minimal bleeding/ooozing, palpable hematoma < 3 cm Level II (Moderate): Bleeding not affecting hemodynamic parameters, pseudoaneurysm, palpable hematoma > 3 cm Level III (Severe) Bleeding which affects hemodynamic parameters or retroperitoneal hemorrhage  NA

## 2014-09-12 NOTE — Telephone Encounter (Signed)
Left msg for pt re appt, dpm °

## 2014-09-12 NOTE — Progress Notes (Signed)
Report given to megan rn as caregiver 

## 2014-09-12 NOTE — Anesthesia Postprocedure Evaluation (Signed)
  Anesthesia Post-op Note  Patient: [name] [name]  Procedure(s) Performed: Procedure(s): LEFT CAROTID ARTERY ENDARTERECTOMY (Left) WITH DACRON PATCH ANGIOPLASTY (Left)  Patient Location: PACU  Anesthesia Type:General  Level of Consciousness: awake, alert  and oriented  Airway and Oxygen Therapy: Patient Spontanous Breathing and Patient connected to nasal cannula oxygen  Post-op Pain: mild  Post-op Assessment: Post-op Vital signs reviewed, Patient's Cardiovascular Status Stable, Respiratory Function Stable, Patent Airway, No signs of Nausea or vomiting and Pain level controlled  Post-op Vital Signs: stable  Last Vitals:  Filed Vitals:   09/12/14 1515  BP:   Pulse: 59  Temp:   Resp: 22    Complications: No apparent anesthesia complications

## 2014-09-12 NOTE — Progress Notes (Signed)
Dr. Scot Dock called about low BP and pt has not voided all day. Orders received.

## 2014-09-12 NOTE — Interval H&P Note (Signed)
History and Physical Interval Note:  09/12/2014 7:14 AM  [name] [name]  has presented today for surgery, with the diagnosis of Left internal carotid artery stenosis I65.22  The various methods of treatment have been discussed with the patient and family. After consideration of risks, benefits and other options for treatment, the patient has consented to  Procedure(s): ENDARTERECTOMY CAROTID (Left) as a surgical intervention .  The patient's history has been reviewed, patient examined, no change in status, stable for surgery.  I have reviewed the patient's chart and labs.  Questions were answered to the patient's satisfaction.     DICKSON,CHRISTOPHER S

## 2014-09-12 NOTE — Transfer of Care (Signed)
Immediate Anesthesia Transfer of Care Note  Patient: [name] [name]  Procedure(s) Performed: Procedure(s): LEFT CAROTID ARTERY ENDARTERECTOMY (Left) WITH DACRON PATCH ANGIOPLASTY (Left)  Patient Location: PACU  Anesthesia Type:General  Level of Consciousness: awake, patient cooperative and responds to stimulation  Airway & Oxygen Therapy: Patient Spontanous Breathing and Patient connected to face mask oxygen  Post-op Assessment: Report given to RN and Post -op Vital signs reviewed and stable  Post vital signs: Reviewed and stable  Last Vitals:  Filed Vitals:   09/12/14 0604  BP: 120/56  Pulse:   Temp:   Resp:     Complications: No apparent anesthesia complications

## 2014-09-12 NOTE — Op Note (Signed)
NAME: [name] [name]    MRN: [id number] DOB: October 13, 1935    DATE OF OPERATION: 09/12/2014  PREOP DIAGNOSIS: Asymptomatic greater than 80% left carotid stenosis  POSTOP DIAGNOSIS: Same  PROCEDURE: left carotid endarterectomy with Dacron patch angioplasty  SURGEON: Judeth Cornfield. Scot Dock, MD, FACS  ASSIST: Leontine Locket, PA  ANESTHESIA: Gen.   EBL: minimal  INDICATIONS: [name] [name] is a 79 y.o. female who was being followed with carotid disease. The stenosis on the left progressed to greater than 80%. She presents for elective left carotid endarterectomy.  FINDINGS: very extensive plaque extending low into the common carotid artery and also quite high. In order to allow visualization of the plaque distally the procedure was performed partly without a shunt. There was excellent backbleeding.  TECHNIQUE: The patient was taken to the operating room after an arterial line was placed by anesthesia. The patient received a general anesthetic. Her blood pressure did drop somewhat induction of anesthesia but this recovered quickly. After careful positioning, the left neck was prepped and draped in the usual sterile fashion. An incision was made along the anterior border of the sternocleidomastoid and dissection carried down to the common carotid artery which was dissected free and controlled with a Rummel tourniquet. The facial vein was divided between 2-0 silk ties. Of note, the plaque in the common carotid artery extended very low and therefore had to extend the incision and extending the dissection quite low on the common carotid artery. It did appear to be an area where the calcific plaque ended were I could clamp. Next the superior thyroid artery was controlled with a 2-0 silk tie. The external carotid artery branched immediately in the separate branches were controlled. The internal carotid artery was controlled above the plaque. There was some slight redundancy in the artery. The patient was  heparinized. Labs were then placed on the internal then the common and then the external carotid arteries. A longitudinal arteriotomy was made in the common carotid artery. I was unable to extend through the plaque as it was essentially occlusive. I therefore made an arteriotomy in the internal carotid artery above the plaque. The tension was placed into the internal carotid artery and back bled area there was excellent backbleeding. It was then placed into the common carotid artery and secured with Rummel tourniquet. Flow is reestablished to the shunt. An endarterectomy plane was established proximally. Again I had to extend very low onto the common carotid artery where it was able to get below the plaque. Eversion endarterectomy was performed of the external carotid artery. It was difficult to see distally as the plaque extended very high. For this reason I elected to remove the shunt area the internal and common carotid arteries were clamped. This allowed visualization distally. Distally there was a nice taper and the internal carotid artery and no tacking sutures were required. The artery was irrigated with copious amounts of heparin and dextran and all loose debris removed. A long Dacron patch was then selected. This was sewn with 2 continuous 6-0 Prolene sutures. Prior to completing the patch closure, the arteries were backbled and flushed and the anastomosis completed. Flow was reestablished first to the external carotid artery and into the internal carotid artery. At the completion was a good pulse and a good Doppler signal distal to the patch. Hemostasis was obtained in the wound. The heparin was partially reversed with protamine. The wound was then closed the deep and 3-0 Vicryl, the platysma was closed with running  3-0 Vicryl, the skin was closed with a 4-0 subcuticular stitch. The patient awoke neurologically intact. All needle and sponge counts were correct. She was transferred to the recovery room in  stable condition.  Deitra Mayo, MD, FACS [facility]  DATE OF DICTATION:   09/12/2014

## 2014-09-12 NOTE — Telephone Encounter (Signed)
-----   Message from Gabriel Earing, [place] sent at 09/12/2014 10:30 AM EST ----- S/p left CEA 09/12/14.  F/u with Dr. Scot Dock in 2 weeks.  Thanks, Aldona Bar

## 2014-09-12 NOTE — H&P (View-Only) (Signed)
[facility]  Patient name: [name] [name] MRN: [id number] DOB: [birth year] Sex: female  REASON FOR VISIT: Follow up of carotid disease.  HPI: [name] [name] is a 79 y.o. female who I last saw on 08/02/2012. She has a history of bilateral carotid stenoses and is followed in the [place] office. Back then, she had an asymptomatic 60-79% left carotid stenosis with a 40-59% right carotid stenosis. I explained that I would not consider carotid endarterectomy was the stenosis progressed to greater than 80% or she developed new neurologic symptoms. At that time, we discussed the importance of tobacco cessation. She was on Plavix. Her hypertension and cholesterol were being managed by Dr. Johnsie Cancel.  Her carotid duplex scans are done in the [place] office and on her most recent study on 08/19/2014, she had a high-end 60-79% left carotid stenosis. Dr. Johnsie Cancel recommended a CTA and follow up in our office to be considered for possible left carotid endarterectomy.  The patient denies any history of stroke, TIAs, expressive or receptive aphasia, or amaurosis fugax.   Past Medical History  Diagnosis Date  . Bruit   . Hyperlipidemia   . HTN (hypertension)   . CAD   . PVD   . VENOUS INSUFFICIENCY   . DM   . OBESITY   . Edema   . PONV (postoperative nausea and vomiting)   . Angina 1995  . Carotid artery occlusion   . Myocardial infarction 1995   Family History  Problem Relation Age of Onset  . Anesthesia problems Neg Hx   . Hypotension Neg Hx   . Malignant hyperthermia Neg Hx   . Pseudochol deficiency Neg Hx    SOCIAL HISTORY: History  Substance Use Topics  . Smoking status: Current Every Day Smoker -- 0.50 packs/day for 53 years    Types: Cigarettes  . Smokeless tobacco: Never Used     Comment: smoker since age 70  . Alcohol Use: No   No Known Allergies Current Outpatient Prescriptions  Medication Sig Dispense Refill  . atorvastatin (LIPITOR) 80 MG tablet TAKE  1 TABLET (80 MG TOTAL) BY MOUTH DAILY. 90 tablet 1  . cilostazol (PLETAL) 100 MG tablet TAKE 1 TABLET (100 MG TOTAL) BY MOUTH 2 (TWO) TIMES DAILY. 180 tablet 1  . clopidogrel (PLAVIX) 75 MG tablet Take 1 tablet (75 mg total) by mouth daily. 90 tablet 1  . furosemide (LASIX) 20 MG tablet TAKE 1 TABLET (20 MG TOTAL) BY MOUTH AS NEEDED FOR FLUID OR EDEMA. 30 tablet 5  . glimepiride (AMARYL) 2 MG tablet TAKE 1 TABLET (2 MG TOTAL) BY MOUTH DAILY BEFORE BREAKFAST. 90 tablet 0  . glucose blood test strip Use BID and PRN 100 each 5  . lisinopril-hydrochlorothiazide (PRINZIDE,ZESTORETIC) 20-12.5 MG per tablet Take 1 tablet by mouth 2 (two) times daily. 180 tablet 1  . LORazepam (ATIVAN) 0.5 MG tablet Take 1 tablet (0.5 mg total) by mouth 2 (two) times daily as needed for anxiety. 30 tablet 1  . metoprolol succinate (TOPROL-XL) 50 MG 24 hr tablet TAKE 1 AND 1/2 TABLETS BY MOUTH 2 (TWO) TIMES DAILY. 270 tablet 1  . nitroGLYCERIN (NITROSTAT) 0.4 MG SL tablet Place 1 tablet (0.4 mg total) under the tongue every 5 (five) minutes as needed for chest pain. 100 tablet 1  . ONETOUCH DELICA LANCETS FINE MISC 1 each by Does not apply route 2 (two) times daily. 100 each 5  . TRADJENTA 5 MG TABS tablet TAKE 1 TABLET (  5 MG TOTAL) BY MOUTH DAILY. 90 tablet 1  . VOLTAREN 1 % GEL USE 4 GRAMS TO BOTH HANDS 4 TIMES A DAY 300 g 0   No current facility-administered medications for this visit.   REVIEW OF SYSTEMS: Valu.Nieves ] denotes positive finding; [  ] denotes negative finding  CARDIOVASCULAR:  [ ]  chest pain   [ ]  chest pressure   [ ]  palpitations   [ ]  orthopnea   [ ]  dyspnea on exertion   [ ]  claudication   [ ]  rest pain   [ ]  DVT   [ ]  phlebitis PULMONARY:   [ ]  productive cough   [ ]  asthma   [ ]  wheezing NEUROLOGIC:   [ ]  weakness  [ ]  paresthesias  [ ]  aphasia  [ ]  amaurosis  [ ]  dizziness HEMATOLOGIC:   [ ]  bleeding problems   [ ]  clotting disorders MUSCULOSKELETAL:  [ ]  joint pain   [ ]  joint swelling [ ]  leg  swelling GASTROINTESTINAL: [ ]   blood in stool  [ ]   hematemesis GENITOURINARY:  [ ]   dysuria  [ ]   hematuria PSYCHIATRIC:  [ ]  history of major depression INTEGUMENTARY:  [ ]  rashes  [ ]  ulcers CONSTITUTIONAL:  [ ]  fever   [ ]  chills  PHYSICAL EXAM: Filed Vitals:   09/05/14 1347 09/05/14 1348  BP: 139/56 93/53  Pulse: 77 62  Resp: 18   Height: 5' (1.524 m)   Weight: 247 lb (112.038 kg)    Body mass index is 48.24 kg/(m^2). GENERAL: The patient is a well-nourished female, in no acute distress. The vital signs are documented above. CARDIOVASCULAR: There is a regular rate and rhythm. She has soft bilateral carotid bruits. PULMONARY: There is good air exchange bilaterally without wheezing or rales. ABDOMEN: Soft and non-tender with normal pitched bowel sounds.  MUSCULOSKELETAL: There are no major deformities or cyanosis. NEUROLOGIC: No focal weakness or paresthesias are detected. SKIN: There are no ulcers or rashes noted. PSYCHIATRIC: The patient has a normal affect.  DATA:  CTA on 08/28/2014 showed a 40% proximal right internal carotid artery stenosis with a heavily calcified left carotid stenosis. It was interpreted as possibly showing a greater than 75% stenosis on the left although it was difficult to determine because of her calcific disease.  I have reviewed the duplex scan from the [facility] vascular lab. There is a greater than 80% left carotid stenosis. Peak systolic velocity is 712 cm/s with an end-diastolic velocity of 197 cm/s. ICA to CCA ratio is 4.4. This stenosis is in the proximal internal carotid artery. There is a 60-79% right carotid stenosis in the lower end of that range. The patient does have retrograde flow in the right vertebral artery and the right brachial pressure is lower.  MEDICAL ISSUES:  ASYMPTOMATIC GREATER THAN 80% LEFT CAROTID STENOSIS: given that the stenosis on the left is progressed to greater than 80%, I have recommended left carotid endarterectomy. I  have reviewed the indications for carotid endarterectomy, that is to lower the risk of future stroke. I have also reviewed the potential complications of surgery, including but not limited to: bleeding, stroke (perioperative risk 1-2%), MI, nerve injury of other unpredictable medical problems. All of the patients questions were answered and they are agreeable to proceed with surgery. I would like to stop her Plavix 5 days preoperatively and have her take aspirin perioperatively. Postoperatively we can get her back on Plavix and stop her aspirin. Her surgery is scheduled  for 09/12/2014.   She has been evaluated by Dr. Jenkins Rouge and cleared from a cardiac standpoint for surgery.  [place] [facility] [place] Beeper: [phone]

## 2014-09-12 NOTE — Progress Notes (Signed)
Utilization Review Completed.Aleila Syverson T2/18/2016  

## 2014-09-12 NOTE — Progress Notes (Signed)
Dr. Bridgett Larsson called re: no urine output, BP better after saline boluses. To watch pt for now.

## 2014-09-12 NOTE — Anesthesia Preprocedure Evaluation (Signed)
Anesthesia Evaluation    Airway Mallampati: II  TM Distance: >3 FB Neck ROM: Full    Dental  (+) Edentulous Upper, Edentulous Lower   Pulmonary Current Smoker,  breath sounds clear to auscultation        Cardiovascular hypertension, Rhythm:Regular Rate:Normal     Neuro/Psych    GI/Hepatic   Endo/Other  diabetes  Renal/GU      Musculoskeletal   Abdominal (+) + obese,   Peds  Hematology   Anesthesia Other Findings   Reproductive/Obstetrics                             Anesthesia Physical Anesthesia Plan  ASA: III  Anesthesia Plan: General   Post-op Pain Management:    Induction: Intravenous  Airway Management Planned: Oral ETT  Additional Equipment: Arterial line  Intra-op Plan:   Post-operative Plan:   Informed Consent: I have reviewed the patients History and Physical, chart, labs and discussed the procedure including the risks, benefits and alternatives for the proposed anesthesia with the patient or authorized representative who has indicated his/her understanding and acceptance.     Plan Discussed with: CRNA and Anesthesiologist  Anesthesia Plan Comments:         Anesthesia Quick Evaluation

## 2014-09-13 ENCOUNTER — Inpatient Hospital Stay (HOSPITAL_COMMUNITY): Payer: Medicare Other

## 2014-09-13 DIAGNOSIS — R0902 Hypoxemia: Secondary | ICD-10-CM

## 2014-09-13 DIAGNOSIS — E662 Morbid (severe) obesity with alveolar hypoventilation: Secondary | ICD-10-CM

## 2014-09-13 DIAGNOSIS — G4733 Obstructive sleep apnea (adult) (pediatric): Secondary | ICD-10-CM

## 2014-09-13 LAB — BASIC METABOLIC PANEL
Anion gap: 3 — ABNORMAL LOW (ref 5–15)
BUN: 17 mg/dL (ref 6–23)
CALCIUM: 7.2 mg/dL — AB (ref 8.4–10.5)
CO2: 26 mmol/L (ref 19–32)
Chloride: 112 mmol/L (ref 96–112)
Creatinine, Ser: 1.63 mg/dL — ABNORMAL HIGH (ref 0.50–1.10)
GFR calc Af Amer: 34 mL/min — ABNORMAL LOW (ref >90)
GFR calc non Af Amer: 30 mL/min — ABNORMAL LOW (ref >90)
GLUCOSE: 120 mg/dL — AB (ref 70–99)
Potassium: 4.8 mmol/L (ref 3.5–5.1)
Sodium: 141 mmol/L (ref 135–145)

## 2014-09-13 LAB — CBC
HCT: 29.5 % — ABNORMAL LOW (ref 36.0–46.0)
Hemoglobin: 8.8 g/dL — ABNORMAL LOW (ref 12.0–15.0)
MCH: 30.3 pg (ref 26.0–34.0)
MCHC: 29.8 g/dL — ABNORMAL LOW (ref 30.0–36.0)
MCV: 101.7 fL — AB (ref 78.0–100.0)
PLATELETS: 93 10*3/uL — AB (ref 150–400)
RBC: 2.9 MIL/uL — ABNORMAL LOW (ref 3.87–5.11)
RDW: 15.4 % (ref 11.5–15.5)
WBC: 6.8 10*3/uL (ref 4.0–10.5)

## 2014-09-13 LAB — GLUCOSE, CAPILLARY
Glucose-Capillary: 144 mg/dL — ABNORMAL HIGH (ref 70–99)
Glucose-Capillary: 132 mg/dL — ABNORMAL HIGH (ref 70–99)
Glucose-Capillary: 154 mg/dL — ABNORMAL HIGH (ref 70–99)

## 2014-09-13 MED ORDER — IPRATROPIUM-ALBUTEROL 0.5-2.5 (3) MG/3ML IN SOLN
3.0000 mL | Freq: Four times a day (QID) | RESPIRATORY_TRACT | Status: DC
  Administered 2014-09-13 – 2014-09-14 (×4): 3 mL via RESPIRATORY_TRACT
  Filled 2014-09-13 (×4): qty 3

## 2014-09-13 MED ORDER — IPRATROPIUM-ALBUTEROL 0.5-2.5 (3) MG/3ML IN SOLN
RESPIRATORY_TRACT | Status: AC
  Administered 2014-09-13: 13:00:00
  Filled 2014-09-13: qty 3

## 2014-09-13 MED ORDER — IPRATROPIUM-ALBUTEROL 0.5-2.5 (3) MG/3ML IN SOLN
3.0000 mL | RESPIRATORY_TRACT | Status: DC
  Administered 2014-09-13: 3 mL via RESPIRATORY_TRACT

## 2014-09-13 NOTE — Progress Notes (Signed)
Attempted room air again, pt's sat down to 80%, resumed O2 at 2L

## 2014-09-13 NOTE — Progress Notes (Signed)
Called to assess patient for decreasing sats when oxygen removed.  Patient has significant COPD history and states she still smokes, expiratory wheezes notes, congested non productive cough.  Ordered Duoneb Q6 hours, after initial neb removed from oxygen for small period of time and Pulse Ox readings dropped again to the 80's.  Oxygen at 2lpm [place] placed back on patient.  Patient had loose congested cough after neb and increased aeration with audible wheezes.

## 2014-09-13 NOTE — Progress Notes (Addendum)
   [facility]   Subjective  - She has voided and tolerated PO's without difficulty.  She has a chronic dry cough due to taking Lisinopril.  Her O2 SATs drop in the upper 80's when the O2 is turned off to room air.  She states she feels fine.   Objective 111/30 92 98.9 F (37.2 C) (Oral) 23 96%  Intake/Output Summary (Last 24 hours) at 09/13/14 0721 Last data filed at 09/13/14 0600  Gross per 24 hour  Intake   4460 ml  Output    675 ml  Net   3785 ml    Palpable radial pulses bil Left neck incision clean and dry with ecchymosis and min edema.  No hematoma No slurred speech , no tongue deviation and symmetrical smile. Lungs chronic cough, non productive.  Lungs CTA.  O2 SAT 86-90 RA    Assessment/Planning: POD # 1 left CEA  We will let her go home if she maintains O2 SAT of 90+ on RA Pending re-evaluation later today.   Laurence Slate [facility] 09/13/2014 7:21 AM --  Laboratory Lab Results:  Recent Labs  09/11/14 1331 09/13/14 0422  WBC 9.3 6.8  HGB 12.4 8.8*  HCT 39.9 29.5*  PLT 120* 93*   BMET  Recent Labs  09/11/14 1331 09/13/14 0422  NA 139 141  K 4.7 4.8  CL 108 112  CO2 18* 26  GLUCOSE 138* 120*  BUN 20 17  CREATININE 1.73* 1.63*  CALCIUM 8.6 7.2*    COAG Lab Results  Component Value Date   INR 0.96 09/11/2014   No results found for: PTT   Agree with above.   Deitra Mayo, MD, [place] [phone] Office: [phone]

## 2014-09-13 NOTE — Progress Notes (Signed)
   VASCULAR SURGERY ASSESSMENT & PLAN:  * 1 Day Post-Op s/p: Left CEA  * I think her pulmonary status is at its baseline.   *  Doing well. Home later today.  SUBJECTIVE: No specific complaints.  PHYSICAL EXAM: Filed Vitals:   09/13/14 0748 09/13/14 0751 09/13/14 0800 09/13/14 0900  BP:   93/35 96/42  Pulse: 92 91 99 101  Temp:    98.1 F (36.7 C)  TempSrc:      Resp: 23 20 21 27   Height:      Weight:      SpO2: 79% 94% 93% 94%   Incision looks fine. Neuro: intact  LABS: Lab Results  Component Value Date   WBC 6.8 09/13/2014   HGB 8.8* 09/13/2014   HCT 29.5* 09/13/2014   MCV 101.7* 09/13/2014   PLT 93* 09/13/2014   Lab Results  Component Value Date   CREATININE 1.63* 09/13/2014   CBG (last 3)   Recent Labs  09/12/14 1043 09/12/14 2144 09/13/14 0859  GLUCAP 151* 116* 144*    Active Problems:   Bilateral carotid artery stenosis   Gae Gallop Beeper: [phone] 09/13/2014

## 2014-09-13 NOTE — Progress Notes (Signed)
Ambulated in hall 300 feet with 4 stops for SOB and wheezing (pt ambulated on 2LO2. Sats 88-91% with HR up to the 130's.

## 2014-09-13 NOTE — Consult Note (Signed)
Name: [name] [name] MRN: [id number] DOB: 05-21-1936    ADMISSION DATE:  09/12/2014 CONSULTATION DATE:  2/19   REFERRING MD :  Scot Dock   CHIEF COMPLAINT:  Hypoxia   BRIEF PATIENT DESCRIPTION:  79 year old female w/ sig h/o obesity, DM, and HTN who underwent left CEA on 2/18 for asymptomatic 80% carotid stenosis. Case was unremarkable. Post-op the next morning noted to have asymptomatic exertional hypoxia w/ sats drifting into low 80s. PCCM was asked to evaluate prior to d/c on 2/19  SIGNIFICANT EVENTS   STUDIES:     HISTORY OF PRESENT ILLNESS:   79 year old female who underwent left CEA on 2/18 for asymptomatic 80% carotid stenosis. Case was unremarkable. Post-op the next morning noted to have asymptomatic exertional hypoxia w/ sats drifting into low 80s. PCCM was asked to evaluate prior to d/c on 2/19  PAST MEDICAL HISTORY :   has a past medical history of Bruit; Hyperlipidemia; HTN (hypertension); CAD; PVD; VENOUS INSUFFICIENCY; DM; OBESITY; Edema; PONV (postoperative nausea and vomiting); Angina (1995); Carotid artery occlusion; Myocardial infarction (1995); Shortness of breath dyspnea; Anxiety; Depression; and Cough.  has past surgical history that includes Coronary angioplasty with stent (2006); Cataract extraction w/PHACO (11/01/2011); Cataract extraction w/PHACO (01/31/2012); and Eye surgery. Prior to Admission medications   Medication Sig Start Date End Date Taking? Authorizing Provider  atorvastatin (LIPITOR) 80 MG tablet TAKE 1 TABLET (80 MG TOTAL) BY MOUTH DAILY. 08/02/14  Yes Josue Hector, MD  cilostazol (PLETAL) 100 MG tablet TAKE 1 TABLET (100 MG TOTAL) BY MOUTH 2 (TWO) TIMES DAILY. 08/02/14  Yes Josue Hector, MD  clopidogrel (PLAVIX) 75 MG tablet Take 1 tablet (75 mg total) by mouth daily. 08/02/14  Yes Josue Hector, MD  glimepiride (AMARYL) 2 MG tablet TAKE 1 TABLET (2 MG TOTAL) BY MOUTH DAILY BEFORE BREAKFAST. 08/05/14  Yes Wardell Honour, MD  glucose blood test strip  Use BID and PRN 08/27/14  Yes Wardell Honour, MD  lisinopril-hydrochlorothiazide (PRINZIDE,ZESTORETIC) 20-12.5 MG per tablet Take 1 tablet by mouth 2 (two) times daily. 08/02/14  Yes Josue Hector, MD  LORazepam (ATIVAN) 0.5 MG tablet Take 1 tablet (0.5 mg total) by mouth 2 (two) times daily as needed for anxiety. 05/24/14  Yes Wardell Honour, MD  metoprolol succinate (TOPROL-XL) 50 MG 24 hr tablet TAKE 1 AND 1/2 TABLETS BY MOUTH 2 (TWO) TIMES DAILY. 08/02/14  Yes Josue Hector, MD  nitroGLYCERIN (NITROSTAT) 0.4 MG SL tablet Place 1 tablet (0.4 mg total) under the tongue every 5 (five) minutes as needed for chest pain. 08/02/14  Yes Josue Hector, MD  [facility] DELICA LANCETS FINE MISC 1 each by Does not apply route 2 (two) times daily. 08/27/14  Yes Wardell Honour, MD  TRADJENTA 5 MG TABS tablet TAKE 1 TABLET (5 MG TOTAL) BY MOUTH DAILY.   Yes Rosanna-Margaret Hassell Done, FNP  diclofenac sodium (VOLTAREN) 1 % GEL Apply 2 g topically daily as needed. For pain    Historical Provider, MD  furosemide (LASIX) 20 MG tablet TAKE 1 TABLET (20 MG TOTAL) BY MOUTH AS NEEDED FOR FLUID OR EDEMA. 08/02/14   Josue Hector, MD  oxyCODONE (ROXICODONE) 5 MG immediate release tablet Take 1 tablet (5 mg total) by mouth every 6 (six) hours as needed. 09/12/14   Samantha J Rhyne, PA-C  VOLTAREN 1 % GEL USE 4 GRAMS TO BOTH HANDS 4 TIMES A DAY 05/20/14   Wardell Honour, MD  No Known Allergies  FAMILY HISTORY:  family history is negative for Anesthesia problems, Hypotension, Malignant hyperthermia, and Pseudochol deficiency. SOCIAL HISTORY:  reports that she has been smoking Cigarettes.  She has a 26.5 pack-year smoking history. She has never used smokeless tobacco. She reports that she does not drink alcohol or use illicit drugs.  REVIEW OF SYSTEMS:   Constitutional: Negative for fever, chills, weight loss, malaise/fatigue and diaphoresis.  HENT: Negative for hearing loss, ear pain, nosebleeds, congestion, sore throat, neck  pain, tinnitus and ear discharge.  Post-op neck discomfort only  Eyes: Negative for blurred vision, double vision, photophobia, pain, discharge and redness.  Respiratory: Negative for cough, hemoptysis, sputum production, shortness of breath, wheezing and stridor.   Cardiovascular: Negative for chest pain, palpitations, orthopnea, claudication, leg swelling and PND.  Gastrointestinal: Negative for heartburn, nausea, vomiting, abdominal pain, diarrhea, constipation, blood in stool and melena.  Genitourinary: Negative for dysuria, urgency, frequency, hematuria and flank pain.  Musculoskeletal: Negative for myalgias, back pain, joint pain and falls.  Skin: Negative for itching and rash.  Neurological: Negative for dizziness, tingling, tremors, sensory change, speech change, focal weakness, seizures, loss of consciousness, weakness and headaches.  Endo/Heme/Allergies: Negative for environmental allergies and polydipsia. Does not bruise/bleed easily.  SUBJECTIVE:   VITAL SIGNS: Temp:  [98.1 F (36.7 C)-98.9 F (37.2 C)] 98.2 F (36.8 C) (02/19 1200) Pulse Rate:  [57-132] 119 (02/19 1400) Resp:  [18-40] 23 (02/19 1400) BP: (80-121)/(21-63) 118/39 mmHg (02/19 1400) SpO2:  [79 %-99 %] 92 % (02/19 1400) Arterial Line BP: (83-131)/(31-48) 126/41 mmHg (02/19 0731)  PHYSICAL EXAMINATION: General:  Obese white female, in no distress.  Neuro:  Awake, oriented, no focal def  HEENT:  NCAT, left CEA incision intact Cardiovascular:  Tachy rrr  Lungs:  Basilar rales no wheeze no upper airway noises  Abdomen:  Obese + bowel sounds  Musculoskeletal:  Intact  Skin:  LE edema w/ evidence of chronic venous stasis    Recent Labs Lab 09/11/14 1331 09/13/14 0422  NA 139 141  K 4.7 4.8  CL 108 112  CO2 18* 26  BUN 20 17  CREATININE 1.73* 1.63*  GLUCOSE 138* 120*    Recent Labs Lab 09/11/14 1331 09/13/14 0422  HGB 12.4 8.8*  HCT 39.9 29.5*  WBC 9.3 6.8  PLT 120* 93*   No results  found.  ASSESSMENT / PLAN:  Exertional Hypoxia Probable chronic respiratory failure Presume OHS/OSA Obesity  Grade I diastolic dysfunction  Tobacco abuse S/p left CEA  HTN  GERD  Discussion  Had very little symptom burden associated with her desaturation events. She has been sleeping in a chair for the last 11 years. She does have intermittent Lower extremity edema. All of this would support these observed events of hypoxia of being more of a chronic issue than acute, although certainly super-imposed edema vs post-op atelectasis could be a contributing factor.    Plan/rec CXR now. R/o edema/ATX-->if clear could still go home today w/ O2 Maximize pulmonary hygiene  PRN SABA (albuterol) Walking oximetry. Suspect she needs oxygen at home and may have needed it for some time F/u with Dr Elsworth Soho April 11 at 230pm  Needs sleep study as well as PFTs.   Erick Colace ACNP-BC [place] Pager # [phone] OR # [phone] if no answer  Patient is likely chronically hypoxemic.  She was perfectly comfortable in the low 80's on RA.  Therefore, will check a CXR, if clear then perform an ambulatory desaturation  study, arrange for home O2 and appointment made as above to see patient as outpatient.  May still go home today if O2 can be arranged.  Rush Farmer, M.D. [facility] Pulmonary/Critical Care Medicine. Pager: [phone]. After hours pager: [phone].  09/13/2014, 3:21 PM

## 2014-09-13 NOTE — Progress Notes (Signed)
Pt's sat down to the upper 70's on RA, O2 restarted at 2L with sat back up to 93%. Pt is in no distress, has been up to chair and drinking fluids.

## 2014-09-13 NOTE — Progress Notes (Signed)
Ambulated in hall 300 feet with O2 at 2l/MIN, hr INCREASED TO 140'S, DOE with wheezing.

## 2014-09-13 NOTE — Progress Notes (Signed)
RA sat 80's, MD aware. Pt gets wheezy on exertion. BP low (with none of her po BP meds given) MD aware. To monitor pt for now.

## 2014-09-14 ENCOUNTER — Telehealth: Payer: Self-pay | Admitting: Internal Medicine

## 2014-09-14 ENCOUNTER — Encounter (HOSPITAL_COMMUNITY): Payer: Self-pay | Admitting: Vascular Surgery

## 2014-09-14 DIAGNOSIS — R0902 Hypoxemia: Secondary | ICD-10-CM

## 2014-09-14 DIAGNOSIS — J9621 Acute and chronic respiratory failure with hypoxia: Secondary | ICD-10-CM

## 2014-09-14 LAB — GLUCOSE, CAPILLARY
Glucose-Capillary: 119 mg/dL — ABNORMAL HIGH (ref 70–99)
Glucose-Capillary: 140 mg/dL — ABNORMAL HIGH (ref 70–99)
Glucose-Capillary: 208 mg/dL — ABNORMAL HIGH (ref 70–99)

## 2014-09-14 NOTE — Progress Notes (Signed)
SATURATION QUALIFICATIONS: (This note is used to comply with regulatory documentation for home oxygen)  Patient Saturations on Room Air at Rest = 86%  Patient Saturations on Room Air while Ambulating = 79%  Patient Saturations on 4 Liters of oxygen while Ambulating = 90%  Please briefly explain why patient needs home oxygen: [place], RN

## 2014-09-14 NOTE — Telephone Encounter (Signed)
Triage  This patient has post hospital fu with Dr Elsworth Soho on 11/04/14. Can you get her in to see Tammy sooner please?  Thanks  Dr. Brand Males, M.D., [facility].C.P Pulmonary and Critical Care Medicine Staff Physician [place] [facility] Pager: [phone], If no answer or between  15:00h - 7:00h: call [phone]  09/14/2014 8:35 AM

## 2014-09-14 NOTE — Consult Note (Addendum)
Name: [name] [name] MRN: [id number] DOB: 1936-05-06    ADMISSION DATE:  09/12/2014 CONSULTATION DATE:  2/19   REFERRING MD :  Scot Dock   CHIEF COMPLAINT:  Hypoxia   BRIEF PATIENT DESCRIPTION:  79 year old female w/ sig h/o obesity, DM, and HTN who underwent left CEA on 2/18 for asymptomatic 80% carotid stenosis. Case was unremarkable. Post-op the next morning noted to have asymptomatic exertional hypoxia w/ sats drifting into low 80s. PCCM was asked to evaluate prior to d/c on 2/19   SUBJECTIVE:   09/14/2014: feels well. Sitting and eating. On 4L [place] 96%, On RA x 5 min sitting - 86%, No complaints. REady to go home. Home lasix not started by [facility]  .   VITAL SIGNS: Temp:  [97.5 F (36.4 C)-99 F (37.2 C)] 99 F (37.2 C) (02/20 0727) Pulse Rate:  [81-132] 130 (02/20 0700) Resp:  [18-40] 28 (02/20 0700) BP: (92-143)/(26-73) 141/54 mmHg (02/20 0700) SpO2:  [81 %-98 %] 96 % (02/20 0731) Weight:  [117.164 kg (258 lb 4.8 oz)] 117.164 kg (258 lb 4.8 oz) (02/20 0700)  PHYSICAL EXAMINATION: General:  Obese white female, in no distress.  Neuro:  Awake, oriented, no focal def  HEENT:  NCAT, left CEA incision intact Cardiovascular:  Tachy rrr  Lungs:  Basilar rales no wheeze no upper airway noises  - faint crackles only Abdomen:  Obese + bowel sounds  Musculoskeletal:  Intact  Skin:  LE edema w/ evidence of chronic venous stasis    Recent Labs Lab 09/11/14 1331 09/13/14 0422  NA 139 141  K 4.7 4.8  CL 108 112  CO2 18* 26  BUN 20 17  CREATININE 1.73* 1.63*  GLUCOSE 138* 120*    Recent Labs Lab 09/11/14 1331 09/13/14 0422  HGB 12.4 8.8*  HCT 39.9 29.5*  WBC 9.3 6.8  PLT 120* 93*   Dg Chest Port 1 View  09/13/2014   CLINICAL DATA:  Hypoxia  EXAM: PORTABLE CHEST - 1 VIEW  COMPARISON:  July 03, 2013  FINDINGS: There is atelectatic change in both lower lobes. Elsewhere lungs are clear. Heart is upper normal in size with pulmonary vascularity within normal limits. There is  atherosclerotic change in aorta. No adenopathy. No bone lesions.  IMPRESSION: Atelectatic change in both lower lobes. Lungs elsewhere clear. No change in cardiac silhouette.   Electronically Signed   By: Lowella Grip III M.D.   On: 09/13/2014 15:56    ASSESSMENT / PLAN:   Acute on  chronic respiratory failure - hypoxemic Presume OHS/OSA Obesity  Grade I diastolic dysfunction  Tobacco abuse S/p left CEA  HTN  GERD  Discusssion 1. Acute recognition of chronic problem - likely OSA/OHV 2. Acute worsening definite possibility/likely  - acute on chronic mild diast dysfn  - post op atelectasis - definitely seen on CXR   Plan/rec Maximize pulmonary hygiene - emphasized icnentive spiro at home to patient PRN SABA (albuterol) Walking oximetry on RA now to prove atelectasis theory F/u with Dr Elsworth Soho April 11 at 230pm ; I will inform him to get our NP to see her sooner Definitely primary team to resume home lasix at dc   Needs sleep study as well as PFTs - will need to be set up at time of fu   Addendum 9:08 AM - on RA after 74min on RA - walked 300 feet - rest 86-89%, with walking lowest was 79% and then bumped up to 83% on RA. She felt well.  This is to be monitored   Dr. Brand Males, M.D., [facility].C.P Pulmonary and Critical Care Medicine Staff Physician [place] [facility] Pager: [phone], If no answer or between  15:00h - 7:00h: call [phone]  09/14/2014 8:33 AM

## 2014-09-14 NOTE — Discharge Instructions (Signed)
Please continue to use your incentive spirometer every morning and night.

## 2014-09-14 NOTE — Discharge Summary (Signed)
Discharge Summary     [name] [name] 12-31-1935 79 y.o. female  [id number]  Admission Date: 09/12/2014  Discharge Date: 09/14/14  Physician: Angelia Mould, MD  Admission Diagnosis: Left internal carotid artery stenosis I65.22   HPI:   This is a 79 y.o. female who I last saw on 08/02/2012. She has a history of bilateral carotid stenoses and is followed in the [place] office. Back then, she had an asymptomatic 60-79% left carotid stenosis with a 40-59% right carotid stenosis. I explained that I would not consider carotid endarterectomy was the stenosis progressed to greater than 80% or she developed new neurologic symptoms. At that time, we discussed the importance of tobacco cessation. She was on Plavix. Her hypertension and cholesterol were being managed by Dr. Johnsie Cancel.  Her carotid duplex scans are done in the [place] office and on her most recent study on 08/19/2014, she had a high-end 60-79% left carotid stenosis. Dr. Johnsie Cancel recommended a CTA and follow up in our office to be considered for possible left carotid endarterectomy.  The patient denies any history of stroke, TIAs, expressive or receptive aphasia, or amaurosis fugax.  Hospital Course:  The patient was admitted to the hospital and taken to the operating room on 09/12/2014 and underwent left carotid endarterectomy.  The pt tolerated the procedure well and was transported to the PACU in good condition.   By POD 1, the pt neuro status was in tact.  She had voided without difficulty.  She has chronic dry cough due to taking lisinopril.  Her O2 sats dropped to the 80's when O2 was removed.  She was also desaturating with exertion.  A pulmonary consult was obtained.  Per pulmonary, Had very little symptom burden associated with her desaturation events. She has been sleeping in a chair for the last 11 years. She does have intermittent Lower extremity edema. All of this would support these observed events of hypoxia of being  more of a chronic issue than acute, although certainly super-imposed edema vs post-op atelectasis could be a contributing factor.   They feel the pt is likely chronically hypoxemic.  She was perfectly comfortable in the low 80's on RA.  She had a CXR, which revealed atelectatic change in both lower lobes and lungs clear elsewhere.  No changes in cardiac silhouette.  She will f/u with Dr. Elsworth Soho with a sleep study and PFT's on an outpatient basis, which they will arrange.  The remainder of the hospital course consisted of increasing mobilization and increasing intake of solids without difficulty.    Recent Labs  09/11/14 1331 09/13/14 0422  NA 139 141  K 4.7 4.8  CL 108 112  CO2 18* 26  GLUCOSE 138* 120*  BUN 20 17  CALCIUM 8.6 7.2*    Recent Labs  09/11/14 1331 09/13/14 0422  WBC 9.3 6.8  HGB 12.4 8.8*  HCT 39.9 29.5*  PLT 120* 93*    Recent Labs  09/11/14 1331  INR 0.96    Discharge Instructions:   The patient is discharged with extensive instructions on wound care and progressive ambulation.  They are instructed not to drive or perform any heavy lifting until returning to see the physician in his office.  Discharge Instructions    CAROTID Sugery: Call MD for difficulty swallowing or speaking; weakness in arms or legs that is a new symtom; severe headache.  If you have increased swelling in the neck and/or  are having difficulty breathing, CALL [phone]    Complete by:  As directed      Call MD for:  redness, tenderness, or signs of infection (pain, swelling, bleeding, redness, odor or green/yellow discharge around incision site)    Complete by:  As directed      Call MD for:  severe or increased pain, loss or decreased feeling  in affected limb(s)    Complete by:  As directed      Call MD for:  temperature >100.5    Complete by:  As directed      Discharge wound care:    Complete by:  As directed   Shower daily with soap and water starting 09/14/14     Driving  Restrictions    Complete by:  As directed   No driving for 2 weeks     Lifting restrictions    Complete by:  As directed   No lifting for 2 weeks     Resume previous diet    Complete by:  As directed            Discharge Diagnosis:  Left internal carotid artery stenosis I65.22  Secondary Diagnosis: Patient Active Problem List   Diagnosis Date Noted  . Bilateral carotid artery stenosis 09/12/2014  . CKD (chronic kidney disease), stage III 11/02/2013  . SMOKER 05/30/2009  . Bilateral carotid bruits 05/30/2009  . DM (diabetes mellitus) type 2, uncontrolled, with ketoacidosis 05/29/2009  . Hyperlipidemia 05/29/2009  . OBESITY 05/29/2009  . Essential hypertension 05/29/2009  . Coronary atherosclerosis 05/29/2009  . PVD 05/29/2009  . VENOUS INSUFFICIENCY 05/29/2009  . Edema 05/29/2009   Past Medical History  Diagnosis Date  . Bruit   . Hyperlipidemia   . HTN (hypertension)   . CAD   . PVD   . VENOUS INSUFFICIENCY   . DM   . OBESITY   . Edema   . PONV (postoperative nausea and vomiting)   . Angina 1995  . Carotid artery occlusion   . Myocardial infarction 1995  . Shortness of breath dyspnea   . Anxiety   . Depression   . Cough       Medication List    TAKE these medications        atorvastatin 80 MG tablet  Commonly known as:  LIPITOR  TAKE 1 TABLET (80 MG TOTAL) BY MOUTH DAILY.     cilostazol 100 MG tablet  Commonly known as:  PLETAL  TAKE 1 TABLET (100 MG TOTAL) BY MOUTH 2 (TWO) TIMES DAILY.     clopidogrel 75 MG tablet  Commonly known as:  PLAVIX  Take 1 tablet (75 mg total) by mouth daily.     furosemide 20 MG tablet  Commonly known as:  LASIX  TAKE 1 TABLET (20 MG TOTAL) BY MOUTH AS NEEDED FOR FLUID OR EDEMA.     glimepiride 2 MG tablet  Commonly known as:  AMARYL  TAKE 1 TABLET (2 MG TOTAL) BY MOUTH DAILY BEFORE BREAKFAST.     glucose blood test strip  Use BID and PRN     lisinopril-hydrochlorothiazide 20-12.5 MG per tablet  Commonly  known as:  PRINZIDE,ZESTORETIC  Take 1 tablet by mouth 2 (two) times daily.     LORazepam 0.5 MG tablet  Commonly known as:  ATIVAN  Take 1 tablet (0.5 mg total) by mouth 2 (two) times daily as needed for anxiety.     metoprolol succinate 50 MG 24 hr tablet  Commonly known as:  TOPROL-XL  TAKE 1 AND 1/2 TABLETS BY MOUTH 2 (TWO) TIMES  DAILY.     nitroGLYCERIN 0.4 MG SL tablet  Commonly known as:  NITROSTAT  Place 1 tablet (0.4 mg total) under the tongue every 5 (five) minutes as needed for chest pain.     ONETOUCH DELICA LANCETS FINE Misc  1 each by Does not apply route 2 (two) times daily.     oxyCODONE 5 MG immediate release tablet  Commonly known as:  ROXICODONE  Take 1 tablet (5 mg total) by mouth every 6 (six) hours as needed.     TRADJENTA 5 MG Tabs tablet  Generic drug:  linagliptin  TAKE 1 TABLET (5 MG TOTAL) BY MOUTH DAILY.     diclofenac sodium 1 % Gel  Commonly known as:  VOLTAREN  Apply 2 g topically daily as needed. For pain     VOLTAREN 1 % Gel  Generic drug:  diclofenac sodium  USE 4 GRAMS TO BOTH HANDS 4 TIMES A DAY        Roxicodone #20 No Refill  Disposition: home with home O2  Patient's condition: is Good  Follow up: 1. Dr. Scot Dock  in 2 weeks.   Leontine Locket, PA-C [facility] [phone]  --- For [facility] use --- Instructions: Press F2 to tab through selections.  Delete question if not applicable.   Modified Rankin score at D/C (0-6): 0  IV medication needed for:  1. Hypertension: No 2. Hypotension: No  Post-op Complications:  Unable to wean O2.  Pulmonary consult obtained-acute recognition of chronic problem-likely OSA/OHV.  Home with home O2.  1. Post-op CVA or TIA: No  If yes: Event classification (right eye, left eye, right cortical, left cortical, verterobasilar, other): n/a  If yes: Timing of event (intra-op, <6 hrs post-op, >=6 hrs post-op, unknown): n/a  2. CN injury: No  If yes: CN n/a injuried     3. Myocardial infarction: No  If yes: Dx by (EKG or clinical, Troponin): n/a  4.  CHF: No  5.  Dysrhythmia (new): No  6. Wound infection: No  7. Reperfusion symptoms: No  8. Return to OR: No  If yes: return to OR for (bleeding, neurologic, other CEA incision, other): n/a  Discharge medications: Statin use:  Yes If No: [ ]  For Medical reasons, [ ]  Non-compliant, [ ]  Not-indicated ASA use:  No  If No: [ ]  For Medical reasons, [ ]  Non-compliant, [ ]  Not-indicated Beta blocker use:  Yes If No: [ ]  For Medical reasons, [ ]  Non-compliant, [ ]  Not-indicated ACE-Inhibitor use:  Yes If No: [ ]  For Medical reasons, [ ]  Non-compliant, [ ]  Not-indicated P2Y12 Antagonist use: Yes, [x ] Plavix, [ ]  Plasugrel, [ ]  Ticlopinine, [ ]  Ticagrelor, [ ]  Other, [ ]  No for medical reason, [ ]  Non-compliant, [ ]  Not-indicated Anti-coagulant use:  No, [ ]  Warfarin, [ ]  Rivaroxaban, [ ]  Dabigatran, [ ]  Other, [ ]  No for medical reason, [ ]  Non-compliant, [ ]  Not-indicated  Agree with plans for d/c.  Deitra Mayo, MD, [place] [phone] Office: [phone]

## 2014-09-14 NOTE — Progress Notes (Signed)
   VASCULAR SURGERY ASSESSMENT & PLAN:  * 2 Days Post-Op s/p: left carotid endarterectomy  *  Appreciate pulmonary help. Plan discharge today on home O2.  SUBJECTIVE: no complaints.  PHYSICAL EXAM: Filed Vitals:   09/14/14 0600 09/14/14 0700 09/14/14 0727 09/14/14 0731  BP: 118/39 141/54    Pulse: 110 130    Temp:   99 F (37.2 C)   TempSrc:   Oral   Resp: 22 28    Height:      Weight:  258 lb 4.8 oz (117.164 kg)    SpO2: 95% 85%  96%   Left neck incision looks fine. Neuro intact.  LABS: Lab Results  Component Value Date   WBC 6.8 09/13/2014   HGB 8.8* 09/13/2014   HCT 29.5* 09/13/2014   MCV 101.7* 09/13/2014   PLT 93* 09/13/2014   Lab Results  Component Value Date   CREATININE 1.63* 09/13/2014   Lab Results  Component Value Date   INR 0.96 09/11/2014   CBG (last 3)   Recent Labs  09/13/14 1226 09/13/14 1611 09/14/14 0725  GLUCAP 132* 154* 140*    Active Problems:   Bilateral carotid artery stenosis   Acute on chronic respiratory failure   Gae Gallop Beeper: [phone] 09/14/2014

## 2014-09-15 NOTE — Care Management Note (Signed)
    Page 1 of 2   09/15/2014     7:17:48 AM CARE MANAGEMENT NOTE 09/15/2014  Patient:  [name] [name], [name] [name]   Account Number:  [id number]  Date Initiated:  09/14/2014  Documentation initiated by:  [facility]  Subjective/Objective Assessment:   adm: Left internal carotid artery stenosis I65.22     Action/Plan:   discharge planning   Anticipated DC Date:  09/14/2014   Anticipated DC Plan:  [place]  CM consult      [facility] Choice  HOME HEALTH   Choice offered to / List presented to:  C-1 Patient   DME arranged  [facility]      DME agency  [place] arranged  HH-1 RN  [place] [facility]      [place].   Status of service:  Completed, signed off Medicare Important Message given?   (If response is "NO", the following Medicare IM given date fields will be blank) Date Medicare IM given:   Medicare IM given by:   Date Additional Medicare IM given:   Additional Medicare IM given by:    Discharge Disposition:  [place]  Per UR Regulation:    If discussed at Long Length of Stay Meetings, dates discussed:    Comments:  09/14/14 CM met with pt in room to offer choice of home health agency.  Pt chooses [facility] to render HHPT/RN.  Address and contact information verified with pt.  CM called [facility] DME rep, Jeneen Rinks to please delver O2 to room prior to discharge.  Referral called to [facility] rep, [place].  No other CM needs were communicated.  Mariane Masters, BSN, CM [phone].

## 2014-09-16 DIAGNOSIS — I2510 Atherosclerotic heart disease of native coronary artery without angina pectoris: Secondary | ICD-10-CM | POA: Diagnosis not present

## 2014-09-16 DIAGNOSIS — F17210 Nicotine dependence, cigarettes, uncomplicated: Secondary | ICD-10-CM | POA: Diagnosis not present

## 2014-09-16 DIAGNOSIS — E119 Type 2 diabetes mellitus without complications: Secondary | ICD-10-CM | POA: Diagnosis not present

## 2014-09-16 DIAGNOSIS — I739 Peripheral vascular disease, unspecified: Secondary | ICD-10-CM | POA: Diagnosis not present

## 2014-09-16 DIAGNOSIS — N183 Chronic kidney disease, stage 3 (moderate): Secondary | ICD-10-CM | POA: Diagnosis not present

## 2014-09-16 DIAGNOSIS — I6521 Occlusion and stenosis of right carotid artery: Secondary | ICD-10-CM | POA: Diagnosis not present

## 2014-09-16 DIAGNOSIS — E669 Obesity, unspecified: Secondary | ICD-10-CM | POA: Diagnosis not present

## 2014-09-16 DIAGNOSIS — E785 Hyperlipidemia, unspecified: Secondary | ICD-10-CM | POA: Diagnosis not present

## 2014-09-16 DIAGNOSIS — I129 Hypertensive chronic kidney disease with stage 1 through stage 4 chronic kidney disease, or unspecified chronic kidney disease: Secondary | ICD-10-CM | POA: Diagnosis not present

## 2014-09-16 DIAGNOSIS — Z48812 Encounter for surgical aftercare following surgery on the circulatory system: Secondary | ICD-10-CM | POA: Diagnosis not present

## 2014-09-16 NOTE — Telephone Encounter (Signed)
Called and spoke to pt. Appt made with TP on 09/27/2014. Pt verbalized understanding and denied any further questions or concerns at this time.

## 2014-09-17 DIAGNOSIS — E119 Type 2 diabetes mellitus without complications: Secondary | ICD-10-CM | POA: Diagnosis not present

## 2014-09-17 DIAGNOSIS — I739 Peripheral vascular disease, unspecified: Secondary | ICD-10-CM | POA: Diagnosis not present

## 2014-09-17 DIAGNOSIS — I6521 Occlusion and stenosis of right carotid artery: Secondary | ICD-10-CM | POA: Diagnosis not present

## 2014-09-17 DIAGNOSIS — I129 Hypertensive chronic kidney disease with stage 1 through stage 4 chronic kidney disease, or unspecified chronic kidney disease: Secondary | ICD-10-CM | POA: Diagnosis not present

## 2014-09-17 DIAGNOSIS — I2510 Atherosclerotic heart disease of native coronary artery without angina pectoris: Secondary | ICD-10-CM | POA: Diagnosis not present

## 2014-09-17 DIAGNOSIS — Z48812 Encounter for surgical aftercare following surgery on the circulatory system: Secondary | ICD-10-CM | POA: Diagnosis not present

## 2014-09-19 DIAGNOSIS — E119 Type 2 diabetes mellitus without complications: Secondary | ICD-10-CM | POA: Diagnosis not present

## 2014-09-19 DIAGNOSIS — I129 Hypertensive chronic kidney disease with stage 1 through stage 4 chronic kidney disease, or unspecified chronic kidney disease: Secondary | ICD-10-CM | POA: Diagnosis not present

## 2014-09-19 DIAGNOSIS — Z48812 Encounter for surgical aftercare following surgery on the circulatory system: Secondary | ICD-10-CM | POA: Diagnosis not present

## 2014-09-19 DIAGNOSIS — I2510 Atherosclerotic heart disease of native coronary artery without angina pectoris: Secondary | ICD-10-CM | POA: Diagnosis not present

## 2014-09-19 DIAGNOSIS — I739 Peripheral vascular disease, unspecified: Secondary | ICD-10-CM | POA: Diagnosis not present

## 2014-09-19 DIAGNOSIS — I6521 Occlusion and stenosis of right carotid artery: Secondary | ICD-10-CM | POA: Diagnosis not present

## 2014-09-23 ENCOUNTER — Telehealth: Payer: Self-pay | Admitting: Vascular Surgery

## 2014-09-23 DIAGNOSIS — I6521 Occlusion and stenosis of right carotid artery: Secondary | ICD-10-CM | POA: Diagnosis not present

## 2014-09-23 DIAGNOSIS — Z48812 Encounter for surgical aftercare following surgery on the circulatory system: Secondary | ICD-10-CM | POA: Diagnosis not present

## 2014-09-23 DIAGNOSIS — I739 Peripheral vascular disease, unspecified: Secondary | ICD-10-CM | POA: Diagnosis not present

## 2014-09-23 DIAGNOSIS — E119 Type 2 diabetes mellitus without complications: Secondary | ICD-10-CM | POA: Diagnosis not present

## 2014-09-23 DIAGNOSIS — I129 Hypertensive chronic kidney disease with stage 1 through stage 4 chronic kidney disease, or unspecified chronic kidney disease: Secondary | ICD-10-CM | POA: Diagnosis not present

## 2014-09-23 DIAGNOSIS — I2510 Atherosclerotic heart disease of native coronary artery without angina pectoris: Secondary | ICD-10-CM | POA: Diagnosis not present

## 2014-09-23 NOTE — Telephone Encounter (Signed)
-----   Message from Sherrye Payor, RN sent at 09/23/2014  1:20 PM EST ----- Regarding: may need appt. time change 3/2 Contact: [phone] Please call this pt.  I answered a Triage call, and she was unaware of the appt. 3/2 @ 11:30 AM; she may have a conflict with getting her driver to bring her at that time.

## 2014-09-23 NOTE — Telephone Encounter (Signed)
I spoke with Ms Hofferber, she said that she was aware of her appointment and had a ride. She will be here.

## 2014-09-24 ENCOUNTER — Encounter: Payer: Self-pay | Admitting: Vascular Surgery

## 2014-09-25 ENCOUNTER — Encounter: Payer: Self-pay | Admitting: Vascular Surgery

## 2014-09-25 ENCOUNTER — Ambulatory Visit (INDEPENDENT_AMBULATORY_CARE_PROVIDER_SITE_OTHER): Payer: Self-pay | Admitting: Vascular Surgery

## 2014-09-25 VITALS — BP 162/68 | HR 60 | Resp 18 | Ht 60.25 in | Wt 252.2 lb

## 2014-09-25 DIAGNOSIS — Z48812 Encounter for surgical aftercare following surgery on the circulatory system: Secondary | ICD-10-CM

## 2014-09-25 NOTE — Progress Notes (Signed)
   Patient name: [name] [name] MRN: [id number] DOB: 12/08/1935 Sex: female   VQI Patient  REASON FOR VISIT: Follow up after left carotid endarterectomy.  HPI: [name] [name] is a 79 y.o. female who I saw on 09/05/2014 for follow up of her carotid disease. The stenosis on the left progressed to greater than 80%. On the right side she has 60-79% stenosis. Left carotid endarterectomy was recommended in order to lower her risk of future stroke. She underwent left carotid endarterectomy on 09/12/2014. She comes in for her first outpatient visit. Overall she's been doing well. She did have to start oxygen as we were unable to wean her off of this in the hospital. She is scheduled to follow up with pulmonary in the near future.  She denies any focal weakness or paresthesias. She denies any problems swallowing. She denies shortness of breath. She is on Plavix and is on a statin. She does not take aspirin because she is on Plavix.   REVIEW OF SYSTEMS: Valu.Nieves ] denotes positive finding; [  ] denotes negative finding  CARDIOVASCULAR:  [ ]  chest pain   [ ]  dyspnea on exertion    CONSTITUTIONAL:  [ ]  fever   [ ]  chills  PHYSICAL EXAM: Filed Vitals:   09/25/14 1131  BP: 162/68  Pulse: 60  Resp: 18  Height: 5' 0.25" (1.53 m)  Weight: 252 lb 3.2 oz (114.397 kg)   Body mass index is 48.87 kg/(m^2). GENERAL: The patient is a well-nourished female, in no acute distress. The vital signs are documented above. CARDIOVASCULAR: There is a regular rate and rhythm. PULMONARY: There is good air exchange bilaterally without wheezing or rales. Her incision is healing nicely. NEURO: She has no focal weakness or paresthesias.  MEDICAL ISSUES: The patient is doing well status post left carotid endarterectomy. She has a known 60-79% right carotid stenosis which is followed in Dr. Kyla Balzarine office. We will have her continue her follow up studies and his office. I will need to see her back in 1 year as part of our vascular  quality initiative (VQI) and have scheduled this appointment with our nurse practitioner.  [place] [facility] [place] Beeper: [phone]

## 2014-09-26 DIAGNOSIS — E119 Type 2 diabetes mellitus without complications: Secondary | ICD-10-CM | POA: Diagnosis not present

## 2014-09-26 DIAGNOSIS — I2510 Atherosclerotic heart disease of native coronary artery without angina pectoris: Secondary | ICD-10-CM | POA: Diagnosis not present

## 2014-09-26 DIAGNOSIS — Z48812 Encounter for surgical aftercare following surgery on the circulatory system: Secondary | ICD-10-CM | POA: Diagnosis not present

## 2014-09-26 DIAGNOSIS — I739 Peripheral vascular disease, unspecified: Secondary | ICD-10-CM | POA: Diagnosis not present

## 2014-09-26 DIAGNOSIS — I129 Hypertensive chronic kidney disease with stage 1 through stage 4 chronic kidney disease, or unspecified chronic kidney disease: Secondary | ICD-10-CM | POA: Diagnosis not present

## 2014-09-26 DIAGNOSIS — I6521 Occlusion and stenosis of right carotid artery: Secondary | ICD-10-CM | POA: Diagnosis not present

## 2014-09-27 ENCOUNTER — Ambulatory Visit (INDEPENDENT_AMBULATORY_CARE_PROVIDER_SITE_OTHER): Payer: Medicare Other | Admitting: Adult Health

## 2014-09-27 ENCOUNTER — Encounter: Payer: Self-pay | Admitting: Adult Health

## 2014-09-27 ENCOUNTER — Inpatient Hospital Stay: Payer: Medicare Other | Admitting: Adult Health

## 2014-09-27 VITALS — BP 150/70 | HR 57 | Temp 98.3°F | Ht 62.0 in | Wt 253.0 lb

## 2014-09-27 DIAGNOSIS — I6523 Occlusion and stenosis of bilateral carotid arteries: Secondary | ICD-10-CM

## 2014-09-27 DIAGNOSIS — J9621 Acute and chronic respiratory failure with hypoxia: Secondary | ICD-10-CM | POA: Diagnosis not present

## 2014-09-27 NOTE — Patient Instructions (Signed)
Continue on Oxygen 2l/m with activity and At bedtime   Call back if you change your mind for sleep study .  Follow up Dr. Elsworth Soho  In 4 weeks with PFT  Please contact office for sooner follow up if symptoms do not improve or worsen or seek emergency care

## 2014-10-03 NOTE — Progress Notes (Signed)
   Subjective:    Patient ID: [name] [name], female    DOB: 30-Aug-1935, 79 y.o.   MRN: [id number]  HPI 79 year old female w/ sig h/o obesity, DM, and HTN who underwent left CEA on 09/12/14 for asymptomatic 80% carotid stenosis. Had post op hypoxia , PCCM consulted   09/27/14 Post hospital follow up  Admitted 2/18 to 09/14/14 for elective Left CEA for asymptomatic 80% carotid stenosis .  Had post op hypoxia suspected to be OSA /OHS  Started on o2 at discharged Recommended to have PFT and sleep study  Has not smoked since discharge.  Says she is doing okay overall.  No chest pain, orthopnea, or increased edema  She declines sleep study.     Review of Systems Constitutional:   No  weight loss, night sweats,  Fevers, chills,  +fatigue, or  lassitude.  HEENT:   No headaches,  Difficulty swallowing,  Tooth/dental problems, or  Sore throat,                No sneezing, itching, ear ache, nasal congestion, post nasal drip,   CV:  No chest pain,  Orthopnea, PND, swelling in lower extremities, anasarca, dizziness, palpitations, syncope.   GI  No heartburn, indigestion, abdominal pain, nausea, vomiting, diarrhea, change in bowel habits, loss of appetite, bloody stools.   Resp:   No chest wall deformity  Skin: no rash or lesions.  GU: no dysuria, change in color of urine, no urgency or frequency.  No flank pain, no hematuria   MS:  No joint pain or swelling.  No decreased range of motion.  No back pain.  Psych:  No change in mood or affect. No depression or anxiety.  No memory loss.         Objective:   Physical Exam  GEN: A/Ox3; pleasant , NAD    HEENT:  [place]/AT,  EACs-clear, TMs-wnl, NOSE-clear, THROAT-clear, no lesions, no postnasal drip or exudate noted.   NECK:  Supple w/ fair ROM; no JVD; normal carotid impulses w/o bruits; no thyromegaly or nodules palpated; no lymphadenopathy.  RESP  Decreased BS in bases no accessory muscle use, no dullness to percussion  CARD:  RRR, no  m/r/g  , tr  peripheral edema, pulses intact, no cyanosis or clubbing.  GI:   Soft & nt; nml bowel sounds; no organomegaly or masses detected.  Musco: Warm bil, no deformities or joint swelling noted.   Neuro: alert, no focal deficits noted.    Skin: Warm, no lesions or rashes          Assessment & Plan:

## 2014-10-03 NOTE — Assessment & Plan Note (Signed)
Recent flare now resolving  Needs PFT  Discussed sleep study-pt declines    Plan  Continue on Oxygen 2l/m with activity and At bedtime   Call back if you change your mind for sleep study .  Follow up Dr. Elsworth Soho  In 4 weeks with PFT  Please contact office for sooner follow up if symptoms do not improve or worsen or seek emergency care

## 2014-10-04 DIAGNOSIS — I739 Peripheral vascular disease, unspecified: Secondary | ICD-10-CM | POA: Diagnosis not present

## 2014-10-04 DIAGNOSIS — Z48812 Encounter for surgical aftercare following surgery on the circulatory system: Secondary | ICD-10-CM | POA: Diagnosis not present

## 2014-10-04 DIAGNOSIS — I6521 Occlusion and stenosis of right carotid artery: Secondary | ICD-10-CM | POA: Diagnosis not present

## 2014-10-04 DIAGNOSIS — I129 Hypertensive chronic kidney disease with stage 1 through stage 4 chronic kidney disease, or unspecified chronic kidney disease: Secondary | ICD-10-CM | POA: Diagnosis not present

## 2014-10-04 DIAGNOSIS — I2510 Atherosclerotic heart disease of native coronary artery without angina pectoris: Secondary | ICD-10-CM | POA: Diagnosis not present

## 2014-10-04 DIAGNOSIS — E119 Type 2 diabetes mellitus without complications: Secondary | ICD-10-CM | POA: Diagnosis not present

## 2014-10-14 DIAGNOSIS — I6521 Occlusion and stenosis of right carotid artery: Secondary | ICD-10-CM | POA: Diagnosis not present

## 2014-10-14 DIAGNOSIS — E119 Type 2 diabetes mellitus without complications: Secondary | ICD-10-CM | POA: Diagnosis not present

## 2014-10-14 DIAGNOSIS — I739 Peripheral vascular disease, unspecified: Secondary | ICD-10-CM | POA: Diagnosis not present

## 2014-10-14 DIAGNOSIS — Z48812 Encounter for surgical aftercare following surgery on the circulatory system: Secondary | ICD-10-CM | POA: Diagnosis not present

## 2014-10-14 DIAGNOSIS — I129 Hypertensive chronic kidney disease with stage 1 through stage 4 chronic kidney disease, or unspecified chronic kidney disease: Secondary | ICD-10-CM | POA: Diagnosis not present

## 2014-10-14 DIAGNOSIS — I2510 Atherosclerotic heart disease of native coronary artery without angina pectoris: Secondary | ICD-10-CM | POA: Diagnosis not present

## 2014-10-24 ENCOUNTER — Other Ambulatory Visit: Payer: Self-pay | Admitting: Nurse Practitioner

## 2014-10-25 ENCOUNTER — Telehealth: Payer: Self-pay | Admitting: Family Medicine

## 2014-10-28 ENCOUNTER — Other Ambulatory Visit: Payer: Self-pay

## 2014-10-28 MED ORDER — GLIMEPIRIDE 2 MG PO TABS: ORAL_TABLET | ORAL | Status: DC

## 2014-10-28 NOTE — Telephone Encounter (Signed)
Do not see on med list? 

## 2014-10-29 ENCOUNTER — Telehealth: Payer: Self-pay

## 2014-10-29 MED ORDER — LINAGLIPTIN 5 MG PO TABS: 5.0000 mg | ORAL_TABLET | Freq: Every day | ORAL | Status: DC

## 2014-10-29 NOTE — Telephone Encounter (Signed)
Tradjenta 5 mg approved until 07/26/15  Case # [place] [id number]

## 2014-10-29 NOTE — Telephone Encounter (Signed)
Spoke with patient.  She needs Tradjenta refilled. Sent to pharmacy. Patient aware.

## 2014-10-31 DIAGNOSIS — I129 Hypertensive chronic kidney disease with stage 1 through stage 4 chronic kidney disease, or unspecified chronic kidney disease: Secondary | ICD-10-CM | POA: Diagnosis not present

## 2014-10-31 DIAGNOSIS — I739 Peripheral vascular disease, unspecified: Secondary | ICD-10-CM | POA: Diagnosis not present

## 2014-10-31 DIAGNOSIS — I6521 Occlusion and stenosis of right carotid artery: Secondary | ICD-10-CM | POA: Diagnosis not present

## 2014-10-31 DIAGNOSIS — Z48812 Encounter for surgical aftercare following surgery on the circulatory system: Secondary | ICD-10-CM | POA: Diagnosis not present

## 2014-10-31 DIAGNOSIS — I2510 Atherosclerotic heart disease of native coronary artery without angina pectoris: Secondary | ICD-10-CM | POA: Diagnosis not present

## 2014-10-31 DIAGNOSIS — E119 Type 2 diabetes mellitus without complications: Secondary | ICD-10-CM | POA: Diagnosis not present

## 2014-11-01 ENCOUNTER — Other Ambulatory Visit: Payer: Self-pay | Admitting: Pulmonary Disease

## 2014-11-01 DIAGNOSIS — R0600 Dyspnea, unspecified: Secondary | ICD-10-CM

## 2014-11-04 ENCOUNTER — Ambulatory Visit (INDEPENDENT_AMBULATORY_CARE_PROVIDER_SITE_OTHER): Payer: Medicare Other | Admitting: Pulmonary Disease

## 2014-11-04 ENCOUNTER — Encounter: Payer: Self-pay | Admitting: Pulmonary Disease

## 2014-11-04 ENCOUNTER — Ambulatory Visit (INDEPENDENT_AMBULATORY_CARE_PROVIDER_SITE_OTHER)
Admission: RE | Admit: 2014-11-04 | Discharge: 2014-11-04 | Disposition: A | Discharge status: Home / Self Care | Payer: Medicare Other | Source: Ambulatory Visit | Attending: Pulmonary Disease | Admitting: Pulmonary Disease

## 2014-11-04 VITALS — BP 134/82 | HR 80 | Ht 61.0 in | Wt 255.0 lb

## 2014-11-04 DIAGNOSIS — J849 Interstitial pulmonary disease, unspecified: Secondary | ICD-10-CM | POA: Diagnosis not present

## 2014-11-04 DIAGNOSIS — R0902 Hypoxemia: Secondary | ICD-10-CM

## 2014-11-04 DIAGNOSIS — J84112 Idiopathic pulmonary fibrosis: Secondary | ICD-10-CM | POA: Diagnosis not present

## 2014-11-04 DIAGNOSIS — R0600 Dyspnea, unspecified: Secondary | ICD-10-CM

## 2014-11-04 DIAGNOSIS — I6523 Occlusion and stenosis of bilateral carotid arteries: Secondary | ICD-10-CM

## 2014-11-04 DIAGNOSIS — R0602 Shortness of breath: Secondary | ICD-10-CM | POA: Diagnosis not present

## 2014-11-04 DIAGNOSIS — J984 Other disorders of lung: Secondary | ICD-10-CM | POA: Diagnosis not present

## 2014-11-04 LAB — PULMONARY FUNCTION TEST
DL/VA % PRED: 86 %
DL/VA: 3.78 ml/min/mmHg/L
DLCO UNC % PRED: 62 %
DLCO UNC: 12.62 ml/min/mmHg
FEF 25-75 POST: 2.23 L/s
FEF 25-75 Pre: 1.64 L/sec
FEF2575-%Change-Post: 35 %
FEF2575-%PRED-POST: 156 %
FEF2575-%Pred-Pre: 115 %
FEV1-%CHANGE-POST: 7 %
FEV1-%PRED-PRE: 84 %
FEV1-%Pred-Post: 91 %
FEV1-POST: 1.63 L
FEV1-Pre: 1.52 L
FEV1FVC-%CHANGE-POST: 0 %
FEV1FVC-%Pred-Pre: 109 %
FEV6-%CHANGE-POST: 8 %
FEV6-%Pred-Post: 88 %
FEV6-%Pred-Pre: 81 %
FEV6-Post: 2.01 L
FEV6-Pre: 1.85 L
FEV6FVC-%Pred-Post: 105 %
FEV6FVC-%Pred-Pre: 105 %
FVC-%Change-Post: 8 %
FVC-%PRED-POST: 83 %
FVC-%Pred-Pre: 77 %
FVC-Post: 2.01 L
FVC-Pre: 1.85 L
POST FEV1/FVC RATIO: 81 %
POST FEV6/FVC RATIO: 100 %
Pre FEV1/FVC ratio: 82 %
Pre FEV6/FVC Ratio: 100 %
RV % PRED: 75 %
RV: 1.64 L
TLC % pred: 78 %
TLC: 3.62 L

## 2014-11-04 NOTE — Assessment & Plan Note (Addendum)
Interestingly she does not have significant obstruction on PFTs in spite of her long history of smoking. She does have bibasal crackles, but high-resolution CT scan does not show interstitial lung disease. The cause of her hypoxia, with seems to be improved compared to her immediate postoperative status is therefore unclear. I do not think we have to consider pulmonary embolism here. She appears euvolemic and does not grossly fluid overloaded. She possibly has OSA, but does not want to go ahead with a sleep study. She would definitely not want to be on CPAP therapy-hence I will not even pursue a home sleep test here. I do not feel bronchodilators are indicated here. Should probably focus on her mild diastolic dysfunction, if she has weight gain would consider a trial of diuretics

## 2014-11-04 NOTE — Progress Notes (Signed)
PFT done today. 

## 2014-11-04 NOTE — Patient Instructions (Signed)
Oxygen testing  CT scan of chest to test for scar tissue in your lungs You have to QUIT smoking !!

## 2014-11-04 NOTE — Progress Notes (Signed)
   Subjective:    Patient ID: [name], female    DOB: [birth year], 79 y.o.   MRN: [id number]  HPI  79 year old female w/ sig h/o obesity, DM, and HTN who underwent left CEA on 09/12/14 for asymptomatic 80% carotid stenosis. Had post op hypoxia , PCCM consulted   Felt Acute worsening definite possibility/likely - acute on chronic mild diast dysfn - post op atelectasis - seen on CXR  11/04/2014  Chief Complaint  Patient presents with  . Sleep Consult    Referred by Dr. Doren Custard.  does not want to do sleep study.  when she went to sleep for surgery, she was hard to wake up.   Epworth Score: 4; PFT done today.   Started on o2 at discharge Back to smoking 5-10 cigarettes a day  Says she is doing okay overall. Uses oxygen in the daytime No chest pain, orthopnea, or increased edema  She declines sleep study.  She desaturated to 91% on walking, but was limited due to hip and leg pain PFTs no obstruction, FVC 77%, TLC 78%, DLCO 62% HRCT chest 10/2014 >> no ILD, Scattered pulmonary nodular densities measure up to 4 mm in RLL  Med review shows lisinopril  Past Medical History  Diagnosis Date  . Bruit   . Hyperlipidemia   . HTN (hypertension)   . CAD   . PVD   . VENOUS INSUFFICIENCY   . DM   . OBESITY   . Edema   . PONV (postoperative nausea and vomiting)   . Angina 1995  . Carotid artery occlusion   . Myocardial infarction 1995  . Shortness of breath dyspnea   . Anxiety   . Depression   . Cough      Review of Systems  Constitutional: Negative for fever, chills and unexpected weight change.  HENT: Negative for congestion, dental problem, ear pain, nosebleeds, postnasal drip, rhinorrhea, sinus pressure, sneezing, sore throat, trouble swallowing and voice change.   Eyes: Negative for visual disturbance.  Respiratory: Negative for cough, choking and shortness of breath.   Cardiovascular: Negative for chest pain and leg swelling.  Gastrointestinal:  Negative for vomiting, abdominal pain and diarrhea.  Genitourinary: Negative for difficulty urinating.  Musculoskeletal: Negative for arthralgias.  Skin: Negative for rash.  Neurological: Negative for tremors, syncope and headaches.  Hematological: Does not bruise/bleed easily.       Objective:   Physical Exam  Gen. Pleasant, well-nourished, in no distress, normal affect ENT - no lesions, no post nasal drip Neck: No JVD, no thyromegaly, no carotid bruits Lungs: no use of accessory muscles, no dullness to percussion, bibasal rales, no rhonchi  Cardiovascular: Rhythm regular, heart sounds  normal, no murmurs or gallops, no peripheral edema Abdomen: soft and non-tender, no hepatosplenomegaly, BS normal. Musculoskeletal: No deformities, no cyanosis or clubbing Neuro:  alert, non focal       Assessment & Plan:

## 2014-11-06 ENCOUNTER — Telehealth: Payer: Self-pay | Admitting: Pulmonary Disease

## 2014-11-06 NOTE — Telephone Encounter (Signed)
ONO on RA 

## 2014-11-06 NOTE — Telephone Encounter (Signed)
Spoke with Melissa at [facility], needs to know if ONO needs to be on room air on on 02, and if it is on 02 how many liters?  RA please advise.  Thanks!

## 2014-11-07 NOTE — Telephone Encounter (Signed)
Spoke with [facility], verbal order given for ONO to be on RA. Nothing further needed.

## 2014-11-14 DIAGNOSIS — I739 Peripheral vascular disease, unspecified: Secondary | ICD-10-CM | POA: Diagnosis not present

## 2014-11-14 DIAGNOSIS — Z48812 Encounter for surgical aftercare following surgery on the circulatory system: Secondary | ICD-10-CM | POA: Diagnosis not present

## 2014-11-14 DIAGNOSIS — E119 Type 2 diabetes mellitus without complications: Secondary | ICD-10-CM | POA: Diagnosis not present

## 2014-11-14 DIAGNOSIS — I2510 Atherosclerotic heart disease of native coronary artery without angina pectoris: Secondary | ICD-10-CM | POA: Diagnosis not present

## 2014-11-14 DIAGNOSIS — I129 Hypertensive chronic kidney disease with stage 1 through stage 4 chronic kidney disease, or unspecified chronic kidney disease: Secondary | ICD-10-CM | POA: Diagnosis not present

## 2014-11-14 DIAGNOSIS — I6521 Occlusion and stenosis of right carotid artery: Secondary | ICD-10-CM | POA: Diagnosis not present

## 2015-02-17 ENCOUNTER — Other Ambulatory Visit: Payer: Self-pay | Admitting: *Deleted

## 2015-02-17 MED ORDER — GLIMEPIRIDE 2 MG PO TABS: ORAL_TABLET | ORAL | Status: DC

## 2015-02-22 ENCOUNTER — Other Ambulatory Visit: Payer: Self-pay | Admitting: Cardiovascular Disease

## 2015-03-03 ENCOUNTER — Telehealth: Payer: Self-pay | Admitting: Cardiovascular Disease

## 2015-03-03 DIAGNOSIS — I6523 Occlusion and stenosis of bilateral carotid arteries: Secondary | ICD-10-CM

## 2015-03-03 NOTE — Telephone Encounter (Signed)
New Message      Pt calling wnating to know when she needs to see Dr. Johnsie Cancel again and gets tests done. No recall in Epic. Please call back and advise.

## 2015-03-03 NOTE — Telephone Encounter (Signed)
Scheduled follow up appointment for patient  Order placed for follow up carotid duplex, sent to [facility] for scheduling

## 2015-03-24 ENCOUNTER — Other Ambulatory Visit: Payer: Self-pay | Admitting: Cardiovascular Disease

## 2015-04-05 ENCOUNTER — Other Ambulatory Visit: Payer: Self-pay | Admitting: Cardiovascular Disease

## 2015-04-20 ENCOUNTER — Other Ambulatory Visit: Payer: Self-pay | Admitting: Cardiovascular Disease

## 2015-04-21 NOTE — Telephone Encounter (Signed)
Josue Hector, MD at 08/19/2014 9:13 AM  clopidogrel (PLAVIX) 75 MG tabletTake 1 tablet (75 mg total) by mouth daily Your physician has recommended you make the following change in your medication: HOLD FUROSEMIDE SAT AND SUN FOR LABS ON Monday

## 2015-04-22 ENCOUNTER — Encounter: Payer: Self-pay | Admitting: *Deleted

## 2015-04-23 NOTE — Progress Notes (Signed)
Patient ID: [name] [name], female   DOB: [birth year], 79 y.o.   MRN: [id number] [name] [name] returns today for followup. She has had previous stenting of the right coronary artery. She has had resting tachycardia. We started her on beta-blockers and she improved. We did not find a good reason forher tachycardia, although she is diabetic and may have some autonomic dysfunction. She has good LV function. She has bilateral carotid disease. 08/2014 had R CEA by Dr Doren Custard complicated by hypoxia.  Residual 60-79% left disease. Seen by Dr Elsworth Soho etiology of post op hypoxia unclear.  Does not want sleep study. CT no ILD.  No obstruction on PFT;s despite smoking history .  He thought trial diuretics in order for history of diastolic dysfunction even though euvolemic on exam.    She has smoked since age 54. She is not motivated to quit. I counseled her for less than 10 minutes She had previously tried patches 2 years agao with limited success and they were too expensive. Willing to try Welbutrin but not Chantix . Her BS has been reasonably controlled despite a poor diet.   Echo 5/12 normal EF 55% mild LVH  CXR 12/14 NAD   No complaints says breathing better had duplex today   ROS: Denies fever, malais, weight loss, blurry vision, decreased visual acuity, cough, sputum, SOB, hemoptysis, pleuritic pain, palpitaitons, heartburn, abdominal pain, melena, lower extremity edema, claudication, or rash.  All other systems reviewed and negative  General: Affect appropriate Obese white female  HEENT: normal Neck supple with no adenopathy JVP normalbilateral bruits no thyromegaly Lungs clear with no wheezing and good diaphragmatic motion Heart:  S1/S2 no murmur, no rub, gallop or click PMI normal Abdomen: benighn, BS positve, no tenderness, no AAA no bruit.  No HSM or HJR Distal pulses intact with no bruits No edema Neuro non-focal Skin warm and dry No muscular weakness   Current Outpatient Prescriptions  Medication Sig  Dispense Refill  . atorvastatin (LIPITOR) 80 MG tablet TAKE 1 TABLET (80 MG TOTAL) BY MOUTH DAILY. 90 tablet 1  . cilostazol (PLETAL) 100 MG tablet TAKE 1 TABLET (100 MG TOTAL) BY MOUTH 2 (TWO) TIMES DAILY. 180 tablet 1  . clopidogrel (PLAVIX) 75 MG tablet TAKE 1 TABLET (75 MG TOTAL) BY MOUTH DAILY. 90 tablet 0  . diclofenac sodium (VOLTAREN) 1 % GEL Apply 2 g topically daily as needed. For pain    . glimepiride (AMARYL) 2 MG tablet TAKE 1 TABLET (2 MG TOTAL) BY MOUTH DAILY BEFORE BREAKFAST. 90 tablet 0  . linagliptin (TRADJENTA) 5 MG TABS tablet Take 1 tablet (5 mg total) by mouth daily. 90 tablet 1  . LORazepam (ATIVAN) 0.5 MG tablet Take 1 tablet (0.5 mg total) by mouth 2 (two) times daily as needed for anxiety. 30 tablet 1  . metoprolol succinate (TOPROL-XL) 50 MG 24 hr tablet TAKE 1 AND 1/2 TABLETS BY MOUTH 2 (TWO) TIMES DAILY. 270 tablet 0  . nitroGLYCERIN (NITROSTAT) 0.4 MG SL tablet Place 0.4 mg under the tongue every 5 (five) minutes as needed for chest pain (3 doses MAX).    Marland Kitchen ONETOUCH DELICA LANCETS FINE MISC 1 each by Does not apply route 2 (two) times daily. 100 each 5  . furosemide (LASIX) 20 MG tablet Take 1 tablet (20 mg total) by mouth daily. 90 tablet 3  . losartan (COZAAR) 100 MG tablet Take 1 tablet (100 mg total) by mouth daily. 90 tablet 3   No current facility-administered medications for this visit.  Allergies  Review of patient's allergies indicates no known allergies.  Electrocardiogram: 06/19/13  SR rate 70 normal ECG  08/20/14 Sr rate 81 normal  Assessment and Plan Carotid:  Post L CEA f/u [facility] Dr Scot Dock residual right  37-48% stenosis Diastolic Dysfunction:  2/70/78 echo reviewed EF 60-65%  Grade one diastolic dysfunction  Will stop lisinopril /HCTZ And change to Cozaar 100 and lasix 20 mg BMET in 4 weeks Hypoxia: improved still smoking counseled  Not wearing oxygen resting sats normal HTN: see med changes above f/u 3 months  DM: Discussed low carb diet.   Target hemoglobin A1c is 6.5 or less.  Continue current medications. Chol:  Lab Results  Component Value Date   LDLCALC 61 08/27/2014     Cozaar 100 Laisix 20 mg Carotid 6 months F/U Scot Dock F/U me 3 months  BMET 4 weeks  [name] [name]

## 2015-04-24 ENCOUNTER — Encounter: Payer: Self-pay | Admitting: Cardiovascular Disease

## 2015-04-24 ENCOUNTER — Ambulatory Visit (HOSPITAL_COMMUNITY)
Admission: RE | Admit: 2015-04-24 | Discharge: 2015-04-24 | Disposition: A | Discharge status: Home / Self Care | Payer: Medicare Other | Source: Ambulatory Visit | Attending: Cardiovascular Disease | Admitting: Cardiovascular Disease

## 2015-04-24 ENCOUNTER — Ambulatory Visit (INDEPENDENT_AMBULATORY_CARE_PROVIDER_SITE_OTHER): Payer: Medicare Other | Admitting: Cardiovascular Disease

## 2015-04-24 VITALS — BP 164/78 | HR 80 | Ht 60.0 in | Wt 256.0 lb

## 2015-04-24 DIAGNOSIS — E119 Type 2 diabetes mellitus without complications: Secondary | ICD-10-CM | POA: Insufficient documentation

## 2015-04-24 DIAGNOSIS — I10 Essential (primary) hypertension: Secondary | ICD-10-CM | POA: Insufficient documentation

## 2015-04-24 DIAGNOSIS — I6529 Occlusion and stenosis of unspecified carotid artery: Secondary | ICD-10-CM

## 2015-04-24 DIAGNOSIS — F17200 Nicotine dependence, unspecified, uncomplicated: Secondary | ICD-10-CM | POA: Diagnosis not present

## 2015-04-24 DIAGNOSIS — I2510 Atherosclerotic heart disease of native coronary artery without angina pectoris: Secondary | ICD-10-CM | POA: Insufficient documentation

## 2015-04-24 DIAGNOSIS — I6523 Occlusion and stenosis of bilateral carotid arteries: Secondary | ICD-10-CM | POA: Diagnosis not present

## 2015-04-24 DIAGNOSIS — Z79899 Other long term (current) drug therapy: Secondary | ICD-10-CM

## 2015-04-24 DIAGNOSIS — E785 Hyperlipidemia, unspecified: Secondary | ICD-10-CM | POA: Insufficient documentation

## 2015-04-24 DIAGNOSIS — I739 Peripheral vascular disease, unspecified: Secondary | ICD-10-CM | POA: Insufficient documentation

## 2015-04-24 MED ORDER — FUROSEMIDE 20 MG PO TABS: 20.0000 mg | ORAL_TABLET | Freq: Every day | ORAL | Status: DC

## 2015-04-24 MED ORDER — LOSARTAN POTASSIUM 100 MG PO TABS: 100.0000 mg | ORAL_TABLET | Freq: Every day | ORAL | Status: DC

## 2015-04-24 NOTE — Patient Instructions (Addendum)
Medication Instructions:  STOP LISINOPRIL/HCTZ START  LOSARTAN  100 MG  EVERY DAY  FUROSEMIDE  20 MG  EVERY DAY   Labwork: BMET  IN  4  WEEKS  October   27    Testing/Procedures: NONE  Follow-Up: Your physician recommends that you schedule a follow-up appointment in: 3 MONTHS  WITH  DR  Johnsie Cancel   Any Other Special Instructions Will Be Listed Below (If Applicable).

## 2015-05-12 ENCOUNTER — Telehealth: Payer: Self-pay | Admitting: Family Medicine

## 2015-05-14 ENCOUNTER — Other Ambulatory Visit: Payer: Self-pay

## 2015-05-14 MED ORDER — GLIMEPIRIDE 2 MG PO TABS: ORAL_TABLET | ORAL | Status: DC

## 2015-05-14 MED ORDER — LINAGLIPTIN 5 MG PO TABS: 5.0000 mg | ORAL_TABLET | Freq: Every day | ORAL | Status: DC

## 2015-05-14 NOTE — Telephone Encounter (Signed)
Last seen 08/27/14  Dr Sabra Heck

## 2015-05-14 NOTE — Telephone Encounter (Signed)
Last seen 08/2014

## 2015-05-16 ENCOUNTER — Other Ambulatory Visit: Payer: Self-pay | Admitting: *Deleted

## 2015-05-16 MED ORDER — ONETOUCH DELICA LANCETS FINE MISC: Status: DC

## 2015-05-16 MED ORDER — GLUCOSE BLOOD VI STRP: ORAL_STRIP | Status: DC

## 2015-05-16 NOTE — Telephone Encounter (Signed)
done

## 2015-05-22 ENCOUNTER — Other Ambulatory Visit (INDEPENDENT_AMBULATORY_CARE_PROVIDER_SITE_OTHER): Payer: Medicare Other | Admitting: *Deleted

## 2015-05-22 DIAGNOSIS — Z79899 Other long term (current) drug therapy: Secondary | ICD-10-CM

## 2015-05-22 LAB — BASIC METABOLIC PANEL
BUN: 21 mg/dL (ref 7–25)
CO2: 24 mmol/L (ref 20–31)
Calcium: 8.6 mg/dL (ref 8.6–10.4)
Chloride: 103 mmol/L (ref 98–110)
Creat: 1.6 mg/dL — ABNORMAL HIGH (ref 0.60–0.93)
GLUCOSE: 179 mg/dL — AB (ref 65–99)
Potassium: 4.6 mmol/L (ref 3.5–5.3)
Sodium: 140 mmol/L (ref 135–146)

## 2015-05-22 NOTE — Addendum Note (Signed)
Addended by: Eulis Foster on: 05/22/2015 12:48 PM   Modules accepted: Orders

## 2015-05-27 ENCOUNTER — Telehealth: Payer: Self-pay | Admitting: Family Medicine

## 2015-06-25 ENCOUNTER — Other Ambulatory Visit: Payer: Self-pay | Admitting: Cardiovascular Disease

## 2015-06-26 NOTE — Telephone Encounter (Signed)
Lipids checked by pcp. Ok to refill or send to their office? Please advise. Thanks, MI

## 2015-06-30 NOTE — Progress Notes (Signed)
Patient ID: [name] [name], female   DOB: Feb 09, 1936, 79 y.o.   MRN: [id number] [name] [name] returns today for followup. She has had previous stenting of the right coronary artery. She has had resting tachycardia. We started her on beta-blockers and she improved. We did not find a good reason forher tachycardia, although she is diabetic and may have some autonomic dysfunction. She has good LV function. She has bilateral carotid disease. 08/2014 had R CEA by Dr Doren Custard complicated by hypoxia.  Residual 60-79% left disease. Seen by Dr Elsworth Soho etiology of post op hypoxia unclear.  Does not want sleep study. CT no ILD.  No obstruction on PFT;s despite smoking history .  He thought trial diuretics in order for history of diastolic dysfunction even though euvolemic on exam.    She has smoked since age 71. She is not motivated to quit. I counseled her for less than 10 minutes She had previously tried patches 2 years agao with limited success and they were too expensive. Willing to try Welbutrin but not Chantix . Her BS has been reasonably controlled despite a poor diet.   Echo 5/12 normal EF 55% mild LVH  CXR 12/14 NAD   No complaints says breathing better had duplex today   ROS: Denies fever, malais, weight loss, blurry vision, decreased visual acuity, cough, sputum, SOB, hemoptysis, pleuritic pain, palpitaitons, heartburn, abdominal pain, melena, lower extremity edema, claudication, or rash.  All other systems reviewed and negative  General: Affect appropriate Obese white female  HEENT: normal Neck supple with no adenopathy JVP normalbilateral bruits no thyromegaly Lungs clear with no wheezing and good diaphragmatic motion Heart:  S1/S2 no murmur, no rub, gallop or click PMI normal Abdomen: benighn, BS positve, no tenderness, no AAA no bruit.  No HSM or HJR Distal pulses intact with no bruits No edema Neuro non-focal Skin warm and dry No muscular weakness   Current Outpatient Prescriptions  Medication Sig  Dispense Refill  . atorvastatin (LIPITOR) 80 MG tablet TAKE 1 TABLET (80 MG TOTAL) BY MOUTH DAILY. 90 tablet 3  . cilostazol (PLETAL) 100 MG tablet TAKE 1 TABLET (100 MG TOTAL) BY MOUTH 2 (TWO) TIMES DAILY. 180 tablet 1  . clopidogrel (PLAVIX) 75 MG tablet TAKE 1 TABLET (75 MG TOTAL) BY MOUTH DAILY. 90 tablet 0  . diclofenac sodium (VOLTAREN) 1 % GEL Apply 2 g topically daily as needed. For pain    . furosemide (LASIX) 20 MG tablet Take 1 tablet (20 mg total) by mouth daily. 90 tablet 3  . glimepiride (AMARYL) 2 MG tablet TAKE 1 TABLET (2 MG TOTAL) BY MOUTH DAILY BEFORE BREAKFAST. 90 tablet 0  . glucose blood test strip Test bid. DX E13.10 100 each 0  . linagliptin (TRADJENTA) 5 MG TABS tablet Take 1 tablet (5 mg total) by mouth daily. 90 tablet 0  . LORazepam (ATIVAN) 0.5 MG tablet Take 1 tablet (0.5 mg total) by mouth 2 (two) times daily as needed for anxiety. 30 tablet 1  . losartan (COZAAR) 100 MG tablet Take 1 tablet (100 mg total) by mouth daily. 90 tablet 3  . metoprolol succinate (TOPROL-XL) 50 MG 24 hr tablet TAKE 1 AND 1/2 TABLETS BY MOUTH 2 (TWO) TIMES DAILY. 270 tablet 0  . nitroGLYCERIN (NITROSTAT) 0.4 MG SL tablet Place 0.4 mg under the tongue every 5 (five) minutes as needed for chest pain (3 doses MAX).    Marland Kitchen ONETOUCH DELICA LANCETS FINE MISC Test blood sugar bid. DX E13.10 100 each 0  No current facility-administered medications for this visit.    Allergies  Review of patient's allergies indicates no known allergies.  Electrocardiogram: 06/19/13  SR rate 70 normal ECG  08/20/14 Sr rate 81 normal  Assessment and Plan Carotid:  Post L CEA f/u [facility] Dr Scot Dock residual right  A999333 stenosis Diastolic Dysfunction:  [id number] echo reviewed EF 60-65%  Grade one diastolic dysfunction  contnue ARB and Lasix Hypoxia: improved still smoking counseled  Not wearing oxygen resting sats normal HTN: see med changes above f/u 6 months  DM: Discussed low carb diet.  Target hemoglobin A1c is  6.5 or less.  Continue current medications. Chol:  Lab Results  Component Value Date   LDLCALC 61 08/27/2014     Cozaar 100 Laisix 20 mg continue  Carotid 6 months F/U Scot Dock F/U me 6 months    Jenkins Rouge

## 2015-07-02 ENCOUNTER — Other Ambulatory Visit: Payer: Self-pay | Admitting: Cardiovascular Disease

## 2015-07-03 ENCOUNTER — Other Ambulatory Visit: Payer: Self-pay | Admitting: Cardiovascular Disease

## 2015-07-03 ENCOUNTER — Encounter: Payer: Medicare Other | Admitting: Cardiovascular Disease

## 2015-07-03 ENCOUNTER — Ambulatory Visit: Payer: Medicare Other | Admitting: Cardiovascular Disease

## 2015-07-10 DIAGNOSIS — D3132 Benign neoplasm of left choroid: Secondary | ICD-10-CM | POA: Diagnosis not present

## 2015-07-10 LAB — HM DIABETES EYE EXAM

## 2015-07-14 ENCOUNTER — Ambulatory Visit: Payer: Medicare Other | Admitting: Cardiovascular Disease

## 2015-07-15 ENCOUNTER — Encounter: Payer: Self-pay | Admitting: *Deleted

## 2015-07-28 DIAGNOSIS — H26491 Other secondary cataract, right eye: Secondary | ICD-10-CM | POA: Diagnosis not present

## 2015-07-28 DIAGNOSIS — H26492 Other secondary cataract, left eye: Secondary | ICD-10-CM | POA: Diagnosis not present

## 2015-08-04 ENCOUNTER — Encounter (HOSPITAL_COMMUNITY): Payer: Medicare Other

## 2015-08-07 ENCOUNTER — Ambulatory Visit: Payer: Medicare Other

## 2015-08-10 ENCOUNTER — Other Ambulatory Visit: Payer: Self-pay | Admitting: Cardiovascular Disease

## 2015-08-14 ENCOUNTER — Other Ambulatory Visit: Payer: Self-pay

## 2015-08-14 ENCOUNTER — Other Ambulatory Visit: Payer: Self-pay | Admitting: Nurse Practitioner

## 2015-08-14 ENCOUNTER — Telehealth: Payer: Self-pay | Admitting: Family Medicine

## 2015-08-14 MED ORDER — LINAGLIPTIN 5 MG PO TABS: 5.0000 mg | ORAL_TABLET | Freq: Every day | ORAL | Status: DC

## 2015-08-14 MED ORDER — GLIMEPIRIDE 2 MG PO TABS: ORAL_TABLET | ORAL | Status: DC

## 2015-08-14 NOTE — Telephone Encounter (Signed)
Would refill for one month but has not been here in one year and needs office visit

## 2015-08-14 NOTE — Telephone Encounter (Signed)
Last seen 08/27/14  Dr Miller 

## 2015-08-15 MED ORDER — SITAGLIPTIN PHOSPHATE 50 MG PO TABS: 50.0000 mg | ORAL_TABLET | Freq: Every day | ORAL | Status: DC

## 2015-08-15 NOTE — Telephone Encounter (Signed)
50 mg Januvia. Renal function is not quite up to par so have to reduce dose

## 2015-08-15 NOTE — Telephone Encounter (Signed)
Pt aware januvia called in instead of tradjenta.

## 2015-08-22 NOTE — Progress Notes (Signed)
Patient ID: [name] [name], female   DOB: 1935-08-18, 80 y.o.   MRN: [id number]   [name] [name] returns today for followup. She has had previous stenting of the right coronary artery. She has had resting tachycardia. We started her on beta-blockers and she improved. We did not find a good reason forher tachycardia, although she is diabetic and may have some autonomic dysfunction. She has good LV function. She has bilateral carotid disease. 08/2014 had R CEA by Dr Doren Custard complicated by hypoxia.  Residual 60-79% left disease. Seen by Dr Elsworth Soho etiology of post op hypoxia unclear.  Does not want sleep study. CT no ILD.  No obstruction on PFT;s despite smoking history .  He thought trial diuretics in order for history of diastolic dysfunction even though euvolemic on exam.    She has smoked since age 47. She is not motivated to quit. I counseled her for less than 10 minutes She had previously tried patches 2 years agao with limited success and they were too expensive. Willing to try Welbutrin but not Chantix . Her BS has been reasonably controlled despite a poor diet.   Echo 12/04/12  normal EF 55% mild LVH  CXR 07/04/13 NAD  Carotid [id number]  A999333 LICA  F/u due March 0000000   No complaints says breathing better   CT 11/04/14 no ILD some nodules  IMPRESSION: 1. No evidence of interstitial lung disease. No acute findings to explain the patient's worsening shortness of breath. 2. Coronary artery calcification. 3. Scattered pulmonary nodular densities measure up to 4 mm. If the patient is at high risk for bronchogenic carcinoma, follow-up chest CT at 1 year is recommended. If the patient is at low risk, no follow-up is needed. This recommendation follows the consensus statement: Guidelines for Management of Small Pulmonary Nodules Detected on CT Scans: A Statement from the [employer] as published in Radiology 2005; 237:395-400. 4. Gallstones.  ROS: Denies fever, malais, weight loss, blurry vision,  decreased visual acuity, cough, sputum, SOB, hemoptysis, pleuritic pain, palpitaitons, heartburn, abdominal pain, melena, lower extremity edema, claudication, or rash.  All other systems reviewed and negative  General: Affect appropriate Obese white female  HEENT: normal Neck supple with no adenopathy JVP normalbilateral bruits no thyromegaly Lungs clear with no wheezing and good diaphragmatic motion Heart:  S1/S2 no murmur, no rub, gallop or click PMI normal Abdomen: benighn, BS positve, no tenderness, no AAA no bruit.  No HSM or HJR Distal pulses intact with no bruits No edema Neuro non-focal Skin warm and dry No muscular weakness   Current Outpatient Prescriptions  Medication Sig Dispense Refill  . atorvastatin (LIPITOR) 80 MG tablet TAKE 1 TABLET (80 MG TOTAL) BY MOUTH DAILY. 90 tablet 3  . cilostazol (PLETAL) 100 MG tablet TAKE 1 TABLET (100 MG TOTAL) BY MOUTH 2 (TWO) TIMES DAILY. 180 tablet 1  . clopidogrel (PLAVIX) 75 MG tablet TAKE 1 TABLET (75 MG TOTAL) BY MOUTH DAILY. 90 tablet 0  . diclofenac sodium (VOLTAREN) 1 % GEL Apply 2 g topically daily as needed. For pain    . furosemide (LASIX) 20 MG tablet Take 1 tablet (20 mg total) by mouth daily. 90 tablet 3  . glimepiride (AMARYL) 2 MG tablet TAKE 1 TABLET (2 MG TOTAL) BY MOUTH DAILY BEFORE BREAKFAST. 30 tablet 0  . glucose blood test strip Test bid. DX E13.10 100 each 0  . linagliptin (TRADJENTA) 5 MG TABS tablet Take 1 tablet (5 mg total) by mouth daily. 30 tablet 0  .  LORazepam (ATIVAN) 0.5 MG tablet Take 1 tablet (0.5 mg total) by mouth 2 (two) times daily as needed for anxiety. 30 tablet 1  . losartan (COZAAR) 100 MG tablet Take 1 tablet (100 mg total) by mouth daily. 90 tablet 3  . metoprolol succinate (TOPROL-XL) 50 MG 24 hr tablet TAKE 1 AND 1/2 TABLETS BY MOUTH 2 (TWO) TIMES DAILY. 270 tablet 0  . nitroGLYCERIN (NITROSTAT) 0.4 MG SL tablet Place 0.4 mg under the tongue every 5 (five) minutes as needed for chest pain  (3 doses MAX).    Marland Kitchen ONETOUCH DELICA LANCETS FINE MISC Test blood sugar bid. DX E13.10 100 each 0  . sitaGLIPtin (JANUVIA) 50 MG tablet Take 1 tablet (50 mg total) by mouth daily. 30 tablet 1   No current facility-administered medications for this visit.    Allergies  Review of patient's allergies indicates no known allergies.  Electrocardiogram: 06/19/13  SR rate 70 normal ECG  08/20/14 Sr rate 81 normal  Assessment and Plan Carotid:  Post R CEA f/u [facility] Dr Scot Dock residual left   A999333 stenosis Diastolic Dysfunction:  Continue ARB and lasix 20 mg  Hypoxia: improved still smoking counseled  Not wearing oxygen resting sats normal HTN: see med changes above f/u 3 months  DM: Discussed low carb diet.  Target hemoglobin A1c is 6.5 or less.  Continue current medications. Chol:  Lab Results  Component Value Date   LDLCALC 61 08/27/2014      Jenkins Rouge

## 2015-08-28 ENCOUNTER — Encounter: Payer: Medicare Other | Admitting: Cardiovascular Disease

## 2015-09-01 ENCOUNTER — Other Ambulatory Visit: Payer: Self-pay | Admitting: Cardiovascular Disease

## 2015-09-18 ENCOUNTER — Other Ambulatory Visit: Payer: Self-pay

## 2015-09-18 MED ORDER — GLIMEPIRIDE 2 MG PO TABS: ORAL_TABLET | ORAL | Status: DC

## 2015-09-23 ENCOUNTER — Encounter: Payer: Self-pay | Admitting: Vascular Surgery

## 2015-09-29 ENCOUNTER — Other Ambulatory Visit: Payer: Self-pay | Admitting: Cardiovascular Disease

## 2015-09-30 ENCOUNTER — Other Ambulatory Visit: Payer: Self-pay | Admitting: Cardiovascular Disease

## 2015-10-01 ENCOUNTER — Encounter: Payer: Self-pay | Admitting: Vascular Surgery

## 2015-10-01 ENCOUNTER — Ambulatory Visit: Payer: Medicare Other | Admitting: Vascular Surgery

## 2015-10-01 ENCOUNTER — Ambulatory Visit (INDEPENDENT_AMBULATORY_CARE_PROVIDER_SITE_OTHER): Payer: Medicare Other | Admitting: Vascular Surgery

## 2015-10-01 VITALS — BP 104/69 | HR 83 | Temp 97.7°F | Resp 18 | Ht 60.0 in | Wt 260.0 lb

## 2015-10-01 DIAGNOSIS — I6523 Occlusion and stenosis of bilateral carotid arteries: Secondary | ICD-10-CM | POA: Diagnosis not present

## 2015-10-01 NOTE — Progress Notes (Signed)
[facility]ist [facility]  Patient name: [name] [name] MRN: [id number] DOB: [birth year] Sex: female  REASON FOR VISIT: 1 year follow up.  VQI PATIENT  HPI: [name] [name] is a 80 y.o. female who underwent a left carotid endarterectomy in February 2016. I last saw her in March 2016. She was doing well at that time. She has a known 60-79% right carotid stenosis however this was being followed at Dr. Kyla Balzarine office. She comes in for a 1 year follow up visit. She is due to have another carotid duplex scan at his office in April.   Since I saw her last, she denies any history of stroke, TIAs, expressive or receptive aphasia, or amaurosis fugax. Unfortunately, she does continue to smoke.  She is on a statin. She is not on aspirin because she is on Plavix.  Past Medical History  Diagnosis Date  . Bruit   . Hyperlipidemia   . HTN (hypertension)   . CAD   . PVD   . VENOUS INSUFFICIENCY   . DM   . OBESITY   . Edema   . PONV (postoperative nausea and vomiting)   . Angina 1995  . Carotid artery occlusion   . Myocardial infarction ([place]) 1995  . Shortness of breath dyspnea   . Anxiety   . Depression   . Cough     Family History  Problem Relation Age of Onset  . Anesthesia problems Neg Hx   . Hypotension Neg Hx   . Malignant hyperthermia Neg Hx   . Pseudochol deficiency Neg Hx   . Anuerysm Mother   . Stroke Father     SOCIAL HISTORY: Social History  Substance Use Topics  . Smoking status: Current Every Day Smoker -- 0.50 packs/day for 53 years    Types: Cigarettes  . Smokeless tobacco: Never Used     Comment: smoker since age 4 pt has not had cigarette since 09/11/14.  Marland Kitchen Alcohol Use: No    No Known Allergies  Current Outpatient Prescriptions  Medication Sig Dispense Refill  . atorvastatin (LIPITOR) 80 MG tablet TAKE 1 TABLET (80 MG TOTAL) BY MOUTH DAILY. 90 tablet 3  . cilostazol (PLETAL) 100 MG tablet TAKE 1 TABLET (100 MG TOTAL) BY MOUTH 2 (TWO) TIMES  DAILY. 180 tablet 2  . clopidogrel (PLAVIX) 75 MG tablet TAKE 1 TABLET (75 MG TOTAL) BY MOUTH DAILY. 90 tablet 0  . diclofenac sodium (VOLTAREN) 1 % GEL Apply 2 g topically daily as needed. For pain    . furosemide (LASIX) 20 MG tablet Take 1 tablet (20 mg total) by mouth daily. 90 tablet 3  . glimepiride (AMARYL) 2 MG tablet TAKE 1 TABLET (2 MG TOTAL) BY MOUTH DAILY BEFORE BREAKFAST. 30 tablet 2  . linagliptin (TRADJENTA) 5 MG TABS tablet Take 1 tablet (5 mg total) by mouth daily. 30 tablet 0  . LORazepam (ATIVAN) 0.5 MG tablet Take 1 tablet (0.5 mg total) by mouth 2 (two) times daily as needed for anxiety. 30 tablet 1  . losartan (COZAAR) 100 MG tablet Take 1 tablet (100 mg total) by mouth daily. 90 tablet 3  . metoprolol succinate (TOPROL-XL) 50 MG 24 hr tablet TAKE 1 AND 1/2 TABLETS BY MOUTH 2 (TWO) TIMES DAILY. 270 tablet 0  . nitroGLYCERIN (NITROSTAT) 0.4 MG SL tablet Place 0.4 mg under the tongue every 5 (five) minutes as needed for chest pain (3 doses MAX).    Marland Kitchen sitaGLIPtin (JANUVIA) 50 MG tablet Take  1 tablet (50 mg total) by mouth daily. 30 tablet 1   No current facility-administered medications for this visit.    REVIEW OF SYSTEMS:  [X]  denotes positive finding, [ ]  denotes negative finding Cardiac  Comments:  Chest pain or chest pressure:    Shortness of breath upon exertion:    Short of breath when lying flat:    Irregular heart rhythm:        Vascular    Pain in calf, thigh, or hip brought on by ambulation: X   Pain in feet at night that wakes you up from your sleep:     Blood clot in your veins:    Leg swelling:  X bilateral      Pulmonary    Oxygen at home:    Productive cough:  X   Wheezing:  X       Neurologic    Sudden weakness in arms or legs:     Sudden numbness in arms or legs:     Sudden onset of difficulty speaking or slurred speech:    Temporary loss of vision in one eye:     Problems with dizziness:         Gastrointestinal    Blood in stool:       Vomited blood:         Genitourinary    Burning when urinating:     Blood in urine:        Psychiatric    Major depression:         Hematologic    Bleeding problems:    Problems with blood clotting too easily:        Skin    Rashes or ulcers:        Constitutional    Fever or chills:      PHYSICAL EXAM: Filed Vitals:   10/01/15 1111 10/01/15 1114  BP: 133/57 104/69  Pulse: 83 83  Temp: 97.7 F (36.5 C)   Resp: 18   Height: 5' (1.524 m)   Weight: 260 lb (117.935 kg)   SpO2: 96%     GENERAL: The patient is a well-nourished female, in no acute distress. The vital signs are documented above. CARDIAC: There is a regular rate and rhythm.  VASCULAR: She has a right carotid bruit. Both feet are warm and well-perfused. She has mild bilateral lower extremity swelling. PULMONARY: There is good air exchange bilaterally without wheezing or rales. ABDOMEN: Soft and non-tender with normal pitched bowel sounds.  MUSCULOSKELETAL: There are no major deformities or cyanosis. NEUROLOGIC: No focal weakness or paresthesias are detected. SKIN: There are no ulcers or rashes noted. PSYCHIATRIC: The patient has a normal affect.  DATA:  I reviewed her carotid duplex scan that was done at the [facility] office on 04/24/2015. Her left carotid endarterectomy site was widely patent. She had a 60-79% right carotid stenosis which is stable. She was set up for a 6 month follow up visit.  MEDICAL ISSUES:  BILATERAL CAROTID STENOSES: The patient is doing well status post left carotid endarterectomy. There is no evidence of recurrent stenosis on her most recent duplex in September. She has a 60-79% right carotid stenosis which is being followed at the [place] office. I have explained that we would not consider right carotid endarterectomy unless the stenosis progressed to greater than 80% or developed right hemispheric symptoms. In order to simplify her care we can either see her here yearly for her  duplex and an office visit. Or,  I can leave it to Dr. Johnsie Cancel to refer her back to [place] if her right carotid stenosis progresses to greater than 80% or she develops new right hemispheric symptoms. She does know to continue taking her Plavix and statin. We have also again discussed the importance of tobacco cessation. I will see her back in 1 year with a carotid duplex scan and less she continues her follow up with Dr. Johnsie Cancel.   Deitra Mayo [facility] [place]: [phone]

## 2015-10-01 NOTE — Addendum Note (Signed)
Addended by: Reola Calkins on: 10/01/2015 02:54 PM   Modules accepted: Orders

## 2015-10-22 ENCOUNTER — Other Ambulatory Visit: Payer: Self-pay

## 2015-10-22 MED ORDER — GLIMEPIRIDE 2 MG PO TABS: ORAL_TABLET | ORAL | Status: DC

## 2015-10-23 ENCOUNTER — Other Ambulatory Visit: Payer: Self-pay | Admitting: Family Medicine

## 2015-10-24 NOTE — Telephone Encounter (Signed)
Last A1C 08/2014

## 2015-11-11 NOTE — Progress Notes (Signed)
Patient ID: [name] [name], female   DOB: [birth year], 80 y.o.   MRN: [id number]   Haillie returns today for followup. She has had previous stenting of the right coronary artery. She has had resting tachycardia. We started her on beta-blockers and she improved. We did not find a good reason forher tachycardia, although she is diabetic and may have some autonomic dysfunction. She has good LV function. She has bilateral carotid disease. 08/2014 had Left  CEA by Dr Doren Custard complicated by hypoxia.  Residual 60-79% right disease. Seen by Dr Elsworth Soho etiology of post op hypoxia unclear.  Does not want sleep study. CT no ILD.  No obstruction on PFT;s despite smoking history .   03/2015  Lasix added and HCTZ stopped  She has smoked since age 65. She is not motivated to quit. I counseled her for less than 10 minutes She had previously tried patches 2 years agao with limited success and they were too expensive. Willing to try Welbutrin but not Chantix . Her BS has been reasonably controlled despite a poor diet.   Echo 5/12 normal EF 55% mild LVH  CXR 12/14 NAD   No complaints says breathing better      ROS: Denies fever, malais, weight loss, blurry vision, decreased visual acuity, cough, sputum, SOB, hemoptysis, pleuritic pain, palpitaitons, heartburn, abdominal pain, melena, lower extremity edema, claudication, or rash.  All other systems reviewed and negative  General: Affect appropriate Obese white female  HEENT: normal Neck supple with no adenopathy JVP normalbilateral bilateral  Bruits left CEA  no thyromegaly Lungs clear with no wheezing and good diaphragmatic motion Heart:  S1/S2 no murmur, no rub, gallop or click PMI normal Abdomen: benighn, BS positve, no tenderness, no AAA no bruit.  No HSM or HJR Distal pulses intact with no bruits No edema Neuro non-focal Skin warm and dry No muscular weakness   Current Outpatient Prescriptions  Medication Sig Dispense Refill  . atorvastatin (LIPITOR) 80 MG  tablet TAKE 1 TABLET (80 MG TOTAL) BY MOUTH DAILY. 90 tablet 3  . cilostazol (PLETAL) 100 MG tablet TAKE 1 TABLET (100 MG TOTAL) BY MOUTH 2 (TWO) TIMES DAILY. 180 tablet 2  . clopidogrel (PLAVIX) 75 MG tablet TAKE 1 TABLET (75 MG TOTAL) BY MOUTH DAILY. 90 tablet 0  . diclofenac sodium (VOLTAREN) 1 % GEL Apply 2 g topically daily as needed. For pain    . furosemide (LASIX) 20 MG tablet Take 1 tablet (20 mg total) by mouth daily. 90 tablet 3  . glimepiride (AMARYL) 2 MG tablet TAKE 1 TABLET (2 MG TOTAL) BY MOUTH DAILY BEFORE BREAKFAST. 90 tablet 0  . JANUVIA 50 MG tablet TAKE 1 TABLET (50 MG TOTAL) BY MOUTH DAILY. 30 tablet 0  . linagliptin (TRADJENTA) 5 MG TABS tablet Take 1 tablet (5 mg total) by mouth daily. 30 tablet 0  . LORazepam (ATIVAN) 0.5 MG tablet Take 1 tablet (0.5 mg total) by mouth 2 (two) times daily as needed for anxiety. 30 tablet 1  . losartan (COZAAR) 100 MG tablet Take 1 tablet (100 mg total) by mouth daily. 90 tablet 3  . metoprolol succinate (TOPROL-XL) 50 MG 24 hr tablet TAKE 1 AND 1/2 TABLETS BY MOUTH 2 (TWO) TIMES DAILY. 270 tablet 0  . nitroGLYCERIN (NITROSTAT) 0.4 MG SL tablet Place 0.4 mg under the tongue every 5 (five) minutes as needed for chest pain (3 doses MAX).     No current facility-administered medications for this visit.    Allergies  Review of patient's allergies indicates no known allergies.  Electrocardiogram: 06/19/13  SR rate 70 normal ECG  08/20/14 Sr rate 81 normal  Assessment and Plan Carotid:  Post L CEA f/u [facility] Dr Scot Dock residual right  [id number] stenosis Diastolic Dysfunction:  [id number] echo reviewed EF 60-65%  Grade one diastolic dysfunction Improved with Lasix Hypoxia: improved still smoking counseled  Not wearing oxygen resting sats normal HTN: see med changes above f/u 3 months  DM: Discussed low carb diet.  Target hemoglobin A1c is 6.5 or less.  Continue current medications. Chol:  Lab Results  Component Value Date   LDLCALC 61 08/27/2014      Cozaar 100 Laisix 20 mg Carotid 6 months F/U Scot Dock F/U me 3 months  BMET 4 weeks  Jenkins Rouge  This encounter was created in error - please disregard.

## 2015-11-14 ENCOUNTER — Encounter: Payer: Medicare Other | Admitting: Cardiovascular Disease

## 2015-11-15 ENCOUNTER — Other Ambulatory Visit: Payer: Self-pay | Admitting: Cardiovascular Disease

## 2015-11-20 ENCOUNTER — Other Ambulatory Visit: Payer: Self-pay | Admitting: Family Medicine

## 2015-11-20 NOTE — Telephone Encounter (Signed)
Last seen 08/27/2014

## 2015-12-18 ENCOUNTER — Other Ambulatory Visit: Payer: Self-pay | Admitting: Family Medicine

## 2015-12-23 ENCOUNTER — Encounter (INDEPENDENT_AMBULATORY_CARE_PROVIDER_SITE_OTHER): Payer: Self-pay

## 2015-12-23 ENCOUNTER — Encounter: Payer: Self-pay | Admitting: Family Medicine

## 2015-12-23 ENCOUNTER — Ambulatory Visit (INDEPENDENT_AMBULATORY_CARE_PROVIDER_SITE_OTHER): Payer: Medicare Other | Admitting: Family Medicine

## 2015-12-23 VITALS — BP 183/73 | HR 77 | Temp 97.9°F | Ht 60.0 in | Wt 257.0 lb

## 2015-12-23 DIAGNOSIS — I6523 Occlusion and stenosis of bilateral carotid arteries: Secondary | ICD-10-CM

## 2015-12-23 DIAGNOSIS — N183 Chronic kidney disease, stage 3 unspecified: Secondary | ICD-10-CM

## 2015-12-23 DIAGNOSIS — I10 Essential (primary) hypertension: Secondary | ICD-10-CM

## 2015-12-23 DIAGNOSIS — E1122 Type 2 diabetes mellitus with diabetic chronic kidney disease: Secondary | ICD-10-CM | POA: Diagnosis not present

## 2015-12-23 DIAGNOSIS — E785 Hyperlipidemia, unspecified: Secondary | ICD-10-CM

## 2015-12-23 LAB — BAYER DCA HB A1C WAIVED: HB A1C: 6.8 % (ref <7.0)

## 2015-12-23 MED ORDER — LORAZEPAM 0.5 MG PO TABS: 0.5000 mg | ORAL_TABLET | Freq: Two times a day (BID) | ORAL | Status: DC | PRN

## 2015-12-23 MED ORDER — CILOSTAZOL 100 MG PO TABS: ORAL_TABLET | ORAL | Status: DC

## 2015-12-23 MED ORDER — CLOPIDOGREL BISULFATE 75 MG PO TABS: ORAL_TABLET | ORAL | Status: DC

## 2015-12-23 MED ORDER — LOSARTAN POTASSIUM 100 MG PO TABS: 100.0000 mg | ORAL_TABLET | Freq: Every day | ORAL | Status: DC

## 2015-12-23 MED ORDER — NITROGLYCERIN 0.4 MG SL SUBL: 0.4000 mg | SUBLINGUAL_TABLET | SUBLINGUAL | Status: DC | PRN

## 2015-12-23 MED ORDER — SITAGLIPTIN PHOSPHATE 50 MG PO TABS: ORAL_TABLET | ORAL | Status: DC

## 2015-12-23 MED ORDER — GLIMEPIRIDE 2 MG PO TABS: ORAL_TABLET | ORAL | Status: DC

## 2015-12-23 MED ORDER — FUROSEMIDE 20 MG PO TABS: 20.0000 mg | ORAL_TABLET | Freq: Every day | ORAL | Status: DC

## 2015-12-23 MED ORDER — ATORVASTATIN CALCIUM 80 MG PO TABS: ORAL_TABLET | ORAL | Status: DC

## 2015-12-23 MED ORDER — METOPROLOL SUCCINATE ER 50 MG PO TB24: ORAL_TABLET | ORAL | Status: DC

## 2015-12-23 NOTE — Progress Notes (Signed)
Subjective:    Patient ID: [name] [name], female    DOB: 1935-09-12, 80 y.o.   MRN: [id number]  HPI Pt here for follow up and management of chronic medical problems which includes hypertension, hyperlipidemia, and diabetes. She is taking medications regularly.  Patient has not been here in about 15 months. Since her last visit here she had carotid endarterectomy and has had follow-up with the vascular surgeon. Blood flow was good but she still does have some bruits. Today she complains mostly of dependent edema. There is no history of congestive failure or renal disease. Edema resolves overnight quickly recurs during the day. For diabetes she takes combination of Amaryl and Januvia.    Patient Active Problem List   Diagnosis Date Noted  . Hypoxia 09/14/2014  . Bilateral carotid artery stenosis 09/12/2014  . CKD (chronic kidney disease), stage III 11/02/2013  . SMOKER 05/30/2009  . Bilateral carotid bruits 05/30/2009  . DM (diabetes mellitus) type 2, uncontrolled, with ketoacidosis ([place]) 05/29/2009  . Hyperlipidemia 05/29/2009  . OBESITY 05/29/2009  . Essential hypertension 05/29/2009  . Coronary atherosclerosis 05/29/2009  . PVD 05/29/2009  . VENOUS INSUFFICIENCY 05/29/2009  . Edema 05/29/2009   Outpatient Encounter Prescriptions as of 12/23/2015  Medication Sig  . atorvastatin (LIPITOR) 80 MG tablet TAKE 1 TABLET (80 MG TOTAL) BY MOUTH DAILY.  . cilostazol (PLETAL) 100 MG tablet TAKE 1 TABLET (100 MG TOTAL) BY MOUTH 2 (TWO) TIMES DAILY.  Marland Kitchen clopidogrel (PLAVIX) 75 MG tablet TAKE 1 TABLET (75 MG TOTAL) BY MOUTH DAILY.  . furosemide (LASIX) 20 MG tablet Take 1 tablet (20 mg total) by mouth daily.  Marland Kitchen glimepiride (AMARYL) 2 MG tablet TAKE 1 TABLET (2 MG TOTAL) BY MOUTH DAILY BEFORE BREAKFAST.  Marland Kitchen JANUVIA 50 MG tablet TAKE 1 TABLET (50 MG TOTAL) BY MOUTH DAILY.  Marland Kitchen LORazepam (ATIVAN) 0.5 MG tablet Take 1 tablet (0.5 mg total) by mouth 2 (two) times daily as needed for anxiety.  Marland Kitchen  losartan (COZAAR) 100 MG tablet Take 1 tablet (100 mg total) by mouth daily.  . metoprolol succinate (TOPROL-XL) 50 MG 24 hr tablet TAKE 1 AND 1/2 TABLETS BY MOUTH 2 (TWO) TIMES DAILY.  Marland Kitchen diclofenac sodium (VOLTAREN) 1 % GEL Apply 2 g topically daily as needed. Reported on 12/23/2015  . nitroGLYCERIN (NITROSTAT) 0.4 MG SL tablet Place 0.4 mg under the tongue every 5 (five) minutes as needed for chest pain (3 doses MAX). Reported on 12/23/2015  . [DISCONTINUED] linagliptin (TRADJENTA) 5 MG TABS tablet Take 1 tablet (5 mg total) by mouth daily.   No facility-administered encounter medications on file as of 12/23/2015.       Review of Systems  Constitutional: Negative.   HENT: Negative.   Eyes: Negative.   Respiratory: Negative.   Cardiovascular: Positive for leg swelling.  Gastrointestinal: Negative.   Endocrine: Negative.   Genitourinary: Negative.   Musculoskeletal: Negative.   Skin: Negative.   Allergic/Immunologic: Negative.   Neurological: Negative.   Hematological: Negative.   Psychiatric/Behavioral: Negative.        Objective:   Physical Exam  Constitutional: She is oriented to person, place, and time. She appears well-developed and well-nourished.  HENT:  Head: Normocephalic.  Neck:  Patient has bilateral bruits  Cardiovascular: Normal rate, regular rhythm and normal heart sounds.   Pulmonary/Chest: Effort normal and breath sounds normal.  Musculoskeletal: Edema: patient has 2-3+ edema bilaterally in her feet and ankles.  Neurological: She is alert and oriented to person, place, and time.  Psychiatric: She has a normal mood and affect. Her behavior is normal.    BP 183/73 mmHg  Pulse 77  Temp(Src) 97.9 F (36.6 C) (Oral)  Ht 5' (1.524 m)  Wt 257 lb (116.574 kg)  BMI 50.19 kg/m2       Assessment & Plan:  1. Essential hypertension Blood pressure is elevated today with systolic at 619 although diastolic is normal. She takes losartan 100 mg metoprolol XL 50  mg - CMP14+EGFR  2. Hyperlipidemia Lipids have not been assessed that we need to keep lipids controlled given her carotid and vascular disease - Lipid panel  3. CKD (chronic kidney disease), stage III This may have some bearing on her edema. We'll continue Lasix - CMP14+EGFR  4. Type 2 diabetes mellitus with stage 3 chronic kidney disease, without long-term current use of insulin (HCC) Need to reassess with A1c By history sugars are fairly well controlled at least the fasting numbers - [employer] DCA Hb A1c Waived  Wardell Honour MD

## 2015-12-23 NOTE — Patient Instructions (Signed)
Medicare Annual Wellness Visit  [place] and the medical providers at [facility] strive to bring you the best medical care.  In doing so we not only want to address your current medical conditions and concerns but also to detect new conditions early and prevent illness, disease and health-related problems.    Medicare offers a yearly Wellness Visit which allows our clinical staff to assess your need for preventative services including immunizations, lifestyle education, counseling to decrease risk of preventable diseases and screening for fall risk and other medical concerns.    This visit is provided free of charge (no copay) for all Medicare recipients. The clinical pharmacists at [facility] have begun to conduct these Wellness Visits which will also include a thorough review of all your medications.    As you primary medical provider recommend that you make an appointment for your Annual Wellness Visit if you have not done so already this year.  You may set up this appointment before you leave today or you may call back ([phone]) and schedule an appointment.  Please make sure when you call that you mention that you are scheduling your Annual Wellness Visit with the clinical pharmacist so that the appointment may be made for the proper length of time.     Continue current medications. Continue good therapeutic lifestyle changes which include good diet and exercise. Fall precautions discussed with patient. If an FOBT was given today- please return it to our front desk. If you are over 50 years old - you may need Prevnar 13 or the adult Pneumonia vaccine.  **Flu shots are available--- please call and schedule a FLU-CLINIC appointment**  After your visit with us today you will receive a survey in the mail or online from [employer] regarding your care with us. Please take a moment to fill this out. Your feedback is very  important to us as you can help us better understand your patient needs as well as improve your experience and satisfaction. WE CARE ABOUT YOU!!!    

## 2015-12-24 LAB — SPECIMEN STATUS

## 2015-12-24 LAB — LIPID PANEL
Chol/HDL Ratio: 2.3 ratio units (ref 0.0–4.4)
Cholesterol, Total: 124 mg/dL (ref 100–199)
HDL: 55 mg/dL (ref >39)
LDL Calculated: 45 mg/dL (ref 0–99)
Triglycerides: 120 mg/dL (ref 0–149)
VLDL CHOLESTEROL CAL: 24 mg/dL (ref 5–40)

## 2015-12-24 LAB — CMP14+EGFR
ALBUMIN: 3.7 g/dL (ref 3.5–4.8)
ALT: 9 IU/L (ref 0–32)
AST: 13 IU/L (ref 0–40)
Albumin/Globulin Ratio: 1.3 (ref 1.2–2.2)
Alkaline Phosphatase: 144 IU/L — ABNORMAL HIGH (ref 39–117)
BUN / CREAT RATIO: 11 — AB (ref 12–28)
BUN: 16 mg/dL (ref 8–27)
Bilirubin Total: 0.8 mg/dL (ref 0.0–1.2)
CALCIUM: 8.7 mg/dL (ref 8.7–10.3)
CHLORIDE: 103 mmol/L (ref 96–106)
CO2: 24 mmol/L (ref 18–29)
CREATININE: 1.4 mg/dL — AB (ref 0.57–1.00)
GFR calc non Af Amer: 36 mL/min/{1.73_m2} — ABNORMAL LOW (ref >59)
GFR, EST AFRICAN AMERICAN: 42 mL/min/{1.73_m2} — AB (ref >59)
GLOBULIN, TOTAL: 2.8 g/dL (ref 1.5–4.5)
Glucose: 147 mg/dL — ABNORMAL HIGH (ref 65–99)
Potassium: 4.4 mmol/L (ref 3.5–5.2)
SODIUM: 146 mmol/L — AB (ref 134–144)
TOTAL PROTEIN: 6.5 g/dL (ref 6.0–8.5)

## 2015-12-27 ENCOUNTER — Other Ambulatory Visit: Payer: Self-pay | Admitting: Cardiovascular Disease

## 2015-12-29 ENCOUNTER — Ambulatory Visit: Payer: Medicare Other | Admitting: Cardiovascular Disease

## 2016-01-02 NOTE — Progress Notes (Signed)
Cardiology Office Note    Date:  01/05/2016   ID:  [name] [name], DOB 01-09-1936, MRN [id number]  PCP:  Wardell Honour, MD  Cardiologist:  Dr. Johnsie Cancel    3 month follow up  History of Present Illness:  [name] [name] is a 80 y.o. female with a history of CAD s/p BMS to RCA (2006), resting sinus tachycardia possibly 2/2 autonomic dysfunction (controlled on BB), HTN, bilateral carotid artery disease s/p L CEA (08/2014), continued tobacco abuse, DMT2, and CKD who presents to clinic for follow up.   She has bilateral carotid disease. 08/2014 had L CEA by Dr Doren Custard complicated by hypoxia. Residual 60-79% left disease. Seen by Dr Elsworth Soho etiology of post op hypoxia unclear. Does not want sleep study. CT no ILD. No obstruction on PFT;s despite smoking history .He thought a trial of diuretics was in order for history of diastolic dysfunction even though euvolemic on exam.   She saw Dr. Johnsie Cancel in the office in 03/2015 and he stopped her lisinopril HCTZ and started Cozaar 100mg  daily and lasix 20mg . Plan was for repeat carotid duplex in 6 months and follow up with Dr. Scot Dock. She saw Dr. Scot Dock in 09/2015 and he planned to have [place] follow her residual right carotid artery disease. No plans for CEA unless stenosis progresses to over 80% or she develops right hemispheric sx.   Today she presents to clinic for follow up. She is feeling like an "old woman." No CP or SOB. No LE edema, orthopnea or PND.She does have some swelling in her feet. She sleeps in a recliner due to heart burn.  No palpitations, dizziness or syncope. No blood in her stool or urine. Still smoking 1 pack every 3 days.    Past Medical History  Diagnosis Date  . Bruit   . Hyperlipidemia   . HTN (hypertension)   . CAD   . PVD   . VENOUS INSUFFICIENCY   . DM   . OBESITY   . Edema   . PONV (postoperative nausea and vomiting)   . Angina 1995  . Carotid artery occlusion   . Myocardial infarction ([place]) 1995  . Shortness of breath  dyspnea   . Anxiety   . Depression   . Cough     Past Surgical History  Procedure Laterality Date  . Coronary angioplasty with stent placement  2006  . Cataract extraction w/phaco  11/01/2011    Procedure: CATARACT EXTRACTION PHACO AND INTRAOCULAR LENS PLACEMENT (IOC);  Surgeon: Tonny Branch, MD;  Location: [facility];  Service: Ophthalmology;  Laterality: Right;  CDE:12.21  . Cataract extraction w/phaco  01/31/2012    Procedure: CATARACT EXTRACTION PHACO AND INTRAOCULAR LENS PLACEMENT (IOC);  Surgeon: Tonny Branch, MD;  Location: [facility];  Service: Ophthalmology;  Laterality: Left;  CDE:11.77  . Eye surgery    . Endarterectomy Left 09/12/2014    Procedure: LEFT CAROTID ARTERY ENDARTERECTOMY;  Surgeon: Angelia Mould, MD;  Location: [facility];  Service: Vascular;  Laterality: Left;  . Patch angioplasty Left 09/12/2014    Procedure: WITH DACRON PATCH ANGIOPLASTY;  Surgeon: Angelia Mould, MD;  Location: [facility];  Service: Vascular;  Laterality: Left;  Marland Kitchen Eye surgery Bilateral     Current Medications: Outpatient Prescriptions Prior to Visit  Medication Sig Dispense Refill  . atorvastatin (LIPITOR) 80 MG tablet TAKE 1 TABLET (80 MG TOTAL) BY MOUTH DAILY. 90 tablet 3  . cilostazol (PLETAL) 100 MG tablet TAKE 1 TABLET (100 MG TOTAL) BY  MOUTH 2 (TWO) TIMES DAILY. 180 tablet 3  . clopidogrel (PLAVIX) 75 MG tablet TAKE 1 TABLET (75 MG TOTAL) BY MOUTH DAILY. 90 tablet 3  . diclofenac sodium (VOLTAREN) 1 % GEL Apply 2 g topically daily as needed. Reported on 12/23/2015    . furosemide (LASIX) 20 MG tablet Take 1 tablet (20 mg total) by mouth daily. 90 tablet 3  . glimepiride (AMARYL) 2 MG tablet TAKE 1 TABLET (2 MG TOTAL) BY MOUTH DAILY BEFORE BREAKFAST. 90 tablet 0  . LORazepam (ATIVAN) 0.5 MG tablet Take 1 tablet (0.5 mg total) by mouth 2 (two) times daily as needed for anxiety. 30 tablet 2  . losartan (COZAAR) 100 MG tablet Take 1 tablet (100 mg total) by mouth daily. 90 tablet 3  . metoprolol  succinate (TOPROL-XL) 50 MG 24 hr tablet TAKE 1 AND 1/2 TABLETS BY MOUTH 2 (TWO) TIMES DAILY. 270 tablet 3  . nitroGLYCERIN (NITROSTAT) 0.4 MG SL tablet Place 1 tablet (0.4 mg total) under the tongue every 5 (five) minutes as needed for chest pain (3 doses MAX). Reported on 12/23/2015 25 tablet 1  . sitaGLIPtin (JANUVIA) 50 MG tablet TAKE 1 TABLET (50 MG TOTAL) BY MOUTH DAILY. 90 tablet 3   No facility-administered medications prior to visit.     Allergies:   Review of patient's allergies indicates no known allergies.   Social History   Social History  . Marital Status: Widowed    Spouse Name: N/A  . Number of Children: N/A  . Years of Education: N/A   Social History Main Topics  . Smoking status: Current Every Day Smoker -- 0.50 packs/day for 53 years    Types: Cigarettes  . Smokeless tobacco: Never Used     Comment: smoker since age 65 pt has not had cigarette since 09/11/14.  Marland Kitchen Alcohol Use: No  . Drug Use: No  . Sexual Activity: Yes    Birth Control/ Protection: Post-menopausal   Other Topics Concern  . None   Social History Narrative     Family History:  The patient's family history includes Anuerysm in her mother; Stroke in her father. There is no history of Anesthesia problems, Hypotension, Malignant hyperthermia, or Pseudochol deficiency.   ROS:   Please see the history of present illness.    ROS All other systems reviewed and are negative.   PHYSICAL EXAM:   VS:  BP 102/70 mmHg  Pulse 70  Ht 5\' 2"  (1.575 m)  Wt 258 lb 12.8 oz (117.391 kg)  BMI 47.32 kg/m2  SpO2 94%   GEN: Well nourished, well developed, in no acute distressobese HEENT: normal Neck: no JVD, bilateral carotid bruits, or masses Cardiac: RRR; no murmurs, rubs, or gallops,no edema. Edema in feet Respiratory:  clear to auscultation bilaterally, normal work of breathing GI: soft, nontender, nondistended, + BS MS: no deformity or atrophy Skin: warm and dry, no rash Neuro:  Alert and Oriented x 3,  Strength and sensation are intact Psych: euthymic mood, full affect  Wt Readings from Last 3 Encounters:  01/05/16 258 lb 12.8 oz (117.391 kg)  12/23/15 257 lb (116.574 kg)  10/01/15 260 lb (117.935 kg)      Studies/Labs Reviewed:   EKG:  EKG is ordered today.  The ekg ordered today demonstrates NSR HR 70  Recent Labs: 12/23/2015: ALT 9; BUN 16; Creatinine, Ser 1.40*; Platelets WILL FOLLOW; Potassium 4.4; Sodium 146*   Lipid Panel    Component Value Date/Time   CHOL 124 12/23/2015 1117  CHOL 132 11/02/2013 1526   CHOL 130 10/30/2012 1615   TRIG 120 12/23/2015 1117   TRIG 175* 11/02/2013 1526   TRIG 143 10/30/2012 1615   HDL 55 12/23/2015 1117   HDL 45 11/02/2013 1526   HDL 49 10/30/2012 1615   CHOLHDL 2.3 12/23/2015 1117   LDLCALC 45 12/23/2015 1117   LDLCALC 52 11/02/2013 1526   LDLCALC 52 10/30/2012 1615    Additional studies/ records that were reviewed today include:  2 DECHO: 12/04/2012 LV EF: 55% -  60% Study Conclusions - Left ventricle: The cavity size was normal. Wall thickness was increased in a pattern of mild LVH. Systolic function was normal. The estimated ejection fraction was in the range of 55% to 60%. Wall motion was normal; there were no regional wall motion abnormalities. Doppler parameters are consistent with abnormal left ventricular relaxation (grade 1 diastolic dysfunction). - Mitral valve: Calcified annulus. - Left atrium: The atrium was mildly dilated.   ASSESSMENT & PLAN:   CAD: stable. Continue plavix, statin and BB.   Carotid artery disease: s/p L CEA. She saw Dr. Scot Dock in 09/2015 and he planned to have [place] follow her residual right carotid artery disease. No plans for CEA unless stenosis progresses to over 80% or she develops right hemispheric sx. She is due for carotid dopplers. I see an order for this by Dr. Scot Dock in [facility], but never completed by patient. Will have this set up.   Chronic diastolic dysfunction: stable  on lasix 20mg  daily.   HTN: BP well controlled on current regimen.   HLD: continue statin. LDL excellent at 45 on most recent lipid panel ordered by PCP in 11/2015.  DMT2: most recent HgA1c 6.8. Continue to follow with PCP.   Hypoxia: counseled on importance of smoking cessation. No longer on oxygen. Still smoking and doesn't think she can quit.    CKD: creat baseline ~ 1.4   Hx of resting sinus tachycardia: possibly 2/2 autonomic dysfunction (controlled on BB)  Medication Adjustments/Labs and Tests Ordered: Current medicines are reviewed at length with the patient today.  Concerns regarding medicines are outlined above.  Medication changes, Labs and Tests ordered today are listed in the Patient Instructions below. Patient Instructions  Your physician recommends that you continue on your current medications as directed. Please refer to the Current Medication list given to you today.  Your physician wants you to follow-up in: 6 months with Dr. Johnsie Cancel.  You will receive a reminder letter in the mail two months in advance. If you don't receive a letter, please call our office to schedule the follow-up appointment.   Mable Fill, PA-C  01/05/2016 12:04 PM    [facility], [place], [place]  [address] Phone: [phone]; Fax: [phone]

## 2016-01-05 ENCOUNTER — Ambulatory Visit (INDEPENDENT_AMBULATORY_CARE_PROVIDER_SITE_OTHER): Payer: Medicare Other | Admitting: Physician Assistant

## 2016-01-05 ENCOUNTER — Encounter: Payer: Self-pay | Admitting: Physician Assistant

## 2016-01-05 VITALS — BP 102/70 | HR 70 | Ht 62.0 in | Wt 258.8 lb

## 2016-01-05 DIAGNOSIS — I10 Essential (primary) hypertension: Secondary | ICD-10-CM | POA: Diagnosis not present

## 2016-01-05 DIAGNOSIS — I6523 Occlusion and stenosis of bilateral carotid arteries: Secondary | ICD-10-CM

## 2016-01-05 NOTE — Patient Instructions (Signed)
Your physician recommends that you continue on your current medications as directed. Please refer to the Current Medication list given to you today.  Your physician wants you to follow-up in: 6 months with Dr. Nishan. You will receive a reminder letter in the mail two months in advance. If you don't receive a letter, please call our office to schedule the follow-up appointment.  

## 2016-03-25 NOTE — Progress Notes (Signed)
Subjective:    Patient ID: [name] [name], female    DOB: 1936-07-24, 80 y.o.   MRN: [id number]  HPI 80 year old female with diabetes and hypertension. She has no specific complaints today. Her last hemoglobin A1c was 6.84 months ago. Blood pressure is well controlled on losartan 100 mg and metoprolol  Patient Active Problem List   Diagnosis Date Noted  . Hypoxia 09/14/2014  . Bilateral carotid artery stenosis 09/12/2014  . CKD (chronic kidney disease), stage III 11/02/2013  . SMOKER 05/30/2009  . Bilateral carotid bruits 05/30/2009  . DM (diabetes mellitus) type 2, uncontrolled, with ketoacidosis ([place]) 05/29/2009  . Hyperlipidemia 05/29/2009  . OBESITY 05/29/2009  . Essential hypertension 05/29/2009  . Coronary atherosclerosis 05/29/2009  . PVD 05/29/2009  . VENOUS INSUFFICIENCY 05/29/2009  . Edema 05/29/2009   Outpatient Encounter Prescriptions as of 03/26/2016  Medication Sig  . atorvastatin (LIPITOR) 80 MG tablet TAKE 1 TABLET (80 MG TOTAL) BY MOUTH DAILY.  . cilostazol (PLETAL) 100 MG tablet TAKE 1 TABLET (100 MG TOTAL) BY MOUTH 2 (TWO) TIMES DAILY.  Marland Kitchen clopidogrel (PLAVIX) 75 MG tablet TAKE 1 TABLET (75 MG TOTAL) BY MOUTH DAILY.  Marland Kitchen diclofenac sodium (VOLTAREN) 1 % GEL Apply 2 g topically daily as needed. Reported on 12/23/2015  . furosemide (LASIX) 20 MG tablet Take 1 tablet (20 mg total) by mouth daily.  Marland Kitchen glimepiride (AMARYL) 2 MG tablet TAKE 1 TABLET (2 MG TOTAL) BY MOUTH DAILY BEFORE BREAKFAST.  Marland Kitchen LORazepam (ATIVAN) 0.5 MG tablet Take 1 tablet (0.5 mg total) by mouth 2 (two) times daily as needed for anxiety.  Marland Kitchen losartan (COZAAR) 100 MG tablet Take 1 tablet (100 mg total) by mouth daily.  . metoprolol succinate (TOPROL-XL) 50 MG 24 hr tablet TAKE 1 AND 1/2 TABLETS BY MOUTH 2 (TWO) TIMES DAILY.  . nitroGLYCERIN (NITROSTAT) 0.4 MG SL tablet Place 1 tablet (0.4 mg total) under the tongue every 5 (five) minutes as needed for chest pain (3 doses MAX). Reported on 12/23/2015  .  sitaGLIPtin (JANUVIA) 50 MG tablet TAKE 1 TABLET (50 MG TOTAL) BY MOUTH DAILY.   No facility-administered encounter medications on file as of 03/26/2016.       Review of Systems  Constitutional: Negative.   Eyes: Negative.   Respiratory: Negative.   Cardiovascular: Negative.   Genitourinary: Negative.   Neurological: Negative.   Psychiatric/Behavioral: Negative.        Objective:   Physical Exam  Constitutional: She is oriented to person, place, and time. She appears well-developed.  Obese  Cardiovascular: Normal rate, regular rhythm and normal heart sounds.   Pulmonary/Chest: Effort normal and breath sounds normal.  Neurological: She is alert and oriented to person, place, and time.  Psychiatric: She has a normal mood and affect. Her behavior is normal.   BP 120/83 (BP Location: Right Wrist, Patient Position: Sitting, Cuff Size: Small)   Pulse 83   Temp 98 F (36.7 C) (Oral)   Ht 5\' 2"  (1.575 m)   Wt 255 lb 3.2 oz (115.8 kg)   BMI 46.68 kg/m         Assessment & Plan:  1. Uncontrolled type 2 diabetes mellitus with ketoacidosis without coma, without long-term current use of insulin ([place]) Need updated A1c but sugars at home have been good. Current medication includes Januvia and Amaryl  2. CKD (chronic kidney disease), stage III She has seen nephrology in the past but not in a while she does have stage III chronic kidney disease. Her  edema is likely related.  Wardell Honour MD

## 2016-03-26 ENCOUNTER — Encounter: Payer: Self-pay | Admitting: Family Medicine

## 2016-03-26 ENCOUNTER — Ambulatory Visit (INDEPENDENT_AMBULATORY_CARE_PROVIDER_SITE_OTHER): Payer: Medicare Other | Admitting: Family Medicine

## 2016-03-26 VITALS — BP 120/83 | HR 83 | Temp 98.0°F | Ht 62.0 in | Wt 255.2 lb

## 2016-03-26 DIAGNOSIS — I6523 Occlusion and stenosis of bilateral carotid arteries: Secondary | ICD-10-CM | POA: Diagnosis not present

## 2016-03-26 DIAGNOSIS — E1110 Type 2 diabetes mellitus with ketoacidosis without coma: Secondary | ICD-10-CM

## 2016-03-26 DIAGNOSIS — N183 Chronic kidney disease, stage 3 unspecified: Secondary | ICD-10-CM

## 2016-03-26 DIAGNOSIS — E1310 Other specified diabetes mellitus with ketoacidosis without coma: Secondary | ICD-10-CM

## 2016-03-26 LAB — BAYER DCA HB A1C WAIVED: HB A1C (BAYER DCA - WAIVED): 6.8 % (ref <7.0)

## 2016-03-26 NOTE — Addendum Note (Signed)
Addended by: Thana Ates on: 03/26/2016 03:15 PM   Modules accepted: Orders

## 2016-03-27 LAB — CMP14+EGFR
ALK PHOS: 149 IU/L — AB (ref 39–117)
ALT: 8 IU/L (ref 0–32)
AST: 13 IU/L (ref 0–40)
Albumin/Globulin Ratio: 1.2 (ref 1.2–2.2)
Albumin: 3.6 g/dL (ref 3.5–4.8)
BUN/Creatinine Ratio: 10 — ABNORMAL LOW (ref 12–28)
BUN: 16 mg/dL (ref 8–27)
Bilirubin Total: 0.6 mg/dL (ref 0.0–1.2)
CALCIUM: 8.8 mg/dL (ref 8.7–10.3)
CO2: 27 mmol/L (ref 18–29)
CREATININE: 1.6 mg/dL — AB (ref 0.57–1.00)
Chloride: 101 mmol/L (ref 96–106)
GFR calc Af Amer: 36 mL/min/{1.73_m2} — ABNORMAL LOW (ref >59)
GFR, EST NON AFRICAN AMERICAN: 31 mL/min/{1.73_m2} — AB (ref >59)
GLOBULIN, TOTAL: 3.1 g/dL (ref 1.5–4.5)
GLUCOSE: 139 mg/dL — AB (ref 65–99)
Potassium: 4.6 mmol/L (ref 3.5–5.2)
SODIUM: 144 mmol/L (ref 134–144)
Total Protein: 6.7 g/dL (ref 6.0–8.5)

## 2016-04-18 ENCOUNTER — Other Ambulatory Visit: Payer: Self-pay | Admitting: Family Medicine

## 2016-07-16 ENCOUNTER — Other Ambulatory Visit: Payer: Self-pay | Admitting: Family Medicine

## 2016-08-05 ENCOUNTER — Ambulatory Visit: Payer: Medicare Other | Admitting: Cardiovascular Disease

## 2016-08-23 ENCOUNTER — Encounter: Payer: Self-pay | Admitting: Cardiology

## 2016-09-24 ENCOUNTER — Encounter: Payer: Self-pay | Admitting: Vascular Surgery

## 2016-10-06 ENCOUNTER — Ambulatory Visit: Payer: Medicare Other

## 2016-10-06 ENCOUNTER — Encounter (HOSPITAL_COMMUNITY): Payer: Medicare Other

## 2016-10-15 ENCOUNTER — Other Ambulatory Visit: Payer: Self-pay | Admitting: Family Medicine

## 2016-11-03 NOTE — Progress Notes (Deleted)
Cardiology Office Note    Date:  11/03/2016   ID:  [name] [name], DOB [birth year], MRN [id number]  PCP:  Wardell Honour, MD  Cardiologist:  Dr. Johnsie Cancel    3 month follow up  History of Present Illness:  [name] [name] is a 81 y.o. female with a history of CAD s/p BMS to RCA (2006), resting sinus tachycardia possibly 2/2 autonomic dysfunction (controlled on BB), HTN, bilateral carotid artery disease s/p L CEA (08/2014), continued tobacco abuse, DMT2, and CKD who presents to clinic for follow up.   She has bilateral carotid disease. 08/2014 had L CEA by Dr Doren Custard complicated by hypoxia. Residual 60-79% left disease. Seen by Dr Elsworth Soho etiology of post op hypoxia unclear. Does not want sleep study. CT no ILD. No obstruction on PFT;s despite smoking history .He thought a trial of diuretics was in order for history of diastolic dysfunction even though euvolemic on exam.   Last seen by me  in the office 03/2015  stopped her lisinopril HCTZ and started Cozaar 100mg  daily and lasix 20mg . Plan was for repeat carotid duplex in 6 months and follow up with Dr. Scot Dock. She saw Dr. Scot Dock in 09/2015 and he planned to have [place] follow her residual right carotid artery disease. No plans for CEA unless stenosis progresses to over 80% or she develops right hemispheric sx. Note on duplex her systolic RICA velocity is over 68m/sec and diastolic over 43m/sec   Still smoking 1 pack every 3 days.    Past Medical History:  Diagnosis Date  . Angina 1995  . Anxiety   . Bruit   . CAD   . Carotid artery occlusion   . Cough   . Depression   . DM   . Edema   . HTN (hypertension)   . Hyperlipidemia   . Myocardial infarction 1995  . OBESITY   . PONV (postoperative nausea and vomiting)   . PVD   . Shortness of breath dyspnea   . VENOUS INSUFFICIENCY     Past Surgical History:  Procedure Laterality Date  . CATARACT EXTRACTION W/PHACO  11/01/2011   Procedure: CATARACT EXTRACTION PHACO AND INTRAOCULAR LENS  PLACEMENT (IOC);  Surgeon: Tonny Branch, MD;  Location: [facility];  Service: Ophthalmology;  Laterality: Right;  CDE:12.21  . CATARACT EXTRACTION W/PHACO  01/31/2012   Procedure: CATARACT EXTRACTION PHACO AND INTRAOCULAR LENS PLACEMENT (IOC);  Surgeon: Tonny Branch, MD;  Location: [facility];  Service: Ophthalmology;  Laterality: Left;  CDE:11.77  . CORONARY ANGIOPLASTY WITH STENT PLACEMENT  2006  . ENDARTERECTOMY Left 09/12/2014   Procedure: LEFT CAROTID ARTERY ENDARTERECTOMY;  Surgeon: Angelia Mould, MD;  Location: [facility];  Service: Vascular;  Laterality: Left;  . EYE SURGERY    . EYE SURGERY Bilateral   . PATCH ANGIOPLASTY Left 09/12/2014   Procedure: WITH DACRON PATCH ANGIOPLASTY;  Surgeon: Angelia Mould, MD;  Location: [facility];  Service: Vascular;  Laterality: Left;    Current Medications: Outpatient Medications Prior to Visit  Medication Sig Dispense Refill  . atorvastatin (LIPITOR) 80 MG tablet TAKE 1 TABLET (80 MG TOTAL) BY MOUTH DAILY. 90 tablet 3  . cilostazol (PLETAL) 100 MG tablet TAKE 1 TABLET (100 MG TOTAL) BY MOUTH 2 (TWO) TIMES DAILY. 180 tablet 3  . clopidogrel (PLAVIX) 75 MG tablet TAKE 1 TABLET (75 MG TOTAL) BY MOUTH DAILY. 90 tablet 3  . diclofenac sodium (VOLTAREN) 1 % GEL Apply 2 g topically daily as needed. Reported on 12/23/2015    .  furosemide (LASIX) 20 MG tablet Take 1 tablet (20 mg total) by mouth daily. 90 tablet 3  . glimepiride (AMARYL) 2 MG tablet TAKE 1 TABLET (2 MG TOTAL) BY MOUTH DAILY BEFORE BREAKFAST. 90 tablet 0  . LORazepam (ATIVAN) 0.5 MG tablet Take 1 tablet (0.5 mg total) by mouth 2 (two) times daily as needed for anxiety. 30 tablet 2  . losartan (COZAAR) 100 MG tablet Take 1 tablet (100 mg total) by mouth daily. 90 tablet 3  . metoprolol succinate (TOPROL-XL) 50 MG 24 hr tablet TAKE 1 AND 1/2 TABLETS BY MOUTH 2 (TWO) TIMES DAILY. 270 tablet 3  . nitroGLYCERIN (NITROSTAT) 0.4 MG SL tablet Place 1 tablet (0.4 mg total) under the tongue every 5 (five)  minutes as needed for chest pain (3 doses MAX). Reported on 12/23/2015 25 tablet 1  . sitaGLIPtin (JANUVIA) 50 MG tablet TAKE 1 TABLET (50 MG TOTAL) BY MOUTH DAILY. 90 tablet 3   No facility-administered medications prior to visit.      Allergies:   Patient has no known allergies.   Social History   Social History  . Marital status: Widowed    Spouse name: N/A  . Number of children: N/A  . Years of education: N/A   Social History Main Topics  . Smoking status: Current Every Day Smoker    Packs/day: 0.50    Years: 53.00    Types: Cigarettes  . Smokeless tobacco: Never Used     Comment: smoker since age 89 pt has not had cigarette since 09/11/14.  Marland Kitchen Alcohol use No  . Drug use: No  . Sexual activity: Yes    Birth control/ protection: Post-menopausal   Other Topics Concern  . Not on file   Social History Narrative  . No narrative on file     Family History:  The patient's family history includes Anuerysm in her mother; Stroke in her father.   ROS:   Please see the history of present illness.    ROS All other systems reviewed and are negative.   PHYSICAL EXAM:   VS:  There were no vitals taken for this visit.   GEN: Well nourished, well developed, in no acute distressobese HEENT: normal Neck: no JVD, bilateral carotid bruits, or masses Cardiac: RRR; no murmurs, rubs, or gallops,no edema. Edema in feet Respiratory:  clear to auscultation bilaterally, normal work of breathing GI: soft, nontender, nondistended, + BS MS: no deformity or atrophy Skin: warm and dry, no rash Neuro:  Alert and Oriented x 3, Strength and sensation are intact Psych: euthymic mood, full affect  Wt Readings from Last 3 Encounters:  03/26/16 255 lb 3.2 oz (115.8 kg)  01/05/16 258 lb 12.8 oz (117.4 kg)  12/23/15 257 lb (116.6 kg)      Studies/Labs Reviewed:   EKG:   01/05/16 NSR HR 70 poor R wave progression otherwise normal   Recent Labs: 12/23/2015: Platelets WILL FOLLOW 03/26/2016: ALT  8; BUN 16; Creatinine, Ser 1.60; Potassium 4.6; Sodium 144   Lipid Panel    Component Value Date/Time   CHOL 124 12/23/2015 1117   CHOL 130 10/30/2012 1615   TRIG 120 12/23/2015 1117   TRIG 175 (H) 11/02/2013 1526   TRIG 143 10/30/2012 1615   HDL 55 12/23/2015 1117   HDL 45 11/02/2013 1526   HDL 49 10/30/2012 1615   CHOLHDL 2.3 12/23/2015 1117   LDLCALC 45 12/23/2015 1117   LDLCALC 52 11/02/2013 1526   LDLCALC 52 10/30/2012 1615  Additional studies/ records that were reviewed today include:  2 DECHO: 12/04/2012 LV EF: 55% -  60% Study Conclusions - Left ventricle: The cavity size was normal. Wall thickness was increased in a pattern of mild LVH. Systolic function was normal. The estimated ejection fraction was in the range of 55% to 60%. Wall motion was normal; there were no regional wall motion abnormalities. Doppler parameters are consistent with abnormal left ventricular relaxation (grade 1 diastolic dysfunction). - Mitral valve: Calcified annulus. - Left atrium: The atrium was mildly dilated.   ASSESSMENT & PLAN:   CAD: stable. Continue plavix, statin and BB.   Carotid artery disease: s/p L CEA. She saw Dr. Scot Dock in 09/2015  f/u duplex needed will order   Chronic diastolic dysfunction: stable on lasix 20mg  daily.   HTN: BP well controlled on current regimen.   HLD: continue statin. LDL excellent at 45 on most recent lipid panel ordered by PCP in 11/2015.  DMT2: most recent HgA1c 6.8. Continue to follow with PCP.   Hypoxia: counseled on importance of smoking cessation. No longer on oxygen. Still smoking and doesn't think she can quit.    CKD: creat baseline ~ 1.4   Hx of resting sinus tachycardia: possibly 2/2 autonomic dysfunction (controlled on BB)  Medication Adjustments/Labs and Tests Ordered: Current medicines are reviewed at length with the patient today.  Concerns regarding medicines are outlined above.  Medication changes, Labs and  Tests ordered today are listed in the Patient Instructions below. There are no Patient Instructions on file for this visit.   Signed, Jenkins Rouge, MD  11/03/2016 2:18 PM    [facility], [place], [place]  [address] Phone: [phone]; Fax: [phone]

## 2016-11-08 ENCOUNTER — Ambulatory Visit: Payer: Medicare Other | Admitting: Cardiovascular Disease

## 2016-11-15 NOTE — Progress Notes (Signed)
Cardiology Office Note    Date:  11/18/2016   ID:  [name] [name], DOB 20-May-1936, MRN [id number]  PCP:  Wardell Honour, MD  Cardiologist:  Dr. Johnsie Cancel    3 month follow up  History of Present Illness:  [name] [name] is a 81 y.o. female with a history of CAD s/p BMS to RCA (2006), resting sinus tachycardia possibly 2/2 autonomic dysfunction (controlled on BB), HTN, bilateral carotid artery disease s/p L CEA (08/2014), continued tobacco abuse, DMT2, and CKD who presents to clinic for follow up.   She has bilateral carotid disease. 08/2014 had L CEA by Dr Doren Custard complicated by hypoxia. Residual 60-79% left disease. Seen by Dr Elsworth Soho etiology of post op hypoxia unclear. Does not want sleep study. CT no ILD. No obstruction on PFT;s despite smoking history .He thought a trial of diuretics was in order for history of diastolic dysfunction even though euvolemic on exam.    03/2015  stopped her lisinopril HCTZ and started Cozaar 100mg  daily and lasix 20mg . Plan was for repeat carotid duplex in 6 months and follow up with Dr. Scot Dock. She saw Dr. Scot Dock in 09/2015 and he planned to have [place] follow her residual right carotid artery disease.60-79%   Still smoking 1 pack every 3 days.  Showed me a picture of her 30 lb Das[place]and   Past Medical History:  Diagnosis Date  . Angina 1995  . Anxiety   . Bruit   . CAD   . Carotid artery occlusion   . Cough   . Depression   . DM   . Edema   . HTN (hypertension)   . Hyperlipidemia   . Myocardial infarction ([place]) 1995  . OBESITY   . PONV (postoperative nausea and vomiting)   . PVD   . Shortness of breath dyspnea   . VENOUS INSUFFICIENCY     Past Surgical History:  Procedure Laterality Date  . CATARACT EXTRACTION W/PHACO  11/01/2011   Procedure: CATARACT EXTRACTION PHACO AND INTRAOCULAR LENS PLACEMENT (IOC);  Surgeon: Tonny Branch, MD;  Location: [facility];  Service: Ophthalmology;  Laterality: Right;  CDE:12.21  . CATARACT EXTRACTION W/PHACO  01/31/2012     Procedure: CATARACT EXTRACTION PHACO AND INTRAOCULAR LENS PLACEMENT (IOC);  Surgeon: Tonny Branch, MD;  Location: [facility];  Service: Ophthalmology;  Laterality: Left;  CDE:11.77  . CORONARY ANGIOPLASTY WITH STENT PLACEMENT  2006  . ENDARTERECTOMY Left 09/12/2014   Procedure: LEFT CAROTID ARTERY ENDARTERECTOMY;  Surgeon: Angelia Mould, MD;  Location: [facility];  Service: Vascular;  Laterality: Left;  . EYE SURGERY    . EYE SURGERY Bilateral   . PATCH ANGIOPLASTY Left 09/12/2014   Procedure: WITH DACRON PATCH ANGIOPLASTY;  Surgeon: Angelia Mould, MD;  Location: [facility];  Service: Vascular;  Laterality: Left;    Current Medications: Outpatient Medications Prior to Visit  Medication Sig Dispense Refill  . diclofenac sodium (VOLTAREN) 1 % GEL Apply 2 g topically daily as needed. Reported on 12/23/2015    . glimepiride (AMARYL) 2 MG tablet TAKE 1 TABLET (2 MG TOTAL) BY MOUTH DAILY BEFORE BREAKFAST. 90 tablet 0  . ONE TOUCH ULTRA TEST test strip TEST TWICE A DAY 100 each 3  . sitaGLIPtin (JANUVIA) 50 MG tablet TAKE 1 TABLET (50 MG TOTAL) BY MOUTH DAILY. 90 tablet 3  . atorvastatin (LIPITOR) 80 MG tablet TAKE 1 TABLET (80 MG TOTAL) BY MOUTH DAILY. 90 tablet 3  . cilostazol (PLETAL) 100 MG tablet TAKE 1 TABLET (100  MG TOTAL) BY MOUTH 2 (TWO) TIMES DAILY. 180 tablet 3  . clopidogrel (PLAVIX) 75 MG tablet TAKE 1 TABLET (75 MG TOTAL) BY MOUTH DAILY. 90 tablet 3  . furosemide (LASIX) 20 MG tablet Take 1 tablet (20 mg total) by mouth daily. 90 tablet 3  . losartan (COZAAR) 100 MG tablet Take 1 tablet (100 mg total) by mouth daily. 90 tablet 3  . metoprolol succinate (TOPROL-XL) 50 MG 24 hr tablet TAKE 1 AND 1/2 TABLETS BY MOUTH 2 (TWO) TIMES DAILY. 270 tablet 3  . nitroGLYCERIN (NITROSTAT) 0.4 MG SL tablet Place 1 tablet (0.4 mg total) under the tongue every 5 (five) minutes as needed for chest pain (3 doses MAX). Reported on 12/23/2015 25 tablet 1  . LORazepam (ATIVAN) 0.5 MG tablet Take 1 tablet  (0.5 mg total) by mouth 2 (two) times daily as needed for anxiety. 30 tablet 2   No facility-administered medications prior to visit.      Allergies:   Patient has no known allergies.   Social History   Social History  . Marital status: Widowed    Spouse name: N/A  . Number of children: N/A  . Years of education: N/A   Social History Main Topics  . Smoking status: Current Every Day Smoker    Packs/day: 0.50    Years: 53.00    Types: Cigarettes  . Smokeless tobacco: Never Used     Comment: smoker since age 81 pt has not had cigarette since 09/11/14.  Marland Kitchen Alcohol use No  . Drug use: No  . Sexual activity: Yes    Birth control/ protection: Post-menopausal   Other Topics Concern  . None   Social History Narrative  . None     Family History:  The patient's family history includes Anuerysm in her mother; Stroke in her father.   ROS:   Please see the history of present illness.    ROS All other systems reviewed and are negative.   PHYSICAL EXAM:   VS:  BP 140/70   Pulse 74   Ht 5' (1.524 m)   Wt 251 lb 12.8 oz (114.2 kg)   SpO2 95%   BMI 49.18 kg/m    GEN: Well nourished, well developed, in no acute distressobese HEENT: normal Neck: no JVD, bilateral carotid bruits,   Cardiac: RRR; no murmurs, rubs, or gallops,no edema. Edema in feet Respiratory:  clear to auscultation bilaterally, normal work of breathing GI: soft, nontender, nondistended, + BS MS: no deformity or atrophy Skin: warm and dry, no rash Neuro:  Alert and Oriented x 3, Strength and sensation are intact Psych: euthymic mood, full affect Trace LE edema bilateral   Wt Readings from Last 3 Encounters:  11/18/16 251 lb 12.8 oz (114.2 kg)  03/26/16 255 lb 3.2 oz (115.8 kg)  01/05/16 258 lb 12.8 oz (117.4 kg)      Studies/Labs Reviewed:   EKG:   01/05/16 NSR HR 70 poor R wave progression otherwise normal  11/18/16  SR rate 67 normal other than isolated PVC   Recent Labs: 12/23/2015: Platelets WILL  FOLLOW 03/26/2016: ALT 8; BUN 16; Creatinine, Ser 1.60; Potassium 4.6; Sodium 144   Lipid Panel    Component Value Date/Time   CHOL 124 12/23/2015 1117   CHOL 130 10/30/2012 1615   TRIG 120 12/23/2015 1117   TRIG 175 (H) 11/02/2013 1526   TRIG 143 10/30/2012 1615   HDL 55 12/23/2015 1117   HDL 45 11/02/2013 1526   HDL 49  10/30/2012 1615   CHOLHDL 2.3 12/23/2015 1117   LDLCALC 45 12/23/2015 1117   LDLCALC 52 11/02/2013 1526   LDLCALC 52 10/30/2012 1615    Additional studies/ records that were reviewed today include:  2 DECHO: 12/04/2012 LV EF: 55% -  60% Study Conclusions - Left ventricle: The cavity size was normal. Wall thickness was increased in a pattern of mild LVH. Systolic function was normal. The estimated ejection fraction was in the range of 55% to 60%. Wall motion was normal; there were no regional wall motion abnormalities. Doppler parameters are consistent with abnormal left ventricular relaxation (grade 1 diastolic dysfunction). - Mitral valve: Calcified annulus. - Left atrium: The atrium was mildly dilated.   ASSESSMENT & PLAN:   CAD: stable. Continue plavix, statin and BB.   Carotid artery disease: s/p L CEA. She saw Dr. Scot Dock in 09/2015  f/u duplex needed will order   Chronic diastolic dysfunction: stable on lasix 20mg  daily.   HTN: BP well controlled on current regimen.   HLD: continue statin. LDL excellent at 45 on most recent lipid panel ordered by PCP in 11/2015.  DMT2: most recent HgA1c 6.8. Continue to follow with PCP.   Hypoxia: counseled on importance of smoking cessation. No longer on oxygen. Still smoking and doesn't think she can quit.    CKD: creat baseline ~ 1.4   Hx of resting sinus tachycardia: possibly 2/2 autonomic dysfunction (controlled on BB)  Medication Adjustments/Labs and Tests Ordered: Current medicines are reviewed at length with the patient today.  Concerns regarding medicines are outlined above.  Medication  changes, Labs and Tests ordered today are listed in the Patient Instructions below. Patient Instructions  Medication Instructions:  Your physician recommends that you continue on your current medications as directed. Please refer to the Current Medication list given to you today.  Labwork: NONE  Testing/Procedures: Your physician has requested that you have a carotid duplex. This test is an ultrasound of the carotid arteries in your neck. It looks at blood flow through these arteries that supply the brain with blood. Allow one hour for this exam. There are no restrictions or special instructions.  Follow-Up: Your physician wants you to follow-up in: 6 months with Dr. Johnsie Cancel. You will receive a reminder letter in the mail two months in advance. If you don't receive a letter, please call our office to schedule the follow-up appointment.   If you need a refill on your cardiac medications before your next appointment, please call your pharmacy.       Signed, Jenkins Rouge, MD  11/18/2016 9:42 AM    [facility] [address], [place], [place]  [address] Phone: [phone]; Fax: [phone]

## 2016-11-16 ENCOUNTER — Other Ambulatory Visit: Payer: Self-pay | Admitting: Family Medicine

## 2016-11-18 ENCOUNTER — Encounter: Payer: Self-pay | Admitting: Cardiovascular Disease

## 2016-11-18 ENCOUNTER — Ambulatory Visit (INDEPENDENT_AMBULATORY_CARE_PROVIDER_SITE_OTHER): Payer: Medicare Other | Admitting: Cardiovascular Disease

## 2016-11-18 VITALS — BP 140/70 | HR 74 | Ht 60.0 in | Wt 251.8 lb

## 2016-11-18 DIAGNOSIS — I6523 Occlusion and stenosis of bilateral carotid arteries: Secondary | ICD-10-CM

## 2016-11-18 MED ORDER — FUROSEMIDE 20 MG PO TABS: 20.0000 mg | ORAL_TABLET | Freq: Every day | ORAL | 3 refills | Status: DC

## 2016-11-18 MED ORDER — ATORVASTATIN CALCIUM 80 MG PO TABS: ORAL_TABLET | ORAL | 3 refills | Status: DC

## 2016-11-18 MED ORDER — CILOSTAZOL 100 MG PO TABS: ORAL_TABLET | ORAL | 3 refills | Status: DC

## 2016-11-18 MED ORDER — CLOPIDOGREL BISULFATE 75 MG PO TABS: ORAL_TABLET | ORAL | 3 refills | Status: DC

## 2016-11-18 MED ORDER — LOSARTAN POTASSIUM 100 MG PO TABS: 100.0000 mg | ORAL_TABLET | Freq: Every day | ORAL | 3 refills | Status: DC

## 2016-11-18 MED ORDER — NITROGLYCERIN 0.4 MG SL SUBL: 0.4000 mg | SUBLINGUAL_TABLET | SUBLINGUAL | 1 refills | Status: AC | PRN

## 2016-11-18 MED ORDER — METOPROLOL SUCCINATE ER 50 MG PO TB24: ORAL_TABLET | ORAL | 3 refills | Status: DC

## 2016-11-18 MED ORDER — LORAZEPAM 0.5 MG PO TABS: 0.5000 mg | ORAL_TABLET | Freq: Two times a day (BID) | ORAL | 1 refills | Status: DC | PRN

## 2016-11-18 NOTE — Patient Instructions (Addendum)
Medication Instructions:  Your physician recommends that you continue on your current medications as directed. Please refer to the Current Medication list given to you today.  Labwork: NONE  Testing/Procedures: Your physician has requested that you have a carotid duplex. This test is an ultrasound of the carotid arteries in your neck. It looks at blood flow through these arteries that supply the brain with blood. Allow one hour for this exam. There are no restrictions or special instructions.  Follow-Up: Your physician wants you to follow-up in: 6 months with Dr. Nishan. You will receive a reminder letter in the mail two months in advance. If you don't receive a letter, please call our office to schedule the follow-up appointment.   If you need a refill on your cardiac medications before your next appointment, please call your pharmacy.    

## 2016-12-11 IMAGING — CT CT CHEST HIGH RESOLUTION W/O CM
2 of 6 series · 12 of 36 positions shown, 15 images · non-contrast
Comparison: None.

CLINICAL DATA: Worsening shortness of breath, initial encounter.

EXAM:
CT CHEST WITHOUT CONTRAST
TECHNIQUE: Multidetector CT imaging of the chest was performed following the
standard protocol without intravenous contrast. High resolution
imaging of the lungs, as well as inspiratory and expiratory imaging,
was performed.

[Series 5: lung · axial · 0.66mm/px · z∈[-260,-44]mm · 9 of 55 slices shown, 12 images]
[im 6/55  mediastinal]
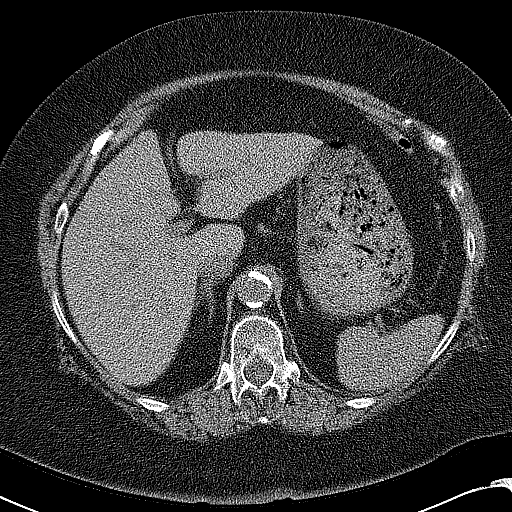
[im 6/55  lung]
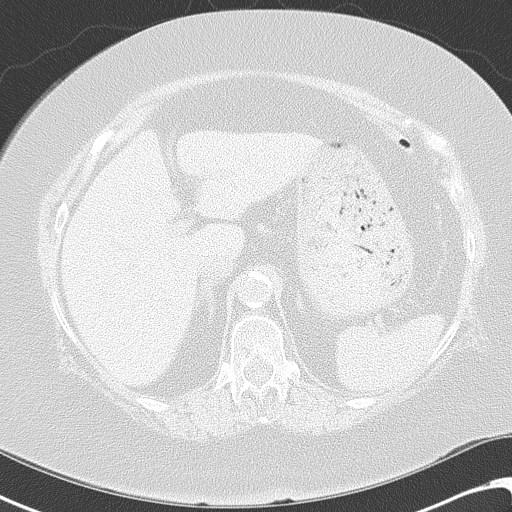
[im 11/55  lung]
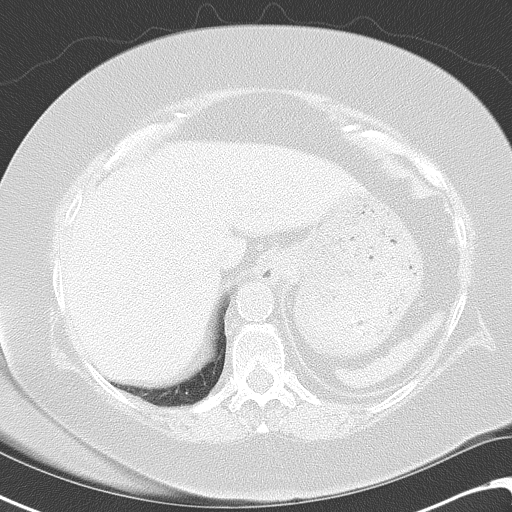
[im 17/55  lung]
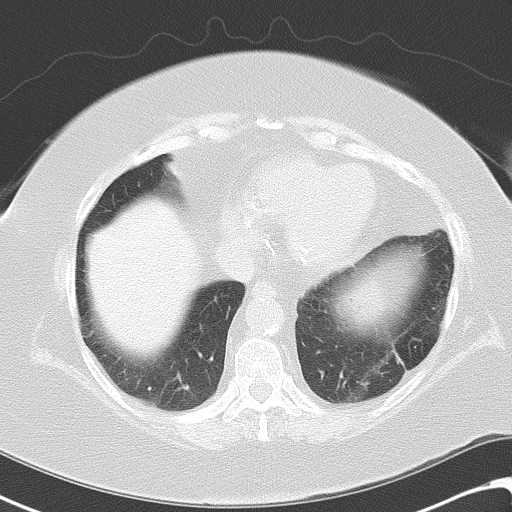
[im 22/55  lung]
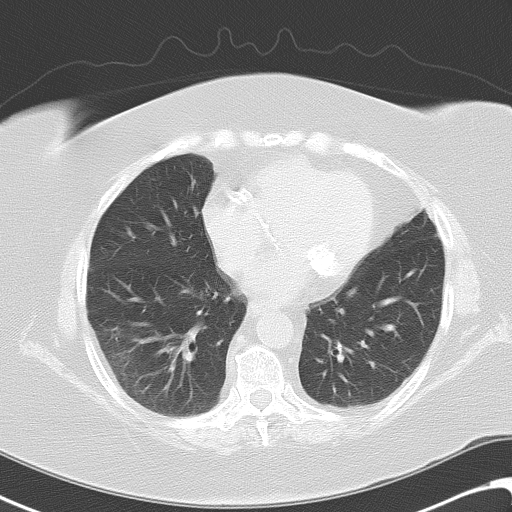
[im 28/55  mediastinal]
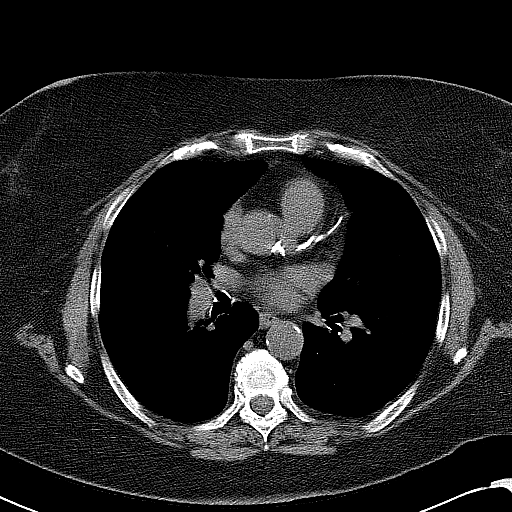
[im 28/55  lung]
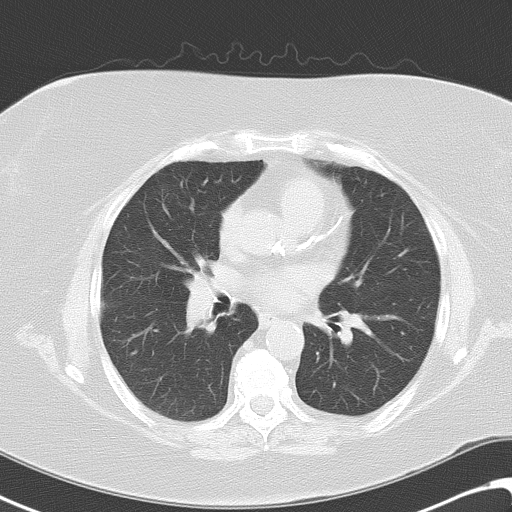
[im 33/55  lung]
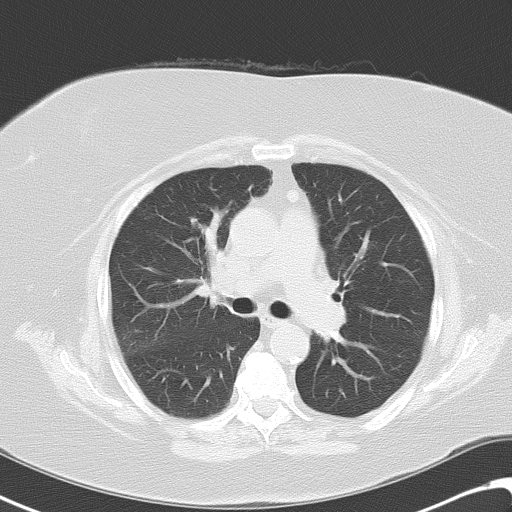
[im 38/55  lung]
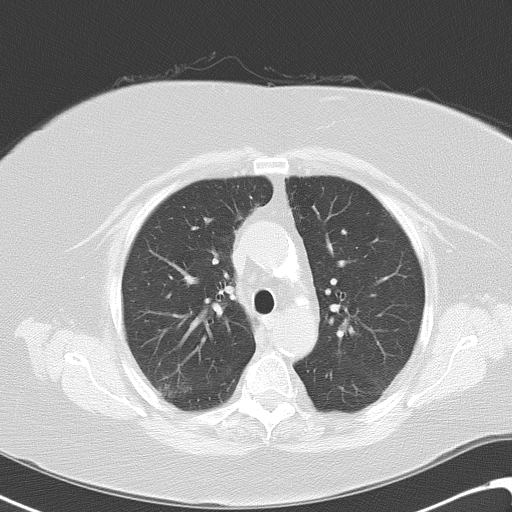
[im 44/55  lung]
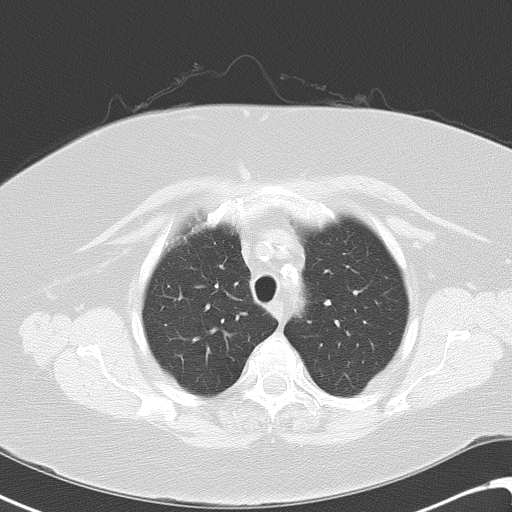
[im 49/55  mediastinal]
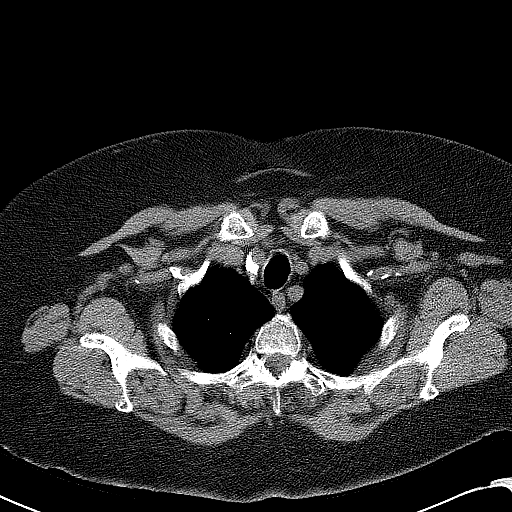
[im 49/55  lung]
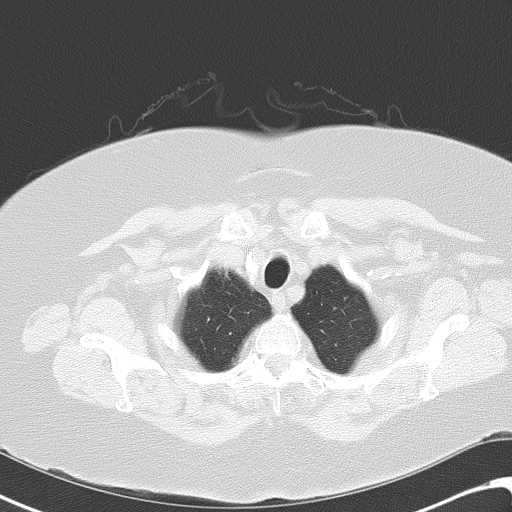

[Series 602: cor · coronal · 0.66mm/px · 3 of 108 slices shown]
[im 22/108  lung]
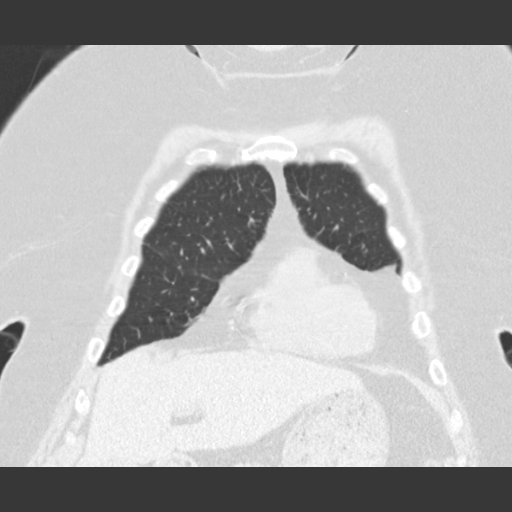
[im 43/108  lung]
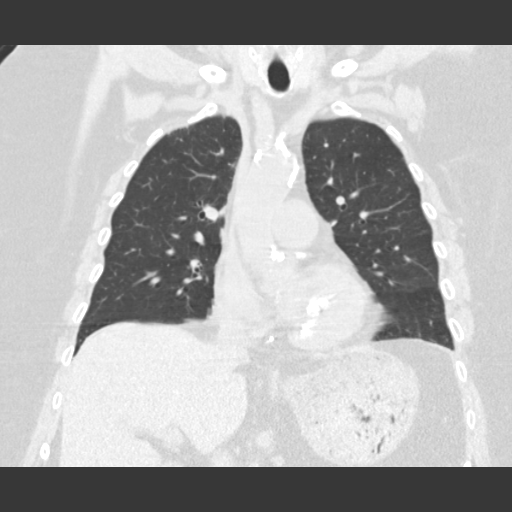
[im 65/108  lung]
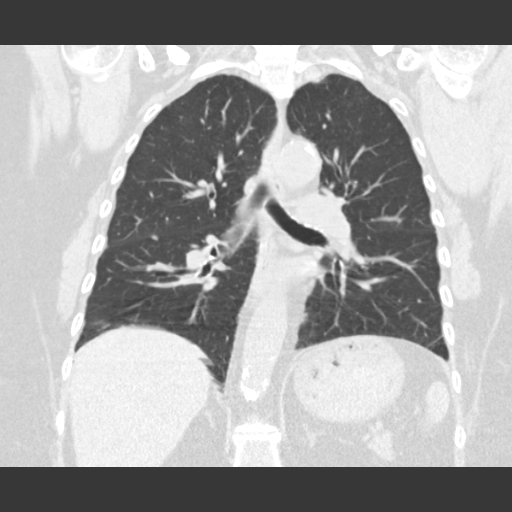

[12 of 36 positions shown; findings below may reference images not displayed]

FINDINGS: Mediastinum/Nodes: Mediastinal lymph nodes are not enlarged by CT
size criteria. Hilar regions are difficult to definitively evaluate
without IV contrast. No axillary adenopathy. Atherosclerotic
calcification of the arterial vasculature, including three-vessel
involvement of the coronary arteries. Mitral annulus calcification.
Heart size normal. No pericardial effusion.

Lungs/Pleura: Image quality is somewhat degraded at the lung bases
by respiratory motion. No definite subpleural reticulation, traction
bronchiectasis/bronchiolectasis, ground-glass, architectural
distortion or honeycombing. A few scattered pulmonary nodular
densities measure up to 4 mm in the subpleural right lower lobe.
Probable calcified granuloma in the right lower lobe. Linear
scarring in the left lower lobe. No pleural fluid. Airway is
unremarkable. No definite air trapping.

Upper abdomen: Visualized portions of the liver is unremarkable.
Stones are partially imaged in the gallbladder. Visualized portions
of the adrenal glands are unremarkable. 8 mm low-attenuation lesion
in the central aspect of the spleen is too small to characterize.
Visualized portions of the pancreas and stomach are unremarkable. No
upper abdominal adenopathy.

Musculoskeletal: No worrisome lytic or sclerotic lesions.
Degenerative changes are seen in the spine.
IMPRESSION: 1. No evidence of interstitial lung disease. No acute findings to
explain the patient's worsening shortness of breath.
2. Coronary artery calcification.
3. Scattered pulmonary nodular densities measure up to 4 mm. If the
patient is at high risk for bronchogenic carcinoma, follow-up chest
CT at 1 year is recommended. If the patient is at low risk, no
follow-up is needed. This recommendation follows the consensus
statement: Guidelines for Management of Small Pulmonary Nodules
Detected on CT Scans: A Statement from the [HOSPITAL] as
4. Gallstones.

## 2016-12-14 ENCOUNTER — Other Ambulatory Visit: Payer: Self-pay | Admitting: Family Medicine

## 2016-12-16 ENCOUNTER — Other Ambulatory Visit: Payer: Self-pay | Admitting: Family Medicine

## 2016-12-16 MED ORDER — SITAGLIPTIN PHOSPHATE 50 MG PO TABS: ORAL_TABLET | ORAL | 0 refills | Status: DC

## 2016-12-16 NOTE — Telephone Encounter (Signed)
done

## 2016-12-16 NOTE — Telephone Encounter (Signed)
What is the name of the medication? Janumet Patient has appt 6-13 with [facility]  Have you contacted your pharmacy to request a refill? YES  Which pharmacy would you like this sent to? [facility] in [place]   Patient notified that their request is being sent to the clinical staff for review and that they should receive a call once it is complete. If they do not receive a call within 24 hours they can check with their pharmacy or our office.

## 2017-01-05 ENCOUNTER — Ambulatory Visit: Payer: Medicare Other | Admitting: Family Medicine

## 2017-01-11 ENCOUNTER — Other Ambulatory Visit: Payer: Self-pay | Admitting: Family Medicine

## 2017-01-17 ENCOUNTER — Encounter: Payer: Self-pay | Admitting: Family Medicine

## 2017-01-17 ENCOUNTER — Ambulatory Visit (INDEPENDENT_AMBULATORY_CARE_PROVIDER_SITE_OTHER): Payer: Medicare Other | Admitting: Family Medicine

## 2017-01-17 VITALS — BP 115/78 | HR 83 | Temp 97.7°F | Ht 60.0 in | Wt 253.8 lb

## 2017-01-17 DIAGNOSIS — N183 Chronic kidney disease, stage 3 unspecified: Secondary | ICD-10-CM

## 2017-01-17 DIAGNOSIS — E782 Mixed hyperlipidemia: Secondary | ICD-10-CM | POA: Diagnosis not present

## 2017-01-17 DIAGNOSIS — F17200 Nicotine dependence, unspecified, uncomplicated: Secondary | ICD-10-CM | POA: Diagnosis not present

## 2017-01-17 DIAGNOSIS — R609 Edema, unspecified: Secondary | ICD-10-CM

## 2017-01-17 DIAGNOSIS — E1122 Type 2 diabetes mellitus with diabetic chronic kidney disease: Secondary | ICD-10-CM

## 2017-01-17 DIAGNOSIS — I6523 Occlusion and stenosis of bilateral carotid arteries: Secondary | ICD-10-CM

## 2017-01-17 DIAGNOSIS — R600 Localized edema: Secondary | ICD-10-CM

## 2017-01-17 DIAGNOSIS — I10 Essential (primary) hypertension: Secondary | ICD-10-CM | POA: Diagnosis not present

## 2017-01-17 LAB — BAYER DCA HB A1C WAIVED: HB A1C (BAYER DCA - WAIVED): 6.6 % (ref <7.0)

## 2017-01-17 NOTE — Progress Notes (Signed)
BP 115/78   Pulse 83   Temp 97.7 F (36.5 C) (Oral)   Ht 5' (1.524 m)   Wt 253 lb 12.8 oz (115.1 kg)   BMI 49.57 kg/m    Subjective:    Patient ID: [name] [name], female    DOB: 1935/12/09, 81 y.o.   MRN: [id number]  HPI: [name] [name] is a 81 y.o. female presenting on 01/17/2017 for Diabetes (due for lab work) and edema in lower extremities   HPI Type 2 diabetes mellitus Patient comes in today for recheck of his diabetes. Patient has been currently taking Januvia and glimepiride. Patient is currently on an ACE inhibitor/ARB. Patient has not seen an ophthalmologist this year. Patient denies any issues with their feet.   Hypertension Patient is currently on metoprolol and losartan added cilostazol, and their blood pressure today is 115/78. Patient denies any lightheadedness or dizziness. Patient denies headaches, blurred vision, chest pains, shortness of breath, or weakness. Denies any side effects from medication and is content with current medication.   Hyperlipidemia Patient is coming in for recheck of his hyperlipidemia. The patient is currently taking Lipitor. They deny any issues with myalgias or history of liver damage from it. They deny any focal numbness or weakness or chest pain.   Edema Patient comes in today complaining of worsening peripheral edema. She has been in both of her legs but is slightly worse than the left leg than the right. He has been fighting this for quite some time, at least 2 months that is been worse but it is been going on at least a few years that she's had the swelling. She denies any redness or warmth or drainage from anywhere on her leg. She denies any shortness of breath or chest pain.  Smoker Patient is a smoker and has noted her to quit at this point.  Relevant past medical, surgical, family and social history reviewed and updated as indicated. Interim medical history since our last visit reviewed. Allergies and medications reviewed and  updated.  Review of Systems  Constitutional: Negative for chills and fever.  HENT: Negative for congestion, ear discharge and ear pain.   Eyes: Negative for redness and visual disturbance.  Respiratory: Negative for chest tightness and shortness of breath.   Cardiovascular: Positive for leg swelling. Negative for chest pain.  Genitourinary: Negative for difficulty urinating and dysuria.  Musculoskeletal: Positive for arthralgias. Negative for back pain and gait problem.  Skin: Negative for rash.  Neurological: Negative for dizziness, light-headedness and headaches.  Psychiatric/Behavioral: Negative for agitation and behavioral problems.  All other systems reviewed and are negative.   Per HPI unless specifically indicated above     Objective:    BP 115/78   Pulse 83   Temp 97.7 F (36.5 C) (Oral)   Ht 5' (1.524 m)   Wt 253 lb 12.8 oz (115.1 kg)   BMI 49.57 kg/m   Wt Readings from Last 3 Encounters:  01/17/17 253 lb 12.8 oz (115.1 kg)  11/18/16 251 lb 12.8 oz (114.2 kg)  03/26/16 255 lb 3.2 oz (115.8 kg)    Physical Exam  Constitutional: She is oriented to person, place, and time. She appears well-developed and well-nourished. No distress.  Eyes: Conjunctivae are normal.  Neck: Neck supple. No thyromegaly present.  Cardiovascular: Normal rate, regular rhythm, normal heart sounds and intact distal pulses.   No murmur heard. Pulmonary/Chest: Effort normal. No respiratory distress. She has no wheezes. She has rales (Basilar crackles, patient is  asymptomatic commonly likely her baseline).  Musculoskeletal: Normal range of motion. She exhibits edema (2+ edema in left leg, 1+ right leg). She exhibits no tenderness.  Lymphadenopathy:    She has no cervical adenopathy.  Neurological: She is alert and oriented to person, place, and time. Coordination normal.  Skin: Skin is warm and dry. No rash noted. She is not diaphoretic.  Psychiatric: She has a normal mood and affect. Her  behavior is normal.  Nursing note and vitals reviewed.   Diabetic Foot Exam - Simple   Simple Foot Form Diabetic Foot exam was performed with the following findings:  Yes 01/17/2017  1:25 PM  Visual Inspection No deformities, no ulcerations, no other skin breakdown bilaterally:  Yes Sensation Testing Intact to touch and monofilament testing bilaterally:  Yes Pulse Check Posterior Tibialis and Dorsalis pulse intact bilaterally:  Yes Comments        Assessment & Plan:   Problem List Items Addressed This Visit      Cardiovascular and Mediastinum   Essential hypertension   Relevant Orders   CMP14+EGFR   CBC with Differential/Platelet   Microalbumin / creatinine urine ratio     Endocrine   DM (diabetes mellitus) type 2, uncontrolled, with ketoacidosis ([place])     Genitourinary   CKD (chronic kidney disease), stage III   Relevant Orders   CMP14+EGFR   CBC with Differential/Platelet     Other   Hyperlipidemia - Primary   Relevant Orders   Lipid panel   Morbid obesity ([place])   SMOKER   Relevant Orders   CBC with Differential/Platelet    Other Visit Diagnoses    Peripheral edema       Recommended Ace bandages to wrap them every day       Follow up plan: Return in about 3 months (around 04/19/2017), or if symptoms worsen or fail to improve, for Diabetes and kidney check.  Counseling provided for all of the vaccine components Orders Placed This Encounter  Procedures  . Bayer DCA Hb A1c Waived  . CMP14+EGFR  . Lipid panel  . CBC with Differential/Platelet    Caryl Pina, MD [facility] 01/17/2017, 1:10 PM

## 2017-01-18 LAB — CBC WITH DIFFERENTIAL/PLATELET
BASOS: 1 %
Basophils Absolute: 0.1 10*3/uL (ref 0.0–0.2)
EOS (ABSOLUTE): 0.4 10*3/uL (ref 0.0–0.4)
Eos: 4 %
HEMOGLOBIN: 12 g/dL (ref 11.1–15.9)
Hematocrit: 39 % (ref 34.0–46.6)
IMMATURE GRANS (ABS): 0 10*3/uL (ref 0.0–0.1)
IMMATURE GRANULOCYTES: 0 %
LYMPHS: 29 %
Lymphocytes Absolute: 2.9 10*3/uL (ref 0.7–3.1)
MCH: 30.4 pg (ref 26.6–33.0)
MCHC: 30.8 g/dL — ABNORMAL LOW (ref 31.5–35.7)
MCV: 99 fL — AB (ref 79–97)
MONOCYTES: 7 %
Monocytes Absolute: 0.7 10*3/uL (ref 0.1–0.9)
NEUTROS PCT: 59 %
Neutrophils Absolute: 5.9 10*3/uL (ref 1.4–7.0)
PLATELETS: 102 10*3/uL — AB (ref 150–379)
RBC: 3.95 x10E6/uL (ref 3.77–5.28)
RDW: 15.1 % (ref 12.3–15.4)
WBC: 9.9 10*3/uL (ref 3.4–10.8)

## 2017-01-18 LAB — CMP14+EGFR
A/G RATIO: 1.2 (ref 1.2–2.2)
ALT: 11 IU/L (ref 0–32)
AST: 15 IU/L (ref 0–40)
Albumin: 3.9 g/dL (ref 3.5–4.8)
Alkaline Phosphatase: 138 IU/L — ABNORMAL HIGH (ref 39–117)
BUN/Creatinine Ratio: 15 (ref 12–28)
BUN: 24 mg/dL (ref 8–27)
Bilirubin Total: 0.7 mg/dL (ref 0.0–1.2)
CALCIUM: 8.8 mg/dL (ref 8.7–10.3)
CO2: 24 mmol/L (ref 20–29)
Chloride: 103 mmol/L (ref 96–106)
Creatinine, Ser: 1.56 mg/dL — ABNORMAL HIGH (ref 0.57–1.00)
GFR, EST AFRICAN AMERICAN: 36 mL/min/{1.73_m2} — AB (ref >59)
GFR, EST NON AFRICAN AMERICAN: 32 mL/min/{1.73_m2} — AB (ref >59)
GLUCOSE: 137 mg/dL — AB (ref 65–99)
Globulin, Total: 3.3 g/dL (ref 1.5–4.5)
POTASSIUM: 4.7 mmol/L (ref 3.5–5.2)
Sodium: 144 mmol/L (ref 134–144)
TOTAL PROTEIN: 7.2 g/dL (ref 6.0–8.5)

## 2017-01-18 LAB — LIPID PANEL
Chol/HDL Ratio: 2.8 ratio (ref 0.0–4.4)
Cholesterol, Total: 130 mg/dL (ref 100–199)
HDL: 47 mg/dL (ref >39)
LDL Calculated: 59 mg/dL (ref 0–99)
TRIGLYCERIDES: 122 mg/dL (ref 0–149)
VLDL CHOLESTEROL CAL: 24 mg/dL (ref 5–40)

## 2017-01-18 LAB — MICROALBUMIN / CREATININE URINE RATIO
CREATININE, UR: 46.1 mg/dL
MICROALB/CREAT RATIO: 102.4 mg/g{creat} — AB (ref 0.0–30.0)
Microalbumin, Urine: 47.2 ug/mL

## 2017-02-21 ENCOUNTER — Other Ambulatory Visit: Payer: Self-pay

## 2017-03-17 ENCOUNTER — Other Ambulatory Visit: Payer: Self-pay | Admitting: Family Medicine

## 2017-04-13 ENCOUNTER — Other Ambulatory Visit: Payer: Self-pay | Admitting: Family Medicine

## 2017-05-06 ENCOUNTER — Other Ambulatory Visit: Payer: Self-pay

## 2017-05-06 DIAGNOSIS — I6523 Occlusion and stenosis of bilateral carotid arteries: Secondary | ICD-10-CM

## 2017-05-09 ENCOUNTER — Encounter: Payer: Self-pay | Admitting: Family Medicine

## 2017-05-09 ENCOUNTER — Ambulatory Visit (INDEPENDENT_AMBULATORY_CARE_PROVIDER_SITE_OTHER): Payer: Medicare Other | Admitting: Family Medicine

## 2017-05-09 VITALS — BP 178/74 | HR 79 | Temp 98.9°F | Ht 60.0 in | Wt 250.0 lb

## 2017-05-09 DIAGNOSIS — Z23 Encounter for immunization: Secondary | ICD-10-CM | POA: Diagnosis not present

## 2017-05-09 DIAGNOSIS — N3000 Acute cystitis without hematuria: Secondary | ICD-10-CM

## 2017-05-09 DIAGNOSIS — E1122 Type 2 diabetes mellitus with diabetic chronic kidney disease: Secondary | ICD-10-CM

## 2017-05-09 DIAGNOSIS — E1110 Type 2 diabetes mellitus with ketoacidosis without coma: Secondary | ICD-10-CM | POA: Diagnosis not present

## 2017-05-09 DIAGNOSIS — I6523 Occlusion and stenosis of bilateral carotid arteries: Secondary | ICD-10-CM | POA: Diagnosis not present

## 2017-05-09 DIAGNOSIS — N183 Chronic kidney disease, stage 3 unspecified: Secondary | ICD-10-CM

## 2017-05-09 DIAGNOSIS — E782 Mixed hyperlipidemia: Secondary | ICD-10-CM

## 2017-05-09 DIAGNOSIS — I10 Essential (primary) hypertension: Secondary | ICD-10-CM

## 2017-05-09 LAB — MICROSCOPIC EXAMINATION
Epithelial Cells (non renal): >10 /hpf — AB (ref 0–10)
Renal Epithel, UA: NONE SEEN /hpf

## 2017-05-09 LAB — URINALYSIS, COMPLETE
BILIRUBIN UA: NEGATIVE
GLUCOSE, UA: NEGATIVE
KETONES UA: NEGATIVE
Nitrite, UA: NEGATIVE
UUROB: 0.2 mg/dL (ref 0.2–1.0)
pH, UA: 5.5 (ref 5.0–7.5)

## 2017-05-09 LAB — BAYER DCA HB A1C WAIVED: HB A1C (BAYER DCA - WAIVED): 6.5 % (ref <7.0)

## 2017-05-09 MED ORDER — DICLOFENAC SODIUM 1 % TD GEL: 2.0000 g | Freq: Every day | TRANSDERMAL | 5 refills | Status: AC | PRN

## 2017-05-09 MED ORDER — SITAGLIPTIN PHOSPHATE 50 MG PO TABS: 50.0000 mg | ORAL_TABLET | Freq: Every day | ORAL | 1 refills | Status: DC

## 2017-05-09 MED ORDER — GLIMEPIRIDE 2 MG PO TABS: 2.0000 mg | ORAL_TABLET | Freq: Every day | ORAL | 1 refills | Status: DC

## 2017-05-09 NOTE — Progress Notes (Signed)
BP (!) 178/74   Pulse 79   Temp 98.9 F (37.2 C) (Oral)   Ht 5' (1.524 m)   Wt 250 lb (113.4 kg)   BMI 48.82 kg/m    Subjective:    Patient ID: [name] [name], female    DOB: 1936-05-25, 81 y.o.   MRN: [id number]  HPI: [name] [name] is a 81 y.o. female presenting on 05/09/2017 for Diabetes (follow up; patient is fasting); Hyperlipidemia; Hypertension; and Knee Pain (left, wants refill of Voltaren gel)   HPI Hypertension Patient is currently on metoprolol and losartan, and their blood pressure today is 178/74, she says her blood pressures been running better at home. Patient denies any lightheadedness or dizziness. Patient denies headaches, blurred vision, chest pains, shortness of breath, or weakness. Denies any side effects from medication and is content with current medication.   Type 2 diabetes mellitus Patient comes in today for recheck of his diabetes. Patient has been currently taking glimepiride and Januvia. Patient is currently on an ACE inhibitor/ARB. Patient has not seen an ophthalmologist this year. Patient has not had any issues with their feet.   Hyperlipidemia Patient is coming in for recheck of his hyperlipidemia. The patient is currently taking Lipitor 80 mg. They deny any issues with myalgias or history of liver damage from it. They deny any focal numbness or weakness or chest pain.   Chronic kidney disease and dysuria Patient has known chronic kidney disease that is stable on rechecks so far.  She denies any difficulty making urine or abdominal pain.  She does complain of having some difficulty with burning of urination today just started today and she wants to leave urine to see if there is anything wrong.  She denies any flank pain or abdominal pain or hematuria.  Relevant past medical, surgical, family and social history reviewed and updated as indicated. Interim medical history since our last visit reviewed. Allergies and medications reviewed and updated.  Review  of Systems  Constitutional: Negative for chills and fever.  HENT: Negative for congestion, ear discharge and ear pain.   Eyes: Negative for redness and visual disturbance.  Respiratory: Negative for chest tightness and shortness of breath.   Cardiovascular: Negative for chest pain and leg swelling.  Gastrointestinal: Negative for abdominal pain.  Genitourinary: Positive for dysuria and frequency. Negative for decreased urine volume, difficulty urinating, flank pain, hematuria and urgency.  Musculoskeletal: Negative for back pain and gait problem.  Skin: Negative for rash.  Neurological: Negative for dizziness, light-headedness and headaches.  Psychiatric/Behavioral: Negative for agitation and behavioral problems.  All other systems reviewed and are negative.   Per HPI unless specifically indicated above        Objective:    BP (!) 178/74   Pulse 79   Temp 98.9 F (37.2 C) (Oral)   Ht 5' (1.524 m)   Wt 250 lb (113.4 kg)   BMI 48.82 kg/m   Wt Readings from Last 3 Encounters:  05/09/17 250 lb (113.4 kg)  01/17/17 253 lb 12.8 oz (115.1 kg)  11/18/16 251 lb 12.8 oz (114.2 kg)    Physical Exam  Constitutional: She is oriented to person, place, and time. She appears well-developed and well-nourished. No distress.  Eyes: Conjunctivae are normal.  Neck: Neck supple.  Cardiovascular: Normal rate, regular rhythm, normal heart sounds and intact distal pulses.   No murmur heard. Pulmonary/Chest: Effort normal and breath sounds normal. No respiratory distress. She has no wheezes. She has no rales.  Abdominal:  Soft. Bowel sounds are normal. She exhibits no distension. There is no tenderness. There is no rebound.  Musculoskeletal: Normal range of motion. She exhibits edema (Trace).  Neurological: She is alert and oriented to person, place, and time. Coordination normal.  Skin: Skin is warm and dry. No rash noted. She is not diaphoretic.  Psychiatric: She has a normal mood and affect.  Her behavior is normal.  Nursing note and vitals reviewed.       Assessment & Plan:   Problem List Items Addressed This Visit      Cardiovascular and Mediastinum   Essential hypertension   Relevant Orders   CMP14+EGFR (Completed)     Endocrine   DM (diabetes mellitus) type 2, uncontrolled, with ketoacidosis ([place])   Relevant Medications   glimepiride (AMARYL) 2 MG tablet   sitaGLIPtin (JANUVIA) 50 MG tablet     Genitourinary   CKD (chronic kidney disease), stage III ([place])   Relevant Orders   CMP14+EGFR (Completed)   Urinalysis, Complete (Completed)     Other   Hyperlipidemia - Primary   Morbid obesity ([place])   Relevant Medications   glimepiride (AMARYL) 2 MG tablet   sitaGLIPtin (JANUVIA) 50 MG tablet    Other Visit Diagnoses    Need for immunization against influenza       Relevant Orders   Flu Vaccine QUAD 36+ mos IM (Completed)   Acute cystitis without hematuria           Follow up plan: Return in about 3 months (around 08/09/2017), or if symptoms worsen or fail to improve, for diabetes recheck.  Counseling provided for all of the vaccine components Orders Placed This Encounter  Procedures  . Bayer DCA Hb A1c Waived  . Holden Dettinger, MD [facility] 05/09/2017, 11:11 AM

## 2017-05-10 LAB — CMP14+EGFR
A/G RATIO: 1.2 (ref 1.2–2.2)
ALK PHOS: 144 IU/L — AB (ref 39–117)
ALT: 9 IU/L (ref 0–32)
AST: 13 IU/L (ref 0–40)
Albumin: 3.9 g/dL (ref 3.5–4.8)
BILIRUBIN TOTAL: 0.8 mg/dL (ref 0.0–1.2)
BUN / CREAT RATIO: 13 (ref 12–28)
BUN: 20 mg/dL (ref 8–27)
CALCIUM: 8.9 mg/dL (ref 8.7–10.3)
CO2: 22 mmol/L (ref 20–29)
Chloride: 103 mmol/L (ref 96–106)
Creatinine, Ser: 1.57 mg/dL — ABNORMAL HIGH (ref 0.57–1.00)
GFR calc non Af Amer: 31 mL/min/{1.73_m2} — ABNORMAL LOW (ref >59)
GFR, EST AFRICAN AMERICAN: 36 mL/min/{1.73_m2} — AB (ref >59)
GLOBULIN, TOTAL: 3.2 g/dL (ref 1.5–4.5)
Glucose: 152 mg/dL — ABNORMAL HIGH (ref 65–99)
POTASSIUM: 4.7 mmol/L (ref 3.5–5.2)
Sodium: 143 mmol/L (ref 134–144)
Total Protein: 7.1 g/dL (ref 6.0–8.5)

## 2017-05-10 MED ORDER — CEPHALEXIN 500 MG PO CAPS: 500.0000 mg | ORAL_CAPSULE | Freq: Four times a day (QID) | ORAL | 0 refills | Status: DC

## 2017-06-13 ENCOUNTER — Other Ambulatory Visit: Payer: Self-pay | Admitting: Family Medicine

## 2017-07-22 NOTE — Progress Notes (Signed)
Cardiology Office Note    Date:  08/01/2017   ID:  [name] [name], DOB May 24, 1936, MRN [id number]  PCP:  Dettinger, Fransisca Kaufmann, MD  Cardiologist:  Dr. Johnsie Cancel    3 month follow up  History of Present Illness:  [name] [name] is a 81 y.o. female with a history of CAD s/p BMS to RCA (2006), resting sinus tachycardia possibly 2/2 autonomic dysfunction (controlled on BB), HTN, bilateral carotid artery disease s/p L CEA (08/2014), continued tobacco abuse, DMT2, and CKD who presents to clinic for follow up.   She has bilateral carotid disease. 08/2014 had L CEA by Dr Doren Custard complicated by hypoxia. Residual 60-79% left disease. Seen by Dr Elsworth Soho etiology of post op hypoxia unclear.. CT no ILD. Non obstruction on PFT;s despite smoking history .He thought a trial of diuretics was in order for history of diastolic dysfunction even though euvolemic on exam.    03/2015  stopped her lisinopril HCTZ and started Cozaar 100mg  daily and lasix 20mg . She saw Dr. Scot Dock in 09/2015 and he planned to have [place] follow her residual right carotid artery disease.60-79%   Still smoking 1 pack every 3 days.  Lives with her  [place] loves dogs  No chest pain  Had CT chest 2016 but would likely benefit from repeat cancer screening given ongoing smoking   She does not want to have CXR/CT If I have cancer I don't want to know about it   Past Medical History:  Diagnosis Date  . Angina 1995  . Anxiety   . Bruit   . CAD   . Carotid artery occlusion   . Cough   . Depression   . DM   . Edema   . HTN (hypertension)   . Hyperlipidemia   . Myocardial infarction ([place]) 1995  . OBESITY   . PONV (postoperative nausea and vomiting)   . PVD   . Shortness of breath dyspnea   . VENOUS INSUFFICIENCY     Past Surgical History:  Procedure Laterality Date  . CATARACT EXTRACTION W/PHACO  11/01/2011   Procedure: CATARACT EXTRACTION PHACO AND INTRAOCULAR LENS PLACEMENT (IOC);  Surgeon: Tonny Branch, MD;  Location: [facility];  Service:  Ophthalmology;  Laterality: Right;  CDE:12.21  . CATARACT EXTRACTION W/PHACO  01/31/2012   Procedure: CATARACT EXTRACTION PHACO AND INTRAOCULAR LENS PLACEMENT (IOC);  Surgeon: Tonny Branch, MD;  Location: [facility];  Service: Ophthalmology;  Laterality: Left;  CDE:11.77  . CORONARY ANGIOPLASTY WITH STENT PLACEMENT  2006  . ENDARTERECTOMY Left 09/12/2014   Procedure: LEFT CAROTID ARTERY ENDARTERECTOMY;  Surgeon: Angelia Mould, MD;  Location: [facility];  Service: Vascular;  Laterality: Left;  . EYE SURGERY    . EYE SURGERY Bilateral   . PATCH ANGIOPLASTY Left 09/12/2014   Procedure: WITH DACRON PATCH ANGIOPLASTY;  Surgeon: Angelia Mould, MD;  Location: [facility];  Service: Vascular;  Laterality: Left;    Current Medications: Outpatient Medications Prior to Visit  Medication Sig Dispense Refill  . atorvastatin (LIPITOR) 80 MG tablet TAKE 1 TABLET (80 MG TOTAL) BY MOUTH DAILY. 90 tablet 3  . cilostazol (PLETAL) 100 MG tablet TAKE 1 TABLET (100 MG TOTAL) BY MOUTH 2 (TWO) TIMES DAILY. 180 tablet 3  . clopidogrel (PLAVIX) 75 MG tablet TAKE 1 TABLET (75 MG TOTAL) BY MOUTH DAILY. 90 tablet 3  . diclofenac sodium (VOLTAREN) 1 % GEL Apply 2 g topically daily as needed. Reported on 12/23/2015 100 g 5  . furosemide (LASIX) 20 MG tablet  Take 1 tablet (20 mg total) by mouth daily. 90 tablet 3  . glimepiride (AMARYL) 2 MG tablet Take 1 tablet (2 mg total) by mouth daily with breakfast. 90 tablet 1  . LORazepam (ATIVAN) 0.5 MG tablet Take 1 tablet (0.5 mg total) by mouth 2 (two) times daily as needed for anxiety. 60 tablet 1  . losartan (COZAAR) 100 MG tablet Take 1 tablet (100 mg total) by mouth daily. 90 tablet 3  . metoprolol succinate (TOPROL-XL) 50 MG 24 hr tablet TAKE 1 AND 1/2 TABLETS BY MOUTH 2 (TWO) TIMES DAILY. 270 tablet 3  . nitroGLYCERIN (NITROSTAT) 0.4 MG SL tablet Place 1 tablet (0.4 mg total) under the tongue every 5 (five) minutes as needed for chest pain (3 doses MAX). Reported on 12/23/2015 25  tablet 1  . ONE TOUCH ULTRA TEST test strip TEST TWICE A DAY 100 each 2  . sitaGLIPtin (JANUVIA) 50 MG tablet Take 1 tablet (50 mg total) by mouth daily. 90 tablet 1  . cephALEXin (KEFLEX) 500 MG capsule Take 1 capsule (500 mg total) by mouth 4 (four) times daily. (Patient not taking: Reported on 08/01/2017) 28 capsule 0   No facility-administered medications prior to visit.      Allergies:   Patient has no known allergies.   Social History   Socioeconomic History  . Marital status: Widowed    Spouse name: None  . Number of children: None  . Years of education: None  . Highest education level: None  Social Needs  . Financial resource strain: None  . Food insecurity - worry: None  . Food insecurity - inability: None  . Transportation needs - medical: None  . Transportation needs - non-medical: None  Occupational History  . None  Tobacco Use  . Smoking status: Current Every Day Smoker    Packs/day: 0.50    Years: 53.00    Pack years: 26.50    Types: Cigarettes  . Smokeless tobacco: Never Used  . Tobacco comment: smoker since age 28 pt has not had cigarette since 09/11/14.  Substance and Sexual Activity  . Alcohol use: No    Alcohol/week: 0.0 oz  . Drug use: No  . Sexual activity: Yes    Birth control/protection: Post-menopausal  Other Topics Concern  . None  Social History Narrative  . None     Family History:  The patient's family history includes Anuerysm in her mother; Stroke in her father.   ROS:   Please see the history of present illness.    ROS All other systems reviewed and are negative.   PHYSICAL EXAM:   VS:  BP (!) 162/82   Pulse 89   Ht 5' (1.524 m)   Wt 247 lb 4 oz (112.2 kg)   SpO2 93%   BMI 48.29 kg/m    Obese HEENT: normal  Neck: no JVD, bilateral carotid bruits,   Cardiac: RRR; no murmurs, rubs, or gallops,no edema. Edema in feet Respiratory:  clear to auscultation bilaterally, normal work of breathing GI: soft, nontender, nondistended, +  BS MS: no deformity or atrophy  Skin: warm and dry, no rash Neuro:  Alert and Oriented x 3, Strength and sensation are intact Psych: euthymic mood, full affect Plus one bilateral  LE edema    Wt Readings from Last 3 Encounters:  08/01/17 247 lb 4 oz (112.2 kg)  05/09/17 250 lb (113.4 kg)  01/17/17 253 lb 12.8 oz (115.1 kg)      Studies/Labs Reviewed:  EKG:   01/05/16 NSR HR 70 poor R wave progression otherwise normal  11/18/16  SR rate 67 normal other than isolated PVC   Recent Labs: 01/17/2017: Hemoglobin 12.0; Platelets 102 05/09/2017: ALT 9; BUN 20; Creatinine, Ser 1.57; Potassium 4.7; Sodium 143   Lipid Panel    Component Value Date/Time   CHOL 130 01/17/2017 1347   CHOL 130 10/30/2012 1615   TRIG 122 01/17/2017 1347   TRIG 175 (H) 11/02/2013 1526   TRIG 143 10/30/2012 1615   HDL 47 01/17/2017 1347   HDL 45 11/02/2013 1526   HDL 49 10/30/2012 1615   CHOLHDL 2.8 01/17/2017 1347   LDLCALC 59 01/17/2017 1347   LDLCALC 52 11/02/2013 1526   LDLCALC 52 10/30/2012 1615    Additional studies/ records that were reviewed today include:  2 DECHO: 12/04/2012 LV EF: 55% -  60% Study Conclusions - Left ventricle: The cavity size was normal. Wall thickness was increased in a pattern of mild LVH. Systolic function was normal. The estimated ejection fraction was in the range of 55% to 60%. Wall motion was normal; there were no regional wall motion abnormalities. Doppler parameters are consistent with abnormal left ventricular relaxation (grade 1 diastolic dysfunction). - Mitral valve: Calcified annulus. - Left atrium: The atrium was mildly dilated.   ASSESSMENT & PLAN:   CAD: stable. Continue plavix, statin and BB.   Carotid artery disease: s/p L CEA. She sees Doren Custard needs f/u duplex ordered in April not done will reorder   Chronic diastolic dysfunction: stable on lasix 20mg  daily.   HTN: BP well controlled on current regimen.   HLD: continue statin. LDL  excellent at 45 on most recent lipid panel ordered by PCP in 11/2015.  DMT2: most recent HgA1c 6.8. Continue to follow with PCP.   Hypoxia: counseled on importance of smoking cessation. No longer on oxygen. Still smoking and doesn't think she can quit.   Does not want to have CXR or lung cancer screening CT   CKD: creat baseline ~ 1.56    Hx of resting sinus tachycardia: possibly 2/2 autonomic dysfunction (controlled on BB)  Jenkins Rouge

## 2017-08-01 ENCOUNTER — Ambulatory Visit (INDEPENDENT_AMBULATORY_CARE_PROVIDER_SITE_OTHER): Payer: Medicare Other | Admitting: Cardiovascular Disease

## 2017-08-01 ENCOUNTER — Ambulatory Visit: Payer: Medicare Other | Admitting: Cardiovascular Disease

## 2017-08-01 ENCOUNTER — Encounter: Payer: Self-pay | Admitting: Cardiovascular Disease

## 2017-08-01 VITALS — BP 162/82 | HR 89 | Ht 60.0 in | Wt 247.2 lb

## 2017-08-01 DIAGNOSIS — I5189 Other ill-defined heart diseases: Secondary | ICD-10-CM

## 2017-08-01 DIAGNOSIS — E785 Hyperlipidemia, unspecified: Secondary | ICD-10-CM

## 2017-08-01 DIAGNOSIS — I10 Essential (primary) hypertension: Secondary | ICD-10-CM | POA: Diagnosis not present

## 2017-08-01 DIAGNOSIS — I519 Heart disease, unspecified: Secondary | ICD-10-CM

## 2017-08-01 DIAGNOSIS — R0902 Hypoxemia: Secondary | ICD-10-CM | POA: Diagnosis not present

## 2017-08-01 DIAGNOSIS — I6523 Occlusion and stenosis of bilateral carotid arteries: Secondary | ICD-10-CM | POA: Diagnosis not present

## 2017-08-01 DIAGNOSIS — I2510 Atherosclerotic heart disease of native coronary artery without angina pectoris: Secondary | ICD-10-CM | POA: Diagnosis not present

## 2017-08-01 NOTE — Patient Instructions (Addendum)

## 2017-08-03 ENCOUNTER — Ambulatory Visit (HOSPITAL_COMMUNITY)
Admission: RE | Admit: 2017-08-03 | Discharge: 2017-08-03 | Disposition: A | Discharge status: Home / Self Care | Payer: Medicare Other | Source: Ambulatory Visit | Attending: Vascular Surgery | Admitting: Vascular Surgery

## 2017-08-03 ENCOUNTER — Ambulatory Visit (INDEPENDENT_AMBULATORY_CARE_PROVIDER_SITE_OTHER): Payer: Medicare Other | Admitting: Vascular Surgery

## 2017-08-03 ENCOUNTER — Encounter: Payer: Self-pay | Admitting: Vascular Surgery

## 2017-08-03 VITALS — BP 159/65 | HR 80 | Temp 98.9°F | Resp 20 | Ht 60.0 in | Wt 247.0 lb

## 2017-08-03 DIAGNOSIS — I6523 Occlusion and stenosis of bilateral carotid arteries: Secondary | ICD-10-CM | POA: Diagnosis not present

## 2017-08-03 NOTE — Progress Notes (Signed)
Patient name: [name] [name] MRN: [id number] DOB: 1935/09/17 Sex: female  REASON FOR VISIT:   Follow-up of carotid disease.  HPI:   [name] [name] is a pleasant 82 y.o. female who I last saw on 10/01/2015.  She had a left carotid endarterectomy on 09/12/2014.  She comes in for a yearly follow-up visit.  He denies any history of stroke, TIAs, expressive or receptive aphasia, or amaurosis fugax.  She continues to smoke.  She is on Plavix and therefore does not take aspirin.  She is on a statin.  Past Medical History:  Diagnosis Date  . Angina 1995  . Anxiety   . Bruit   . CAD   . Carotid artery occlusion   . Cough   . Depression   . DM   . Edema   . HTN (hypertension)   . Hyperlipidemia   . Myocardial infarction ([place]) 1995  . OBESITY   . PONV (postoperative nausea and vomiting)   . PVD   . Shortness of breath dyspnea   . VENOUS INSUFFICIENCY     Family History  Problem Relation Age of Onset  . Anuerysm Mother   . Stroke Father   . Anesthesia problems Neg Hx   . Hypotension Neg Hx   . Malignant hyperthermia Neg Hx   . Pseudochol deficiency Neg Hx     SOCIAL HISTORY: Social History   Tobacco Use  . Smoking status: Current Every Day Smoker    Packs/day: 0.50    Years: 53.00    Pack years: 26.50    Types: Cigarettes  . Smokeless tobacco: Never Used  . Tobacco comment: smoker since age 68 pt has not had cigarette since 09/11/14.  Substance Use Topics  . Alcohol use: No    Alcohol/week: 0.0 oz    No Known Allergies  Current Outpatient Medications  Medication Sig Dispense Refill  . atorvastatin (LIPITOR) 80 MG tablet TAKE 1 TABLET (80 MG TOTAL) BY MOUTH DAILY. 90 tablet 3  . cilostazol (PLETAL) 100 MG tablet TAKE 1 TABLET (100 MG TOTAL) BY MOUTH 2 (TWO) TIMES DAILY. 180 tablet 3  . clopidogrel (PLAVIX) 75 MG tablet TAKE 1 TABLET (75 MG TOTAL) BY MOUTH DAILY. 90 tablet 3  . diclofenac sodium (VOLTAREN) 1 % GEL Apply 2 g topically daily as needed. Reported on  12/23/2015 100 g 5  . furosemide (LASIX) 20 MG tablet Take 1 tablet (20 mg total) by mouth daily. 90 tablet 3  . glimepiride (AMARYL) 2 MG tablet Take 1 tablet (2 mg total) by mouth daily with breakfast. 90 tablet 1  . LORazepam (ATIVAN) 0.5 MG tablet Take 1 tablet (0.5 mg total) by mouth 2 (two) times daily as needed for anxiety. 60 tablet 1  . losartan (COZAAR) 100 MG tablet Take 1 tablet (100 mg total) by mouth daily. 90 tablet 3  . metoprolol succinate (TOPROL-XL) 50 MG 24 hr tablet TAKE 1 AND 1/2 TABLETS BY MOUTH 2 (TWO) TIMES DAILY. 270 tablet 3  . nitroGLYCERIN (NITROSTAT) 0.4 MG SL tablet Place 1 tablet (0.4 mg total) under the tongue every 5 (five) minutes as needed for chest pain (3 doses MAX). Reported on 12/23/2015 25 tablet 1  . ONE TOUCH ULTRA TEST test strip TEST TWICE A DAY 100 each 2  . sitaGLIPtin (JANUVIA) 50 MG tablet Take 1 tablet (50 mg total) by mouth daily. 90 tablet 1   No current facility-administered medications for this visit.     REVIEW OF SYSTEMS:  [  X] denotes positive finding, [ ]  denotes negative finding Cardiac  Comments:  Chest pain or chest pressure:    Shortness of breath upon exertion:    Short of breath when lying flat:    Irregular heart rhythm:        Vascular    Pain in calf, thigh, or hip brought on by ambulation:    Pain in feet at night that wakes you up from your sleep:     Blood clot in your veins:    Leg swelling:         Pulmonary    Oxygen at home:    Productive cough:     Wheezing:         Neurologic    Sudden weakness in arms or legs:     Sudden numbness in arms or legs:     Sudden onset of difficulty speaking or slurred speech:    Temporary loss of vision in one eye:     Problems with dizziness:         Gastrointestinal    Blood in stool:     Vomited blood:         Genitourinary    Burning when urinating:     Blood in urine:        Psychiatric    Major depression:         Hematologic    Bleeding problems:      Problems with blood clotting too easily:        Skin    Rashes or ulcers:        Constitutional    Fever or chills:     PHYSICAL EXAM:   Vitals:   08/03/17 0950 08/03/17 0955  BP: 111/74 (!) 159/65  Pulse: 80   Resp: 20   Temp: 98.9 F (37.2 C)   TempSrc: Oral   SpO2: 92%   Weight: 247 lb (112 kg)   Height: 5' (1.524 m)     GENERAL: The patient is a well-nourished female, in no acute distress. The vital signs are documented above. CARDIAC: There is a regular rate and rhythm.  VASCULAR: She has bilateral carotid bruits. Both feet are warm and well-perfused. PULMONARY: There is good air exchange bilaterally without wheezing or rales. ABDOMEN: Soft and non-tender with normal pitched bowel sounds.  MUSCULOSKELETAL: There are no major deformities or cyanosis. NEUROLOGIC: No focal weakness or paresthesias are detected. SKIN: There are no ulcers or rashes noted. PSYCHIATRIC: The patient has a normal affect.  DATA:    CAROTID DUPLEX: I have independently interpreted her carotid duplex scan today.  She has a widely patent left carotid endarterectomy site without evidence of restenosis.  She has a 40-59% right carotid stenosis which is stable.  MEDICAL ISSUES:   STATUS POST LEFT CAROTID ENDARTERECTOMY: Patient is doing well status post left carotid endarterectomy from February 2016.  Her left carotid endarterectomy site is widely patent.  She has no significant stenosis on the right.  I have ordered a follow-up duplex scan in 1 year and I will see her back at that time.  She knows to call sooner if she has problems.  We have again discussed the importance of tobacco cessation however she does not feel that this is something she is interested in.  Deitra Mayo [facility] [phone]

## 2017-08-11 ENCOUNTER — Other Ambulatory Visit: Payer: Self-pay | Admitting: *Deleted

## 2017-08-11 MED ORDER — GLUCOSE BLOOD VI STRP: ORAL_STRIP | 3 refills | Status: DC

## 2017-08-11 NOTE — Telephone Encounter (Signed)
Fax received for new order Medicare Part B standard utilization guidelines Non-insulin dependent DM Sig test qd Max #100 strips/lancets q 55mos Insulin dependent DM Sig test qd / BID / TID Max #300 strips/lancets q 63mos  New Rx sent to [place]

## 2017-08-22 ENCOUNTER — Ambulatory Visit: Payer: Medicare Other | Admitting: Family Medicine

## 2017-09-22 ENCOUNTER — Other Ambulatory Visit: Payer: Self-pay | Admitting: *Deleted

## 2017-09-22 ENCOUNTER — Telehealth: Payer: Self-pay | Admitting: Family Medicine

## 2017-09-22 MED ORDER — GLUCOSE BLOOD VI STRP: ORAL_STRIP | 3 refills | Status: DC

## 2017-09-22 MED ORDER — FLUCONAZOLE 150 MG PO TABS: 150.0000 mg | ORAL_TABLET | Freq: Once | ORAL | 0 refills | Status: AC

## 2017-09-22 NOTE — Telephone Encounter (Signed)
Pt notified of RX 

## 2017-09-22 NOTE — Telephone Encounter (Signed)
Sent Diflucan for patient for vaginal irritation.

## 2017-09-22 NOTE — Telephone Encounter (Signed)
What symptoms do you have? Yeast infection vaginal itching  How long have you been sick? 2 days  Have you been seen for this problem? No states cannot come in  If your provider decides to give you a prescription, which pharmacy would you like for it to be sent to? [facility]   Patient informed that this information will be sent to the clinical staff for review and that they should receive a follow up call.

## 2017-09-22 NOTE — Telephone Encounter (Signed)
Fax received for new order Medicare Part B standard utilization guidelines Non-insulin dependent DM Sig test qd Max #100 strips/lancets q 83mos Insulin dependent DM Sig test qd / BID / TID Max #300 strips/lancets q 10mos  New Rx sent to [place]

## 2017-09-27 ENCOUNTER — Other Ambulatory Visit: Payer: Self-pay | Admitting: *Deleted

## 2017-09-27 MED ORDER — GLUCOSE BLOOD VI STRP: ORAL_STRIP | 3 refills | Status: AC

## 2017-09-27 NOTE — Telephone Encounter (Signed)
Fax received [facility] policy covers qd testing for non insulin pts & up to TID for insulin pts New Rx sent to pharmacy

## 2017-09-28 ENCOUNTER — Other Ambulatory Visit: Payer: Self-pay | Admitting: Cardiovascular Disease

## 2017-09-29 ENCOUNTER — Other Ambulatory Visit: Payer: Self-pay | Admitting: Family Medicine

## 2017-10-24 ENCOUNTER — Ambulatory Visit (INDEPENDENT_AMBULATORY_CARE_PROVIDER_SITE_OTHER): Payer: Medicare Other | Admitting: Family Medicine

## 2017-10-24 ENCOUNTER — Encounter: Payer: Self-pay | Admitting: Family Medicine

## 2017-10-24 DIAGNOSIS — E785 Hyperlipidemia, unspecified: Secondary | ICD-10-CM | POA: Diagnosis not present

## 2017-10-24 DIAGNOSIS — I6523 Occlusion and stenosis of bilateral carotid arteries: Secondary | ICD-10-CM

## 2017-10-24 DIAGNOSIS — E1159 Type 2 diabetes mellitus with other circulatory complications: Secondary | ICD-10-CM

## 2017-10-24 DIAGNOSIS — E1169 Type 2 diabetes mellitus with other specified complication: Secondary | ICD-10-CM

## 2017-10-24 DIAGNOSIS — N183 Chronic kidney disease, stage 3 unspecified: Secondary | ICD-10-CM

## 2017-10-24 DIAGNOSIS — I152 Hypertension secondary to endocrine disorders: Secondary | ICD-10-CM

## 2017-10-24 DIAGNOSIS — I10 Essential (primary) hypertension: Secondary | ICD-10-CM | POA: Diagnosis not present

## 2017-10-24 DIAGNOSIS — E1122 Type 2 diabetes mellitus with diabetic chronic kidney disease: Secondary | ICD-10-CM | POA: Diagnosis not present

## 2017-10-24 LAB — BAYER DCA HB A1C WAIVED: HB A1C (BAYER DCA - WAIVED): 6.2 % (ref <7.0)

## 2017-10-24 MED ORDER — SITAGLIPTIN PHOSPHATE 50 MG PO TABS: 50.0000 mg | ORAL_TABLET | Freq: Every day | ORAL | 1 refills | Status: DC

## 2017-10-24 MED ORDER — LORAZEPAM 0.5 MG PO TABS: 0.5000 mg | ORAL_TABLET | Freq: Every evening | ORAL | 0 refills | Status: DC | PRN

## 2017-10-24 MED ORDER — GLIMEPIRIDE 2 MG PO TABS: 2.0000 mg | ORAL_TABLET | Freq: Every day | ORAL | 1 refills | Status: DC

## 2017-10-24 NOTE — Progress Notes (Signed)
BP (!) 102/58   Pulse 75   Temp 98.1 F (36.7 C) (Oral)   Ht 5' (1.524 m)   Wt 241 lb (109.3 kg)   BMI 47.07 kg/m    Subjective:    Patient ID: [name] [name], female    DOB: Dec 02, 1935, 82 y.o.   MRN: [id number]  HPI: ERNESTYNE CALDWELL is a 82 y.o. female presenting on 10/24/2017 for Hyperlipidemia (6 mo); Diabetes; and Hypertension   HPI Type 2 diabetes mellitus Patient comes in today for recheck of his diabetes. Patient has been currently taking glimepiride and Januvia, she says she has had a couple of lows over the past 6 months down to 57 and 68 but it has not happened very consistently.. Patient is currently on an ACE inhibitor/ARB. Patient has not seen an ophthalmologist this year. Patient denies any issues with their feet.  Patient is morbidly obese and has chronic kidney disease associated with her diabetes which is been stable.  Hypertension Patient is currently on losartan and metoprolol, and their blood pressure today is 102/58. Patient denies any lightheadedness or dizziness. Patient denies headaches, blurred vision, chest pains, shortness of breath, or weakness. Denies any side effects from medication and is content with current medication.   Hyperlipidemia Patient is coming in for recheck of his hyperlipidemia. The patient is currently taking Lipitor. They deny any issues with myalgias or history of liver damage from it. They deny any focal numbness or weakness or chest pain.   Relevant past medical, surgical, family and social history reviewed and updated as indicated. Interim medical history since our last visit reviewed. Allergies and medications reviewed and updated.  Review of Systems  Constitutional: Negative for chills and fever.  Eyes: Negative for visual disturbance.  Respiratory: Negative for cough, shortness of breath and wheezing.   Cardiovascular: Negative for chest pain, palpitations and leg swelling.  Gastrointestinal: Negative for abdominal pain, blood in  stool, constipation and diarrhea.  Musculoskeletal: Negative for back pain and myalgias.  Skin: Negative for rash.  Neurological: Negative for dizziness, weakness and headaches.  Psychiatric/Behavioral: Negative for suicidal ideas.    Per HPI unless specifically indicated above   Allergies as of 10/24/2017   No Known Allergies     Medication List        Accurate as of 10/24/17 11:31 AM. Always use your most recent med list.          atorvastatin 80 MG tablet Commonly known as:  LIPITOR TAKE 1 TABLET (80 MG TOTAL) BY MOUTH DAILY.   cilostazol 100 MG tablet Commonly known as:  PLETAL TAKE 1 TABLET (100 MG TOTAL) BY MOUTH 2 (TWO) TIMES DAILY.   clopidogrel 75 MG tablet Commonly known as:  PLAVIX TAKE 1 TABLET (75 MG TOTAL) BY MOUTH DAILY.   diclofenac sodium 1 % Gel Commonly known as:  VOLTAREN Apply 2 g topically daily as needed. Reported on 12/23/2015   furosemide 20 MG tablet Commonly known as:  LASIX Take 1 tablet (20 mg total) by mouth daily.   glimepiride 2 MG tablet Commonly known as:  AMARYL Take 1 tablet (2 mg total) by mouth daily with breakfast.   glucose blood test strip Commonly known as:  ONE TOUCH ULTRA TEST Use to check blood sugars daily   LORazepam 0.5 MG tablet Commonly known as:  ATIVAN Take 1 tablet (0.5 mg total) by mouth 2 (two) times daily as needed for anxiety.   losartan 100 MG tablet Commonly known as:  COZAAR  Take 1 tablet (100 mg total) by mouth daily.   metoprolol succinate 50 MG 24 hr tablet Commonly known as:  TOPROL-XL TAKE 1 AND 1/2 TABLETS BY MOUTH 2 (TWO) TIMES DAILY.   nitroGLYCERIN 0.4 MG SL tablet Commonly known as:  NITROSTAT Place 1 tablet (0.4 mg total) under the tongue every 5 (five) minutes as needed for chest pain (3 doses MAX). Reported on 12/23/2015   sitaGLIPtin 50 MG tablet Commonly known as:  JANUVIA Take 1 tablet (50 mg total) by mouth daily.          Objective:    BP (!) 102/58   Pulse 75   Temp  98.1 F (36.7 C) (Oral)   Ht 5' (1.524 m)   Wt 241 lb (109.3 kg)   BMI 47.07 kg/m   Wt Readings from Last 3 Encounters:  10/24/17 241 lb (109.3 kg)  08/03/17 247 lb (112 kg)  08/01/17 247 lb 4 oz (112.2 kg)    Physical Exam  Constitutional: She is oriented to person, place, and time. She appears well-developed and well-nourished. No distress.  Eyes: Conjunctivae are normal.  Neck: Neck supple. No thyromegaly present.  Cardiovascular: Normal rate, regular rhythm, normal heart sounds and intact distal pulses.  No murmur heard. Pulmonary/Chest: Effort normal and breath sounds normal. No respiratory distress. She has no wheezes.  Musculoskeletal: Normal range of motion. She exhibits no edema or tenderness.  Lymphadenopathy:    She has no cervical adenopathy.  Neurological: She is alert and oriented to person, place, and time. Coordination normal.  Skin: Skin is warm and dry. No rash noted. She is not diaphoretic.  Psychiatric: She has a normal mood and affect. Her behavior is normal.  Nursing note and vitals reviewed.       Assessment & Plan:   Problem List Items Addressed This Visit      Cardiovascular and Mediastinum   Hypertension associated with diabetes ([place])   Relevant Medications   glimepiride (AMARYL) 2 MG tablet   sitaGLIPtin (JANUVIA) 50 MG tablet   Other Relevant Orders   CMP14+EGFR     Endocrine   Type 2 diabetes mellitus with other specified complication (HCC)   Relevant Medications   glimepiride (AMARYL) 2 MG tablet   sitaGLIPtin (JANUVIA) 50 MG tablet   Hyperlipidemia associated with type 2 diabetes mellitus (HCC)   Relevant Medications   glimepiride (AMARYL) 2 MG tablet   sitaGLIPtin (JANUVIA) 50 MG tablet   Other Relevant Orders   Lipid panel     Genitourinary   CKD (chronic kidney disease), stage III ([place])   Relevant Orders   CMP14+EGFR     Other   Morbid obesity ([place]) - Primary   Relevant Medications   glimepiride (AMARYL) 2 MG tablet    sitaGLIPtin (JANUVIA) 50 MG tablet   Other Relevant Orders   Lipid panel       Follow up plan: Return in about 3 months (around 01/23/2018), or if symptoms worsen or fail to improve, for Diabetes recheck.  Counseling provided for all of the vaccine components Orders Placed This Encounter  Procedures  . CMP14+EGFR  . Lipid panel  . [facility] Hb A1c [place], MD [facility] 10/24/2017, 11:31 AM

## 2017-10-25 LAB — CMP14+EGFR
ALK PHOS: 129 IU/L — AB (ref 39–117)
ALT: 8 IU/L (ref 0–32)
AST: 15 IU/L (ref 0–40)
Albumin/Globulin Ratio: 1.2 (ref 1.2–2.2)
Albumin: 3.7 g/dL (ref 3.5–4.8)
BILIRUBIN TOTAL: 0.6 mg/dL (ref 0.0–1.2)
BUN / CREAT RATIO: 14 (ref 12–28)
BUN: 23 mg/dL (ref 8–27)
CHLORIDE: 103 mmol/L (ref 96–106)
CO2: 21 mmol/L (ref 20–29)
CREATININE: 1.65 mg/dL — AB (ref 0.57–1.00)
Calcium: 8.8 mg/dL (ref 8.7–10.3)
GFR calc Af Amer: 34 mL/min/{1.73_m2} — ABNORMAL LOW (ref >59)
GFR calc non Af Amer: 29 mL/min/{1.73_m2} — ABNORMAL LOW (ref >59)
GLUCOSE: 109 mg/dL — AB (ref 65–99)
Globulin, Total: 3 g/dL (ref 1.5–4.5)
Potassium: 4.4 mmol/L (ref 3.5–5.2)
Sodium: 143 mmol/L (ref 134–144)
Total Protein: 6.7 g/dL (ref 6.0–8.5)

## 2017-10-25 LAB — LIPID PANEL
CHOLESTEROL TOTAL: 133 mg/dL (ref 100–199)
Chol/HDL Ratio: 3.2 ratio (ref 0.0–4.4)
HDL: 42 mg/dL (ref >39)
LDL CALC: 63 mg/dL (ref 0–99)
TRIGLYCERIDES: 138 mg/dL (ref 0–149)
VLDL Cholesterol Cal: 28 mg/dL (ref 5–40)

## 2017-11-25 ENCOUNTER — Other Ambulatory Visit: Payer: Self-pay | Admitting: Cardiovascular Disease

## 2017-11-26 ENCOUNTER — Other Ambulatory Visit: Payer: Self-pay | Admitting: Cardiovascular Disease

## 2017-11-29 ENCOUNTER — Other Ambulatory Visit: Payer: Self-pay | Admitting: Cardiovascular Disease

## 2017-12-20 ENCOUNTER — Other Ambulatory Visit: Payer: Self-pay | Admitting: Cardiovascular Disease

## 2017-12-31 ENCOUNTER — Other Ambulatory Visit: Payer: Self-pay | Admitting: Cardiovascular Disease

## 2018-01-29 ENCOUNTER — Other Ambulatory Visit: Payer: Self-pay | Admitting: Cardiovascular Disease

## 2018-02-23 ENCOUNTER — Ambulatory Visit: Payer: Medicare Other | Admitting: Family Medicine

## 2018-04-05 ENCOUNTER — Encounter: Payer: Self-pay | Admitting: Family Medicine

## 2018-04-05 ENCOUNTER — Ambulatory Visit (INDEPENDENT_AMBULATORY_CARE_PROVIDER_SITE_OTHER): Payer: Medicare Other | Admitting: Family Medicine

## 2018-04-05 VITALS — BP 116/70 | HR 65 | Temp 97.9°F | Ht 60.0 in | Wt 235.6 lb

## 2018-04-05 DIAGNOSIS — N183 Chronic kidney disease, stage 3 unspecified: Secondary | ICD-10-CM

## 2018-04-05 DIAGNOSIS — I6523 Occlusion and stenosis of bilateral carotid arteries: Secondary | ICD-10-CM

## 2018-04-05 DIAGNOSIS — E1122 Type 2 diabetes mellitus with diabetic chronic kidney disease: Secondary | ICD-10-CM | POA: Diagnosis not present

## 2018-04-05 DIAGNOSIS — E1159 Type 2 diabetes mellitus with other circulatory complications: Secondary | ICD-10-CM | POA: Diagnosis not present

## 2018-04-05 DIAGNOSIS — I152 Hypertension secondary to endocrine disorders: Secondary | ICD-10-CM

## 2018-04-05 DIAGNOSIS — E1169 Type 2 diabetes mellitus with other specified complication: Secondary | ICD-10-CM

## 2018-04-05 DIAGNOSIS — I10 Essential (primary) hypertension: Secondary | ICD-10-CM | POA: Diagnosis not present

## 2018-04-05 DIAGNOSIS — E785 Hyperlipidemia, unspecified: Secondary | ICD-10-CM | POA: Diagnosis not present

## 2018-04-05 LAB — BAYER DCA HB A1C WAIVED: HB A1C (BAYER DCA - WAIVED): 6.1 % (ref <7.0)

## 2018-04-05 MED ORDER — BLOOD GLUCOSE METER KIT: PACK | 0 refills | Status: DC

## 2018-04-05 MED ORDER — BLOOD GLUCOSE METER KIT: PACK | 0 refills | Status: AC

## 2018-04-05 MED ORDER — ATORVASTATIN CALCIUM 80 MG PO TABS: 80.0000 mg | ORAL_TABLET | Freq: Every day | ORAL | 3 refills | Status: AC

## 2018-04-05 MED ORDER — SITAGLIPTIN PHOSPHATE 50 MG PO TABS: 50.0000 mg | ORAL_TABLET | Freq: Every day | ORAL | 3 refills | Status: AC

## 2018-04-05 MED ORDER — FUROSEMIDE 20 MG PO TABS: 20.0000 mg | ORAL_TABLET | Freq: Every day | ORAL | 3 refills | Status: AC

## 2018-04-05 MED ORDER — LOSARTAN POTASSIUM 100 MG PO TABS: 100.0000 mg | ORAL_TABLET | Freq: Every day | ORAL | 3 refills | Status: AC

## 2018-04-05 MED ORDER — LORAZEPAM 0.5 MG PO TABS: 0.5000 mg | ORAL_TABLET | Freq: Every evening | ORAL | 0 refills | Status: DC | PRN

## 2018-04-05 NOTE — Progress Notes (Signed)
BP 116/70   Pulse 65   Temp 97.9 F (36.6 C) (Oral)   Ht 5' (1.524 m)   Wt 235 lb 9.6 oz (106.9 kg)   BMI 46.01 kg/m    Subjective:    Patient ID: [name] [name], female    DOB: [birth year], 82 y.o.   MRN: [id number]  HPI: [name] [name] is a 82 y.o. female presenting on 04/05/2018 for Diabetes (4 month follow up); Hypertension; and Hyperlipidemia   HPI Type 2 diabetes mellitus Patient comes in today for recheck of his diabetes. Patient has been currently taking glipizide and Januvia. Patient is currently on an ACE inhibitor/ARB. Patient has not seen an ophthalmologist this year. Patient denies any issues with their feet.  Has stage III renal disease which was slightly worse this last time, we will recheck it today.  She denies any issues with urination  Hyperlipidemia Patient is coming in for recheck of his hyperlipidemia. The patient is currently taking atorvastatin. They deny any issues with myalgias or history of liver damage from it. They deny any focal numbness or weakness or chest pain.   Hypertension Patient is currently on losartan and metoprolol, and their blood pressure today is 116/70. Patient denies any lightheadedness or dizziness. Patient denies headaches, blurred vision, chest pains, shortness of breath, or weakness. Denies any side effects from medication and is content with current medication.   Relevant past medical, surgical, family and social history reviewed and updated as indicated. Interim medical history since our last visit reviewed. Allergies and medications reviewed and updated.  Review of Systems  Constitutional: Negative for chills and fever.  Eyes: Negative for visual disturbance.  Respiratory: Negative for chest tightness and shortness of breath.   Cardiovascular: Negative for chest pain and leg swelling.  Musculoskeletal: Negative for back pain and gait problem.  Skin: Negative for rash.  Neurological: Negative for dizziness, light-headedness and  headaches.  Psychiatric/Behavioral: Negative for agitation and behavioral problems.  All other systems reviewed and are negative.   Per HPI unless specifically indicated above   Allergies as of 04/05/2018   No Known Allergies     Medication List        Accurate as of 04/05/18  2:12 PM. Always use your most recent med list.          atorvastatin 80 MG tablet Commonly known as:  LIPITOR TAKE 1 TABLET (80 MG TOTAL) BY MOUTH DAILY.   cilostazol 100 MG tablet Commonly known as:  PLETAL TAKE 1 TABLET (100 MG TOTAL) BY MOUTH 2 (TWO) TIMES DAILY.   clopidogrel 75 MG tablet Commonly known as:  PLAVIX TAKE 1 TABLET (75 MG TOTAL) BY MOUTH DAILY.   diclofenac sodium 1 % Gel Commonly known as:  VOLTAREN Apply 2 g topically daily as needed. Reported on 12/23/2015   furosemide 20 MG tablet Commonly known as:  LASIX TAKE 1 TABLET (20 MG TOTAL) BY MOUTH DAILY.   glimepiride 2 MG tablet Commonly known as:  AMARYL Take 1 tablet (2 mg total) by mouth daily with breakfast.   glucose blood test strip Use to check blood sugars daily   LORazepam 0.5 MG tablet Commonly known as:  ATIVAN Take 1 tablet (0.5 mg total) by mouth at bedtime as needed for anxiety. Give 90-day supply   losartan 100 MG tablet Commonly known as:  COZAAR TAKE 1 TABLET (100 MG TOTAL) BY MOUTH DAILY.   metoprolol succinate 50 MG 24 hr tablet Commonly known as:  TOPROL-XL TAKE 1  AND 1/2 TABLETS BY MOUTH 2 (TWO) TIMES DAILY.   nitroGLYCERIN 0.4 MG SL tablet Commonly known as:  NITROSTAT Place 1 tablet (0.4 mg total) under the tongue every 5 (five) minutes as needed for chest pain (3 doses MAX). Reported on 12/23/2015   sitaGLIPtin 50 MG tablet Commonly known as:  JANUVIA Take 1 tablet (50 mg total) by mouth daily.          Objective:    BP 116/70   Pulse 65   Temp 97.9 F (36.6 C) (Oral)   Ht 5' (1.524 m)   Wt 235 lb 9.6 oz (106.9 kg)   BMI 46.01 kg/m   Wt Readings from Last 3 Encounters:    04/05/18 235 lb 9.6 oz (106.9 kg)  10/24/17 241 lb (109.3 kg)  08/03/17 247 lb (112 kg)    Physical Exam  Constitutional: She is oriented to person, place, and time. She appears well-developed and well-nourished. No distress.  Eyes: Conjunctivae are normal.  Neck: Neck supple. No thyromegaly present.  Cardiovascular: Normal rate, regular rhythm, normal heart sounds and intact distal pulses.  No murmur heard. Pulmonary/Chest: Effort normal and breath sounds normal. No respiratory distress. She has no wheezes.  Musculoskeletal: Normal range of motion. She exhibits no edema or tenderness.  Lymphadenopathy:    She has no cervical adenopathy.  Neurological: She is alert and oriented to person, place, and time. Coordination normal.  Skin: Skin is warm and dry. No rash noted. She is not diaphoretic.  Psychiatric: She has a normal mood and affect. Her behavior is normal.  Nursing note and vitals reviewed.       Assessment & Plan:   Problem List Items Addressed This Visit      Cardiovascular and Mediastinum   Hypertension associated with diabetes ([place])   Relevant Medications   sitaGLIPtin (JANUVIA) 50 MG tablet   atorvastatin (LIPITOR) 80 MG tablet   furosemide (LASIX) 20 MG tablet   losartan (COZAAR) 100 MG tablet     Endocrine   Type 2 diabetes mellitus with other specified complication (HCC) - Primary   Relevant Medications   sitaGLIPtin (JANUVIA) 50 MG tablet   atorvastatin (LIPITOR) 80 MG tablet   losartan (COZAAR) 100 MG tablet   Other Relevant Orders   Microalbumin / creatinine urine ratio   Bayer DCA Hb A1c Waived (Completed)   CMP14+EGFR (Completed)   Hyperlipidemia associated with type 2 diabetes mellitus (HCC)   Relevant Medications   sitaGLIPtin (JANUVIA) 50 MG tablet   atorvastatin (LIPITOR) 80 MG tablet   furosemide (LASIX) 20 MG tablet   losartan (COZAAR) 100 MG tablet     Genitourinary   CKD (chronic kidney disease), stage III (HCC)     Other   Morbid  obesity (HCC)   Relevant Medications   sitaGLIPtin (JANUVIA) 50 MG tablet    Other Visit Diagnoses    Type 2 diabetes mellitus with stage 3 chronic kidney disease, without long-term current use of insulin (HCC)       Relevant Medications   sitaGLIPtin (JANUVIA) 50 MG tablet   atorvastatin (LIPITOR) 80 MG tablet   losartan (COZAAR) 100 MG tablet   Other Relevant Orders   Microalbumin / creatinine urine ratio   Bayer DCA Hb A1c Waived (Completed)   CMP14+EGFR (Completed)       Follow up plan: Return in about 4 months (around 08/05/2018), or if symptoms worsen or fail to improve, for Hypertension and diabetes recheck.  Counseling provided for all of the  vaccine components No orders of the defined types were placed in this encounter.   Caryl Pina, MD [facility] 04/05/2018, 2:12 PM

## 2018-04-06 LAB — CMP14+EGFR
ALK PHOS: 145 IU/L — AB (ref 39–117)
ALT: 10 IU/L (ref 0–32)
AST: 20 IU/L (ref 0–40)
Albumin/Globulin Ratio: 1.1 — ABNORMAL LOW (ref 1.2–2.2)
Albumin: 3.6 g/dL (ref 3.5–4.8)
BILIRUBIN TOTAL: 0.8 mg/dL (ref 0.0–1.2)
BUN / CREAT RATIO: 11 — AB (ref 12–28)
BUN: 15 mg/dL (ref 8–27)
CHLORIDE: 106 mmol/L (ref 96–106)
CO2: 19 mmol/L — AB (ref 20–29)
CREATININE: 1.36 mg/dL — AB (ref 0.57–1.00)
Calcium: 9 mg/dL (ref 8.7–10.3)
GFR calc Af Amer: 43 mL/min/{1.73_m2} — ABNORMAL LOW (ref >59)
GFR calc non Af Amer: 37 mL/min/{1.73_m2} — ABNORMAL LOW (ref >59)
GLOBULIN, TOTAL: 3.3 g/dL (ref 1.5–4.5)
GLUCOSE: 111 mg/dL — AB (ref 65–99)
POTASSIUM: 4.7 mmol/L (ref 3.5–5.2)
SODIUM: 142 mmol/L (ref 134–144)
Total Protein: 6.9 g/dL (ref 6.0–8.5)

## 2018-04-06 LAB — MICROALBUMIN / CREATININE URINE RATIO
CREATININE, UR: 45.7 mg/dL
MICROALB/CREAT RATIO: 128.4 mg/g{creat} — AB (ref 0.0–30.0)
Microalbumin, Urine: 58.7 ug/mL

## 2018-04-06 MED ORDER — ONETOUCH ULTRA 2 W/DEVICE KIT: 1.0000 | PACK | Freq: Two times a day (BID) | 1 refills | Status: DC

## 2018-04-11 ENCOUNTER — Telehealth: Payer: Self-pay | Admitting: *Deleted

## 2018-04-11 MED ORDER — ONETOUCH ULTRA 2 W/DEVICE KIT: PACK | 1 refills | Status: AC

## 2018-04-11 NOTE — Telephone Encounter (Signed)
Rx sent to pharmacy   

## 2018-04-24 ENCOUNTER — Other Ambulatory Visit: Payer: Self-pay | Admitting: Family Medicine

## 2018-04-24 MED ORDER — LORAZEPAM 0.5 MG PO TABS: 0.5000 mg | ORAL_TABLET | Freq: Every evening | ORAL | 0 refills | Status: DC | PRN

## 2018-04-24 NOTE — Progress Notes (Signed)
Sent in prescription refill because he has missed his last visit, she needs to be seen for next refill.

## 2018-07-17 ENCOUNTER — Other Ambulatory Visit: Payer: Self-pay | Admitting: Cardiovascular Disease

## 2018-08-02 ENCOUNTER — Emergency Department (HOSPITAL_COMMUNITY): Payer: Medicare Other

## 2018-08-02 ENCOUNTER — Other Ambulatory Visit: Payer: Self-pay

## 2018-08-02 ENCOUNTER — Inpatient Hospital Stay (HOSPITAL_COMMUNITY)
Admission: EM | Admit: 2018-08-02 | Discharge: 2018-08-10 | DRG: 191 | Disposition: A | Discharge status: Skilled Nursing Facility | Payer: Medicare Other | Attending: Internal Medicine | Admitting: Internal Medicine

## 2018-08-02 ENCOUNTER — Encounter (HOSPITAL_COMMUNITY): Payer: Self-pay

## 2018-08-02 DIAGNOSIS — E1165 Type 2 diabetes mellitus with hyperglycemia: Secondary | ICD-10-CM | POA: Diagnosis present

## 2018-08-02 DIAGNOSIS — F17200 Nicotine dependence, unspecified, uncomplicated: Secondary | ICD-10-CM | POA: Diagnosis present

## 2018-08-02 DIAGNOSIS — L8990 Pressure ulcer of unspecified site, unspecified stage: Secondary | ICD-10-CM

## 2018-08-02 DIAGNOSIS — E876 Hypokalemia: Secondary | ICD-10-CM | POA: Diagnosis present

## 2018-08-02 DIAGNOSIS — N183 Chronic kidney disease, stage 3 unspecified: Secondary | ICD-10-CM | POA: Diagnosis present

## 2018-08-02 DIAGNOSIS — I10 Essential (primary) hypertension: Secondary | ICD-10-CM

## 2018-08-02 DIAGNOSIS — R7881 Bacteremia: Secondary | ICD-10-CM | POA: Diagnosis present

## 2018-08-02 DIAGNOSIS — E1122 Type 2 diabetes mellitus with diabetic chronic kidney disease: Secondary | ICD-10-CM | POA: Diagnosis present

## 2018-08-02 DIAGNOSIS — L89326 Pressure-induced deep tissue damage of left buttock: Secondary | ICD-10-CM | POA: Diagnosis present

## 2018-08-02 DIAGNOSIS — F329 Major depressive disorder, single episode, unspecified: Secondary | ICD-10-CM | POA: Diagnosis present

## 2018-08-02 DIAGNOSIS — Z95828 Presence of other vascular implants and grafts: Secondary | ICD-10-CM

## 2018-08-02 DIAGNOSIS — Z716 Tobacco abuse counseling: Secondary | ICD-10-CM

## 2018-08-02 DIAGNOSIS — E1159 Type 2 diabetes mellitus with other circulatory complications: Secondary | ICD-10-CM | POA: Diagnosis present

## 2018-08-02 DIAGNOSIS — Z9842 Cataract extraction status, left eye: Secondary | ICD-10-CM

## 2018-08-02 DIAGNOSIS — Z961 Presence of intraocular lens: Secondary | ICD-10-CM | POA: Diagnosis present

## 2018-08-02 DIAGNOSIS — I152 Hypertension secondary to endocrine disorders: Secondary | ICD-10-CM | POA: Diagnosis present

## 2018-08-02 DIAGNOSIS — I739 Peripheral vascular disease, unspecified: Secondary | ICD-10-CM | POA: Diagnosis present

## 2018-08-02 DIAGNOSIS — B9561 Methicillin susceptible Staphylococcus aureus infection as the cause of diseases classified elsewhere: Secondary | ICD-10-CM | POA: Diagnosis present

## 2018-08-02 DIAGNOSIS — E785 Hyperlipidemia, unspecified: Secondary | ICD-10-CM | POA: Diagnosis present

## 2018-08-02 DIAGNOSIS — D631 Anemia in chronic kidney disease: Secondary | ICD-10-CM | POA: Diagnosis present

## 2018-08-02 DIAGNOSIS — Z7902 Long term (current) use of antithrombotics/antiplatelets: Secondary | ICD-10-CM

## 2018-08-02 DIAGNOSIS — Z823 Family history of stroke: Secondary | ICD-10-CM

## 2018-08-02 DIAGNOSIS — E1151 Type 2 diabetes mellitus with diabetic peripheral angiopathy without gangrene: Secondary | ICD-10-CM | POA: Diagnosis present

## 2018-08-02 DIAGNOSIS — F419 Anxiety disorder, unspecified: Secondary | ICD-10-CM | POA: Diagnosis present

## 2018-08-02 DIAGNOSIS — Z6841 Body Mass Index (BMI) 40.0 and over, adult: Secondary | ICD-10-CM

## 2018-08-02 DIAGNOSIS — Z955 Presence of coronary angioplasty implant and graft: Secondary | ICD-10-CM

## 2018-08-02 DIAGNOSIS — Z79899 Other long term (current) drug therapy: Secondary | ICD-10-CM

## 2018-08-02 DIAGNOSIS — I872 Venous insufficiency (chronic) (peripheral): Secondary | ICD-10-CM | POA: Diagnosis present

## 2018-08-02 DIAGNOSIS — R0902 Hypoxemia: Secondary | ICD-10-CM

## 2018-08-02 DIAGNOSIS — B9562 Methicillin resistant Staphylococcus aureus infection as the cause of diseases classified elsewhere: Secondary | ICD-10-CM | POA: Diagnosis present

## 2018-08-02 DIAGNOSIS — R531 Weakness: Secondary | ICD-10-CM | POA: Diagnosis present

## 2018-08-02 DIAGNOSIS — I2510 Atherosclerotic heart disease of native coronary artery without angina pectoris: Secondary | ICD-10-CM | POA: Diagnosis present

## 2018-08-02 DIAGNOSIS — F17210 Nicotine dependence, cigarettes, uncomplicated: Secondary | ICD-10-CM | POA: Diagnosis present

## 2018-08-02 DIAGNOSIS — W19XXXA Unspecified fall, initial encounter: Secondary | ICD-10-CM | POA: Diagnosis present

## 2018-08-02 DIAGNOSIS — I252 Old myocardial infarction: Secondary | ICD-10-CM

## 2018-08-02 DIAGNOSIS — J441 Chronic obstructive pulmonary disease with (acute) exacerbation: Principal | ICD-10-CM | POA: Diagnosis present

## 2018-08-02 DIAGNOSIS — Z7984 Long term (current) use of oral hypoglycemic drugs: Secondary | ICD-10-CM

## 2018-08-02 DIAGNOSIS — A419 Sepsis, unspecified organism: Secondary | ICD-10-CM

## 2018-08-02 DIAGNOSIS — E1169 Type 2 diabetes mellitus with other specified complication: Secondary | ICD-10-CM | POA: Diagnosis present

## 2018-08-02 DIAGNOSIS — Z9841 Cataract extraction status, right eye: Secondary | ICD-10-CM

## 2018-08-02 LAB — CBC WITH DIFFERENTIAL/PLATELET
Abs Immature Granulocytes: 0.1 10*3/uL — ABNORMAL HIGH (ref 0.00–0.07)
Basophils Absolute: 0 10*3/uL (ref 0.0–0.1)
Basophils Relative: 0 %
Eosinophils Absolute: 0 10*3/uL (ref 0.0–0.5)
Eosinophils Relative: 0 %
HCT: 31.6 % — ABNORMAL LOW (ref 36.0–46.0)
HEMOGLOBIN: 9.3 g/dL — AB (ref 12.0–15.0)
IMMATURE GRANULOCYTES: 1 %
LYMPHS PCT: 4 %
Lymphs Abs: 0.5 10*3/uL — ABNORMAL LOW (ref 0.7–4.0)
MCH: 29.8 pg (ref 26.0–34.0)
MCHC: 29.4 g/dL — ABNORMAL LOW (ref 30.0–36.0)
MCV: 101.3 fL — ABNORMAL HIGH (ref 80.0–100.0)
Monocytes Absolute: 0.4 10*3/uL (ref 0.1–1.0)
Monocytes Relative: 3 %
NEUTROS ABS: 12.4 10*3/uL — AB (ref 1.7–7.7)
NEUTROS PCT: 92 %
Platelets: 132 10*3/uL — ABNORMAL LOW (ref 150–400)
RBC: 3.12 MIL/uL — ABNORMAL LOW (ref 3.87–5.11)
RDW: 15.5 % (ref 11.5–15.5)
WBC: 13.5 10*3/uL — ABNORMAL HIGH (ref 4.0–10.5)
nRBC: 0 % (ref 0.0–0.2)

## 2018-08-02 LAB — COMPREHENSIVE METABOLIC PANEL
ALT: 14 U/L (ref 0–44)
ANION GAP: 8 (ref 5–15)
AST: 24 U/L (ref 15–41)
Albumin: 2.7 g/dL — ABNORMAL LOW (ref 3.5–5.0)
Alkaline Phosphatase: 111 U/L (ref 38–126)
BUN: 19 mg/dL (ref 8–23)
CO2: 26 mmol/L (ref 22–32)
Calcium: 7.9 mg/dL — ABNORMAL LOW (ref 8.9–10.3)
Chloride: 104 mmol/L (ref 98–111)
Creatinine, Ser: 1.65 mg/dL — ABNORMAL HIGH (ref 0.44–1.00)
GFR calc Af Amer: 34 mL/min — ABNORMAL LOW (ref >60)
GFR calc non Af Amer: 29 mL/min — ABNORMAL LOW (ref >60)
Glucose, Bld: 104 mg/dL — ABNORMAL HIGH (ref 70–99)
POTASSIUM: 3.2 mmol/L — AB (ref 3.5–5.1)
Sodium: 138 mmol/L (ref 135–145)
Total Bilirubin: 0.8 mg/dL (ref 0.3–1.2)
Total Protein: 6.8 g/dL (ref 6.5–8.1)

## 2018-08-02 LAB — I-STAT CG4 LACTIC ACID, ED: Lactic Acid, Venous: 2.12 mmol/L (ref 0.5–1.9)

## 2018-08-02 LAB — BRAIN NATRIURETIC PEPTIDE: B Natriuretic Peptide: 275 pg/mL — ABNORMAL HIGH (ref 0.0–100.0)

## 2018-08-02 MED ORDER — VANCOMYCIN HCL 1000 MG IV SOLR
INTRAVENOUS | Status: AC
  Filled 2018-08-02: qty 2000

## 2018-08-02 MED ORDER — VANCOMYCIN HCL IN DEXTROSE 1-5 GM/200ML-% IV SOLN: 1000.0000 mg | Freq: Once | INTRAVENOUS | Status: DC

## 2018-08-02 MED ORDER — VANCOMYCIN HCL 10 G IV SOLR
1250.0000 mg | INTRAVENOUS | Status: DC
  Filled 2018-08-02: qty 1250

## 2018-08-02 MED ORDER — METRONIDAZOLE IN NACL 5-0.79 MG/ML-% IV SOLN
500.0000 mg | Freq: Three times a day (TID) | INTRAVENOUS | Status: DC
  Administered 2018-08-03: 500 mg via INTRAVENOUS
  Filled 2018-08-02: qty 100

## 2018-08-02 MED ORDER — SODIUM CHLORIDE 0.9 % IV SOLN
1.0000 g | Freq: Three times a day (TID) | INTRAVENOUS | Status: DC
  Filled 2018-08-02 (×4): qty 1

## 2018-08-02 MED ORDER — ACETAMINOPHEN 500 MG PO TABS
1000.0000 mg | ORAL_TABLET | Freq: Once | ORAL | Status: AC
  Administered 2018-08-03: 1000 mg via ORAL
  Filled 2018-08-02: qty 2

## 2018-08-02 MED ORDER — SODIUM CHLORIDE 0.9 % IV SOLN
2.0000 g | Freq: Once | INTRAVENOUS | Status: AC
  Administered 2018-08-03: 2 g via INTRAVENOUS
  Filled 2018-08-02: qty 2

## 2018-08-02 MED ORDER — VANCOMYCIN HCL 10 G IV SOLR
2000.0000 mg | Freq: Once | INTRAVENOUS | Status: AC
  Administered 2018-08-03: 2000 mg via INTRAVENOUS
  Filled 2018-08-02: qty 2000

## 2018-08-02 NOTE — Progress Notes (Signed)
Pharmacy Antibiotic Note  [name] [name] is a 83 y.o. female admitted on 08/02/2018 with sepsis.  Pharmacy has been consulted for cefepime and vancomycin dosing.  Plan: vancomycin 2gm iv x 1   Vancomycin 1250mg  IV every 24 hours.  Goal trough 15-20 mcg/mL. cefepime 1gm iv q8h  Height: 5' (152.4 cm) Weight: 240 lb (108.9 kg) IBW/kg (Calculated) : 45.5  Temp (24hrs), Avg:102.6 F (39.2 C), Min:102.6 F (39.2 C), Max:102.6 F (39.2 C)  Recent Labs  Lab 08/02/18 1918 08/02/18 1929  WBC 13.5*  --   CREATININE 1.65*  --   LATICACIDVEN  --  2.12*    Estimated Creatinine Clearance: 30.4 mL/min (A) (by C-G formula based on SCr of 1.65 mg/dL (H)).    No Known Allergies  Antimicrobials this admission: 1/8 cefepime >>  1/8 vancomycin >>  1/8 metronidazole >>    Microbiology results: 1/8 BCx: sent   Thank you for allowing pharmacy to be a part of this patient's care.  Donna Christen Zahmir Lalla 08/02/2018 9:05 PM

## 2018-08-02 NOTE — ED Provider Notes (Signed)
Emergency Department Provider Note   I have reviewed the triage vital signs and the nursing notes.   HISTORY  Chief Complaint Weakness   HPI [name] [name] is a 83 y.o. female with PMH of HTN, HLD, CAD, and PVD presents to the emergency department for evaluation of generalized weakness with fever and shaking chills earlier today.  Patient states that around lunchtime today she began to have shaking chills and feel very poorly.  She denies any headaches, chest pain, shortness of breath, coughing.  She states that she took a Lorazepam which she typically does and the symptoms calm down.  After a little while she tried to get up but then "just went down" to the floor. She landed on her buttocks. Denies any head injury or LOC.  Is not experiencing buttock or back pain.  No pain in her legs or arms.  She was very weak and could not get up on her own.  She was ultimately found and EMS were called to noted hypoxemia. Patient does not typically wear supplemental O2 at home.    Past Medical History:  Diagnosis Date  . Angina 1995  . Anxiety   . Bruit   . CAD   . Carotid artery occlusion   . Cough   . Depression   . DM   . Edema   . HTN (hypertension)   . Hyperlipidemia   . Myocardial infarction ([place]) 1995  . OBESITY   . PONV (postoperative nausea and vomiting)   . PVD   . Shortness of breath dyspnea   . VENOUS INSUFFICIENCY     Patient Active Problem List   Diagnosis Date Noted  . COPD exacerbation ([place]) 08/03/2018  . Bilateral carotid artery stenosis 09/12/2014  . CKD (chronic kidney disease), stage III ([employer]) 11/02/2013  . SMOKER 05/30/2009  . Bilateral carotid bruits 05/30/2009  . Type 2 diabetes mellitus with other specified complication ([place]) 02/58/5277  . Hyperlipidemia associated with type 2 diabetes mellitus ([place]) 05/29/2009  . Morbid obesity ([place]) 05/29/2009  . Hypertension associated with diabetes ([place]) 05/29/2009  . Coronary atherosclerosis 05/29/2009  . PVD  05/29/2009  . VENOUS INSUFFICIENCY 05/29/2009  . Edema 05/29/2009    Past Surgical History:  Procedure Laterality Date  . CATARACT EXTRACTION W/PHACO  11/01/2011   Procedure: CATARACT EXTRACTION PHACO AND INTRAOCULAR LENS PLACEMENT (IOC);  Surgeon: Tonny Branch, MD;  Location: [facility];  Service: Ophthalmology;  Laterality: Right;  CDE:12.21  . CATARACT EXTRACTION W/PHACO  01/31/2012   Procedure: CATARACT EXTRACTION PHACO AND INTRAOCULAR LENS PLACEMENT (IOC);  Surgeon: Tonny Branch, MD;  Location: [facility];  Service: Ophthalmology;  Laterality: Left;  CDE:11.77  . CORONARY ANGIOPLASTY WITH STENT PLACEMENT  2006  . ENDARTERECTOMY Left 09/12/2014   Procedure: LEFT CAROTID ARTERY ENDARTERECTOMY;  Surgeon: Angelia Mould, MD;  Location: [facility];  Service: Vascular;  Laterality: Left;  . EYE SURGERY    . EYE SURGERY Bilateral   . PATCH ANGIOPLASTY Left 09/12/2014   Procedure: WITH DACRON PATCH ANGIOPLASTY;  Surgeon: Angelia Mould, MD;  Location: [facility];  Service: Vascular;  Laterality: Left;   Allergies Patient has no known allergies.  Family History  Problem Relation Age of Onset  . Anuerysm Mother   . Stroke Father   . Anesthesia problems Neg Hx   . Hypotension Neg Hx   . Malignant hyperthermia Neg Hx   . Pseudochol deficiency Neg Hx     Social History Social History   Tobacco Use  .  Smoking status: Current Every Day Smoker    Packs/day: 0.50    Years: 53.00    Pack years: 26.50    Types: Cigarettes  . Smokeless tobacco: Never Used  . Tobacco comment: smoker since age 36 pt has not had cigarette since 09/11/14.  Substance Use Topics  . Alcohol use: No    Alcohol/week: 0.0 standard drinks  . Drug use: No    Review of Systems  Constitutional: Positive fever/chills and generalized weakness.  Eyes: No visual changes. ENT: No sore throat. Cardiovascular: Denies chest pain. Respiratory: Denies shortness of breath. Gastrointestinal: No abdominal pain.  No nausea, no  vomiting.  No diarrhea.  No constipation. Genitourinary: Negative for dysuria, hesitancy, or urgency.  Musculoskeletal: Negative for back pain. Skin: Negative for rash. Neurological: Negative for headaches, focal weakness or numbness.  10-point ROS otherwise negative.  ____________________________________________   PHYSICAL EXAM:  VITAL SIGNS: ED Triage Vitals  Enc Vitals Group     BP 08/02/18 1915 (!) 136/55     Pulse Rate 08/02/18 1915 (!) 113     Resp 08/02/18 1915 (!) 24     Temp 08/02/18 1915 (!) 102.6 F (39.2 C)     Temp Source 08/02/18 1915 Oral     SpO2 08/02/18 1912 (!) 86 %     Weight 08/02/18 1917 240 lb (108.9 kg)     Height 08/02/18 1917 5' (1.524 m)     Pain Score 08/02/18 1916 10   Constitutional: Alert and oriented. Well appearing and in no acute distress. Eyes: Conjunctivae are normal. Head: Atraumatic. Nose: No congestion/rhinnorhea. Mouth/Throat: Mucous membranes are dry.  Neck: No stridor.   Cardiovascular: Tachycardia. Good peripheral circulation. Grossly normal heart sounds.   Respiratory: Normal respiratory effort.  No retractions. Lungs CTAB. Gastrointestinal: Soft and nontender. No distention.  Musculoskeletal: No lower extremity tenderness nor edema. No gross deformities of extremities. Neurologic:  Normal speech and language. No gross focal neurologic deficits are appreciated.  Skin:  Skin is warm, dry and intact. No rash noted.  ____________________________________________   LABS (all labs ordered are listed, but only abnormal results are displayed)  Labs Reviewed  COMPREHENSIVE METABOLIC PANEL - Abnormal; Notable for the following components:      Result Value   Potassium 3.2 (*)    Glucose, Bld 104 (*)    Creatinine, Ser 1.65 (*)    Calcium 7.9 (*)    Albumin 2.7 (*)    GFR calc non Af Amer 29 (*)    GFR calc Af Amer 34 (*)    All other components within normal limits  CBC WITH DIFFERENTIAL/PLATELET - Abnormal; Notable for the  following components:   WBC 13.5 (*)    RBC 3.12 (*)    Hemoglobin 9.3 (*)    HCT 31.6 (*)    MCV 101.3 (*)    MCHC 29.4 (*)    Platelets 132 (*)    Neutro Abs 12.4 (*)    Lymphs Abs 0.5 (*)    Abs Immature Granulocytes 0.10 (*)    All other components within normal limits  BRAIN NATRIURETIC PEPTIDE - Abnormal; Notable for the following components:   B Natriuretic Peptide 275.0 (*)    All other components within normal limits  COMPREHENSIVE METABOLIC PANEL - Abnormal; Notable for the following components:   Glucose, Bld 116 (*)    Creatinine, Ser 1.64 (*)    Calcium 7.6 (*)    Total Protein 6.3 (*)    Albumin 2.5 (*)  AST 66 (*)    GFR calc non Af Amer 29 (*)    GFR calc Af Amer 34 (*)    All other components within normal limits  CBC - Abnormal; Notable for the following components:   WBC 18.2 (*)    RBC 2.88 (*)    Hemoglobin 8.5 (*)    HCT 29.3 (*)    MCV 101.7 (*)    MCHC 29.0 (*)    RDW 15.6 (*)    Platelets 115 (*)    All other components within normal limits  HEMOGLOBIN A1C - Abnormal; Notable for the following components:   Hgb A1c MFr Bld 6.3 (*)    All other components within normal limits  GLUCOSE, CAPILLARY - Abnormal; Notable for the following components:   Glucose-Capillary 232 (*)    All other components within normal limits  I-STAT CG4 LACTIC ACID, ED - Abnormal; Notable for the following components:   Lactic Acid, Venous 2.12 (*)    All other components within normal limits  CULTURE, BLOOD (ROUTINE X 2)  CULTURE, BLOOD (ROUTINE X 2)  INFLUENZA PANEL BY PCR (TYPE A & B)  LACTIC ACID, PLASMA  URINALYSIS, ROUTINE W REFLEX MICROSCOPIC  I-STAT CG4 LACTIC ACID, ED   ____________________________________________  EKG   EKG Interpretation  Date/Time:  Wednesday August 02 2018 19:51:13 EST Ventricular Rate:  110 PR Interval:    QRS Duration: 92 QT Interval:  353 QTC Calculation: 478 R Axis:   2 Text Interpretation:  Sinus tachycardia  Borderline repolarization abnormality No STEMI.  Confirmed by Nanda Quinton [phone]) on 08/03/2018 12:54:55 PM       ____________________________________________  RADIOLOGY  Dg Chest Port 1 View  Result Date: 08/02/2018 CLINICAL DATA:  Fever EXAM: PORTABLE CHEST 1 VIEW COMPARISON:  09/13/2058 chest radiograph. FINDINGS: Stable cardiomediastinal silhouette with top-normal heart size. No pneumothorax. No pleural effusion. Lungs appear clear, with no acute consolidative airspace disease and no pulmonary edema. IMPRESSION: No active disease. Electronically Signed   By: Ilona Sorrel M.D.   On: 08/02/2018 20:07    ____________________________________________   PROCEDURES  Procedure(s) performed:   Procedures  CRITICAL CARE Performed by: Margette Fast Total critical care time: 35 minutes Critical care time was exclusive of separately billable procedures and treating other patients. Critical care was necessary to treat or prevent imminent or life-threatening deterioration. Critical care was time spent personally by me on the following activities: development of treatment plan with patient and/or surrogate as well as nursing, discussions with consultants, evaluation of patient's response to treatment, examination of patient, obtaining history from patient or surrogate, ordering and performing treatments and interventions, ordering and review of laboratory studies, ordering and review of radiographic studies, pulse oximetry and re-evaluation of patient's condition.  Nanda Quinton, MD Emergency Medicine  ____________________________________________   INITIAL IMPRESSION / ASSESSMENT AND PLAN / ED COURSE  Pertinent labs & imaging results that were available during my care of the patient were reviewed by me and considered in my medical decision making (see chart for details).  Patient presents to the emergency department for evaluation of generalized weakness after being found on the floor.  She  is hypoxemic and requiring supplemental oxygen but in no acute respiratory distress.  She has fever and tachycardia here.  Clinical suspicion for sepsis is elevated.  Chest x-ray pending.  Given her hypoxemia I suspect a pulmonary source but patient's main symptoms earlier today were rigors. Plan for IVF, Tylenol, labs, and reassess. Abx to follow shortly.  Patient with unremarkable CXR. Remains HDS. Labs with no clear source. No HA or neuro symptoms to suspect CNS source. Plan for admit for abx, cultures, and continued supportive care.   Discussed patient's case with Hospitalist to request admission. Patient and family (if present) updated with plan. Care transferred to Hospitalist service.  I reviewed all nursing notes, vitals, pertinent old records, EKGs, labs, imaging (as available).  ____________________________________________  FINAL CLINICAL IMPRESSION(S) / ED DIAGNOSES  Final diagnoses:  Sepsis, due to unspecified organism, unspecified whether acute organ dysfunction present ([place])  Hypoxemia     MEDICATIONS GIVEN DURING THIS VISIT:  Medications  vancomycin (VANCOCIN) 1,250 mg in sodium chloride 0.9 % 250 mL IVPB (has no administration in time range)  methylPREDNISolone sodium succinate (SOLU-MEDROL) 125 mg/2 mL injection 60 mg (60 mg Intravenous Given 08/03/18 1236)  ipratropium-albuterol (DUONEB) 0.5-2.5 (3) MG/3ML nebulizer solution 3 mL (3 mLs Nebulization Given 08/03/18 0846)  atorvastatin (LIPITOR) tablet 80 mg (has no administration in time range)  losartan (COZAAR) tablet 100 mg (100 mg Oral Given 08/03/18 0958)  metoprolol succinate (TOPROL-XL) 24 hr tablet 75 mg (75 mg Oral Given 08/03/18 0957)  LORazepam (ATIVAN) tablet 0.5 mg (has no administration in time range)  cilostazol (PLETAL) tablet 100 mg (100 mg Oral Given 08/03/18 0957)  clopidogrel (PLAVIX) tablet 75 mg (75 mg Oral Given 08/03/18 0957)  enoxaparin (LOVENOX) injection 40 mg (40 mg Subcutaneous Not Given 08/03/18 0446)    sodium chloride flush (NS) 0.9 % injection 3 mL (3 mLs Intravenous Given 08/03/18 1236)  sodium chloride flush (NS) 0.9 % injection 3 mL (has no administration in time range)  0.9 %  sodium chloride infusion (has no administration in time range)  insulin aspart (novoLOG) injection 0-9 Units (3 Units Subcutaneous Given 08/03/18 1236)  acetaminophen (TYLENOL) tablet 1,000 mg (1,000 mg Oral Given 08/03/18 0004)  ceFEPIme (MAXIPIME) 2 g in sodium chloride 0.9 % 100 mL IVPB (0 g Intravenous Stopped 08/03/18 0235)  vancomycin (VANCOCIN) 2,000 mg in sodium chloride 0.9 % 500 mL IVPB (0 mg Intravenous Stopped 08/03/18 0235)  LORazepam (ATIVAN) tablet 0.5 mg (0.5 mg Oral Given 08/03/18 0050)  potassium chloride 10 mEq in 100 mL IVPB (0 mEq Intravenous Stopped 08/03/18 0740)    Note:  This document was prepared using Dragon voice recognition software and may include unintentional dictation errors.  Nanda Quinton, MD Emergency Medicine    , [facility], MD 08/03/18 1256

## 2018-08-02 NOTE — ED Triage Notes (Signed)
Patient found on floor at home. Unable to get out of floor due to weakness. Patient complained of nausea and weakness. O2 sat was 82 percent on room air per EMS.

## 2018-08-03 ENCOUNTER — Observation Stay (HOSPITAL_BASED_OUTPATIENT_CLINIC_OR_DEPARTMENT_OTHER): Payer: Medicare Other

## 2018-08-03 ENCOUNTER — Encounter (HOSPITAL_COMMUNITY): Payer: Self-pay | Admitting: *Deleted

## 2018-08-03 ENCOUNTER — Ambulatory Visit: Payer: Medicare Other | Admitting: Family Medicine

## 2018-08-03 ENCOUNTER — Other Ambulatory Visit: Payer: Self-pay

## 2018-08-03 DIAGNOSIS — J441 Chronic obstructive pulmonary disease with (acute) exacerbation: Principal | ICD-10-CM

## 2018-08-03 DIAGNOSIS — R7881 Bacteremia: Secondary | ICD-10-CM | POA: Diagnosis present

## 2018-08-03 DIAGNOSIS — A419 Sepsis, unspecified organism: Secondary | ICD-10-CM | POA: Diagnosis not present

## 2018-08-03 LAB — BLOOD CULTURE ID PANEL (REFLEXED)

## 2018-08-03 LAB — GLUCOSE, CAPILLARY
Glucose-Capillary: 232 mg/dL — ABNORMAL HIGH (ref 70–99)
Glucose-Capillary: 251 mg/dL — ABNORMAL HIGH (ref 70–99)
Glucose-Capillary: 242 mg/dL — ABNORMAL HIGH (ref 70–99)

## 2018-08-03 LAB — COMPREHENSIVE METABOLIC PANEL
ALK PHOS: 89 U/L (ref 38–126)
ALT: 18 U/L (ref 0–44)
AST: 66 U/L — ABNORMAL HIGH (ref 15–41)
Albumin: 2.5 g/dL — ABNORMAL LOW (ref 3.5–5.0)
Anion gap: 7 (ref 5–15)
BILIRUBIN TOTAL: 1 mg/dL (ref 0.3–1.2)
BUN: 20 mg/dL (ref 8–23)
CO2: 23 mmol/L (ref 22–32)
CREATININE: 1.64 mg/dL — AB (ref 0.44–1.00)
Calcium: 7.6 mg/dL — ABNORMAL LOW (ref 8.9–10.3)
Chloride: 106 mmol/L (ref 98–111)
GFR calc Af Amer: 34 mL/min — ABNORMAL LOW (ref >60)
GFR calc non Af Amer: 29 mL/min — ABNORMAL LOW (ref >60)
Glucose, Bld: 116 mg/dL — ABNORMAL HIGH (ref 70–99)
Potassium: 3.7 mmol/L (ref 3.5–5.1)
Sodium: 136 mmol/L (ref 135–145)
TOTAL PROTEIN: 6.3 g/dL — AB (ref 6.5–8.1)

## 2018-08-03 LAB — CBC
HCT: 29.3 % — ABNORMAL LOW (ref 36.0–46.0)
Hemoglobin: 8.5 g/dL — ABNORMAL LOW (ref 12.0–15.0)
MCH: 29.5 pg (ref 26.0–34.0)
MCHC: 29 g/dL — ABNORMAL LOW (ref 30.0–36.0)
MCV: 101.7 fL — ABNORMAL HIGH (ref 80.0–100.0)
Platelets: 115 10*3/uL — ABNORMAL LOW (ref 150–400)
RBC: 2.88 MIL/uL — ABNORMAL LOW (ref 3.87–5.11)
RDW: 15.6 % — ABNORMAL HIGH (ref 11.5–15.5)
WBC: 18.2 10*3/uL — ABNORMAL HIGH (ref 4.0–10.5)
nRBC: 0 % (ref 0.0–0.2)

## 2018-08-03 LAB — INFLUENZA PANEL BY PCR (TYPE A & B)
Influenza A By PCR: NEGATIVE
Influenza B By PCR: NEGATIVE

## 2018-08-03 LAB — ECHOCARDIOGRAM COMPLETE
Height: 60 in
Weight: 3840 oz

## 2018-08-03 LAB — HEMOGLOBIN A1C
HEMOGLOBIN A1C: 6.3 % — AB (ref 4.8–5.6)
Mean Plasma Glucose: 134.11 mg/dL

## 2018-08-03 LAB — LACTIC ACID, PLASMA: Lactic Acid, Venous: 0.9 mmol/L (ref 0.5–1.9)

## 2018-08-03 MED ORDER — ENOXAPARIN SODIUM 40 MG/0.4ML ~~LOC~~ SOLN
40.0000 mg | SUBCUTANEOUS | Status: DC
  Administered 2018-08-04 – 2018-08-08 (×5): 40 mg via SUBCUTANEOUS
  Filled 2018-08-03 (×5): qty 0.4

## 2018-08-03 MED ORDER — LORAZEPAM 0.5 MG PO TABS
0.5000 mg | ORAL_TABLET | Freq: Once | ORAL | Status: AC
  Administered 2018-08-03: 0.5 mg via ORAL
  Filled 2018-08-03: qty 1

## 2018-08-03 MED ORDER — SODIUM CHLORIDE 0.9% FLUSH
3.0000 mL | Freq: Two times a day (BID) | INTRAVENOUS | Status: DC
  Administered 2018-08-03 – 2018-08-10 (×13): 3 mL via INTRAVENOUS

## 2018-08-03 MED ORDER — CLOPIDOGREL BISULFATE 75 MG PO TABS
75.0000 mg | ORAL_TABLET | Freq: Every day | ORAL | Status: DC
  Administered 2018-08-03 – 2018-08-10 (×8): 75 mg via ORAL
  Filled 2018-08-03 (×8): qty 1

## 2018-08-03 MED ORDER — LOSARTAN POTASSIUM 50 MG PO TABS
100.0000 mg | ORAL_TABLET | Freq: Every day | ORAL | Status: DC
  Administered 2018-08-03 – 2018-08-10 (×7): 100 mg via ORAL
  Filled 2018-08-03 (×8): qty 2

## 2018-08-03 MED ORDER — CILOSTAZOL 100 MG PO TABS
100.0000 mg | ORAL_TABLET | Freq: Two times a day (BID) | ORAL | Status: DC
  Administered 2018-08-03 – 2018-08-10 (×15): 100 mg via ORAL
  Filled 2018-08-03 (×17): qty 1

## 2018-08-03 MED ORDER — SODIUM CHLORIDE 0.9 % IV SOLN: 250.0000 mL | INTRAVENOUS | Status: DC | PRN

## 2018-08-03 MED ORDER — CEFAZOLIN SODIUM-DEXTROSE 2-4 GM/100ML-% IV SOLN
2.0000 g | Freq: Two times a day (BID) | INTRAVENOUS | Status: DC
  Administered 2018-08-03 – 2018-08-05 (×4): 2 g via INTRAVENOUS
  Filled 2018-08-03 (×9): qty 100

## 2018-08-03 MED ORDER — ATORVASTATIN CALCIUM 40 MG PO TABS
80.0000 mg | ORAL_TABLET | Freq: Every day | ORAL | Status: DC
  Administered 2018-08-03 – 2018-08-09 (×6): 80 mg via ORAL
  Filled 2018-08-03 (×6): qty 2

## 2018-08-03 MED ORDER — METHYLPREDNISOLONE SODIUM SUCC 125 MG IJ SOLR
60.0000 mg | Freq: Four times a day (QID) | INTRAMUSCULAR | Status: DC
  Administered 2018-08-03 – 2018-08-04 (×6): 60 mg via INTRAVENOUS
  Filled 2018-08-03 (×7): qty 2

## 2018-08-03 MED ORDER — METOPROLOL SUCCINATE ER 50 MG PO TB24
75.0000 mg | ORAL_TABLET | Freq: Two times a day (BID) | ORAL | Status: DC
  Administered 2018-08-03 – 2018-08-10 (×13): 75 mg via ORAL
  Filled 2018-08-03 (×14): qty 1

## 2018-08-03 MED ORDER — SODIUM CHLORIDE 0.9% FLUSH: 3.0000 mL | INTRAVENOUS | Status: DC | PRN

## 2018-08-03 MED ORDER — IPRATROPIUM-ALBUTEROL 0.5-2.5 (3) MG/3ML IN SOLN
3.0000 mL | Freq: Four times a day (QID) | RESPIRATORY_TRACT | Status: DC
  Administered 2018-08-03 – 2018-08-04 (×8): 3 mL via RESPIRATORY_TRACT
  Filled 2018-08-03 (×7): qty 3

## 2018-08-03 MED ORDER — POTASSIUM CHLORIDE 10 MEQ/100ML IV SOLN
10.0000 meq | INTRAVENOUS | Status: AC
  Administered 2018-08-03 (×3): 10 meq via INTRAVENOUS
  Filled 2018-08-03 (×3): qty 100

## 2018-08-03 MED ORDER — LORAZEPAM 0.5 MG PO TABS
0.5000 mg | ORAL_TABLET | Freq: Every evening | ORAL | Status: DC | PRN
  Administered 2018-08-03 – 2018-08-07 (×4): 0.5 mg via ORAL
  Filled 2018-08-03 (×4): qty 1

## 2018-08-03 MED ORDER — NYSTATIN 100000 UNIT/GM EX POWD
Freq: Three times a day (TID) | CUTANEOUS | Status: DC
  Administered 2018-08-03 – 2018-08-10 (×18): via TOPICAL
  Filled 2018-08-03 (×2): qty 15

## 2018-08-03 MED ORDER — INSULIN ASPART 100 UNIT/ML ~~LOC~~ SOLN
0.0000 [IU] | Freq: Three times a day (TID) | SUBCUTANEOUS | Status: DC
  Administered 2018-08-03: 5 [IU] via SUBCUTANEOUS
  Administered 2018-08-03 – 2018-08-04 (×4): 3 [IU] via SUBCUTANEOUS
  Administered 2018-08-05: 5 [IU] via SUBCUTANEOUS
  Administered 2018-08-05 (×2): 3 [IU] via SUBCUTANEOUS
  Administered 2018-08-06: 2 [IU] via SUBCUTANEOUS
  Administered 2018-08-06: 3 [IU] via SUBCUTANEOUS

## 2018-08-03 NOTE — Progress Notes (Addendum)
Patient is 83 year old female with past medical history of  hypertension, hyperlipidemia, coronary artery disease, peripheral vascular disease,CKD stage 3  who presented to the emergency department here with complaints of generalized weakness.Also found to be hypoxic on presentation.  Chest x-ray on presentation did not show any acute pulmonary disease. Found to have bilateral wheezes on auscultation.  Also found to be tachycardic, tachypneic with temperature of 100.64.  Found to have leukocytosis. Patient met SIRS criteria.  Therefore started on broad start antibiotics.  Culture sent.  Started on supplemental oxygen.  Also found to have mild elevation of lactic acid on presentation.  Started on IV fluids.  Influenza panel came to be negative. Her kidney function is on baseline.  She has history of stage III CKD. Patient seen and examined the bedside.  Currently she is hemodynamically stable.  Still has mild bilateral scattered wheezes.S he does not have any documented history of COPD.  Currently she smokes 7 or 8 cigarettes a day.  Counseled for smoking cessation. Overall she looks improved.  We will try to wean her off oxygen.  We will do physical therapy for her.  Please consider discontinuing  the IV antibiotics by tomorrow. Extensive discussion held at the bedside with the daughter about the current situation and make further management plan. Patient seen by Dr. Darrick Meigs this morning.  Addendum: Blood culture preliminary showing gram-positive cocci in both sample.  Continue vancomycin.  Will consider ID consultation if needed after further advancement  on blood culture report.

## 2018-08-03 NOTE — Progress Notes (Signed)
PHARMACY - PHYSICIAN COMMUNICATION CRITICAL VALUE ALERT - BLOOD CULTURE IDENTIFICATION (BCID)  [name] [name] is an 83 y.o. female who presented to [facility] on 08/02/2018 with a chief complaint of fall  Assessment:  BCID staph aureus- methicillin resistance NOT detected   Current antibiotics: Vancomycin   Changes to prescribed antibiotics recommended:  Recommend changing to cefazolin 2000 mg IV every 12 hours for current renal function.  Results for orders placed or performed during the hospital encounter of 08/02/18  Blood Culture ID Panel (Reflexed) (Collected: 08/02/2018  7:18 PM)  Result Value Ref Range   Enterococcus species NOT DETECTED NOT DETECTED   Listeria monocytogenes NOT DETECTED NOT DETECTED   Staphylococcus species DETECTED (A) NOT DETECTED   Staphylococcus aureus (BCID) DETECTED (A) NOT DETECTED   Methicillin resistance NOT DETECTED NOT DETECTED   Streptococcus species NOT DETECTED NOT DETECTED   Streptococcus agalactiae NOT DETECTED NOT DETECTED   Streptococcus pneumoniae NOT DETECTED NOT DETECTED   Streptococcus pyogenes NOT DETECTED NOT DETECTED   Acinetobacter baumannii NOT DETECTED NOT DETECTED   Enterobacteriaceae species NOT DETECTED NOT DETECTED   Enterobacter cloacae complex NOT DETECTED NOT DETECTED   Escherichia coli NOT DETECTED NOT DETECTED   Klebsiella oxytoca NOT DETECTED NOT DETECTED   Klebsiella pneumoniae NOT DETECTED NOT DETECTED   Proteus species NOT DETECTED NOT DETECTED   Serratia marcescens NOT DETECTED NOT DETECTED   Haemophilus influenzae NOT DETECTED NOT DETECTED   Neisseria meningitidis NOT DETECTED NOT DETECTED   Pseudomonas aeruginosa NOT DETECTED NOT DETECTED   Candida albicans NOT DETECTED NOT DETECTED   Candida glabrata NOT DETECTED NOT DETECTED   Candida krusei NOT DETECTED NOT DETECTED   Candida parapsilosis NOT DETECTED NOT DETECTED   Candida tropicalis NOT DETECTED NOT DETECTED    Ramond Craver 08/03/2018  2:45 PM

## 2018-08-03 NOTE — H&P (Addendum)
TRH H&P    Patient Demographics:    [name] [name], is a 83 y.o. female  MRN: [id number]  DOB - 1936/02/27  Admit Date - 08/02/2018  Referring MD/NP/PA: Nanda Quinton  Outpatient Primary MD for the patient is Dettinger, Fransisca Kaufmann, MD  Patient coming from: Home  Chief complaint-fall   HPI:    [name] [name]  is a 83 y.o. female, with history of hypertension, hyperlipidemia, CAD, peripheral vascular disease came to hospital with generalized weakness.  Patient says that earlier she fell at home.  She denies passing out but says that she was so weak that she just went down to the floor.  Denies any head injury.  EMS was called and patient was noted to have hypoxemia. She denies chest pain but complains of dyspnea on exertion. Denies nausea vomiting or diarrhea. She does smoke cigarettes. In the ED chest x-ray showed no acute disease. No previous history of stroke or seizures.   Review of systems:    In addition to the HPI above,    All other systems reviewed and are negative.    Past History of the following :    Past Medical History:  Diagnosis Date  . Angina 1995  . Anxiety   . Bruit   . CAD   . Carotid artery occlusion   . Cough   . Depression   . DM   . Edema   . HTN (hypertension)   . Hyperlipidemia   . Myocardial infarction ([place]) 1995  . OBESITY   . PONV (postoperative nausea and vomiting)   . PVD   . Shortness of breath dyspnea   . VENOUS INSUFFICIENCY       Past Surgical History:  Procedure Laterality Date  . CATARACT EXTRACTION W/PHACO  11/01/2011   Procedure: CATARACT EXTRACTION PHACO AND INTRAOCULAR LENS PLACEMENT (IOC);  Surgeon: Tonny Branch, MD;  Location: [facility];  Service: Ophthalmology;  Laterality: Right;  CDE:12.21  . CATARACT EXTRACTION W/PHACO  01/31/2012   Procedure: CATARACT EXTRACTION PHACO AND INTRAOCULAR LENS PLACEMENT (IOC);  Surgeon: Tonny Branch, MD;  Location: [facility];   Service: Ophthalmology;  Laterality: Left;  CDE:11.77  . CORONARY ANGIOPLASTY WITH STENT PLACEMENT  2006  . ENDARTERECTOMY Left 09/12/2014   Procedure: LEFT CAROTID ARTERY ENDARTERECTOMY;  Surgeon: Angelia Mould, MD;  Location: [facility];  Service: Vascular;  Laterality: Left;  . EYE SURGERY    . EYE SURGERY Bilateral   . PATCH ANGIOPLASTY Left 09/12/2014   Procedure: WITH DACRON PATCH ANGIOPLASTY;  Surgeon: Angelia Mould, MD;  Location: [facility];  Service: Vascular;  Laterality: Left;      Social History:      Social History   Tobacco Use  . Smoking status: Current Every Day Smoker    Packs/day: 0.50    Years: 53.00    Pack years: 26.50    Types: Cigarettes  . Smokeless tobacco: Never Used  . Tobacco comment: smoker since age 14 pt has not had cigarette since 09/11/14.  Substance Use Topics  . Alcohol use: No  Alcohol/week: 0.0 standard drinks       Family History :     Family History  Problem Relation Age of Onset  . Anuerysm Mother   . Stroke Father   . Anesthesia problems Neg Hx   . Hypotension Neg Hx   . Malignant hyperthermia Neg Hx   . Pseudochol deficiency Neg Hx       Home Medications:   Prior to Admission medications   Medication Sig Start Date End Date Taking? Authorizing Provider  atorvastatin (LIPITOR) 80 MG tablet Take 1 tablet (80 mg total) by mouth daily at 6 PM. 04/05/18  Yes Dettinger, Fransisca Kaufmann, MD  cilostazol (PLETAL) 100 MG tablet TAKE 1 TABLET (100 MG TOTAL) BY MOUTH 2 (TWO) TIMES DAILY. Patient taking differently: Take 100 mg by mouth 2 (two) times daily.  11/29/17  Yes Josue Hector, MD  clopidogrel (PLAVIX) 75 MG tablet TAKE 1 TABLET (75 MG TOTAL) BY MOUTH DAILY. Patient taking differently: Take 75 mg by mouth daily.  07/17/18  Yes Josue Hector, MD  diclofenac sodium (VOLTAREN) 1 % GEL Apply 2 g topically daily as needed. Reported on 12/23/2015 Patient taking differently: Apply 2 g topically daily as needed (for pain).  05/09/17   Yes Dettinger, Fransisca Kaufmann, MD  furosemide (LASIX) 20 MG tablet Take 1 tablet (20 mg total) by mouth daily. 04/05/18  Yes Dettinger, Fransisca Kaufmann, MD  glimepiride (AMARYL) 2 MG tablet Take 1 tablet (2 mg total) by mouth daily with breakfast. 10/24/17  Yes Dettinger, Fransisca Kaufmann, MD  LORazepam (ATIVAN) 0.5 MG tablet Take 1 tablet (0.5 mg total) by mouth at bedtime as needed for anxiety. 04/24/18  Yes Dettinger, Fransisca Kaufmann, MD  losartan (COZAAR) 100 MG tablet Take 1 tablet (100 mg total) by mouth daily. 04/05/18  Yes Dettinger, Fransisca Kaufmann, MD  metoprolol succinate (TOPROL-XL) 50 MG 24 hr tablet TAKE 1 AND 1/2 TABLETS BY MOUTH 2 (TWO) TIMES DAILY. Patient taking differently: Take 75 mg by mouth 2 (two) times daily.  11/25/17  Yes Josue Hector, MD  nitroGLYCERIN (NITROSTAT) 0.4 MG SL tablet Place 1 tablet (0.4 mg total) under the tongue every 5 (five) minutes as needed for chest pain (3 doses MAX). Reported on 12/23/2015 11/18/16  Yes Josue Hector, MD  sitaGLIPtin (JANUVIA) 50 MG tablet Take 1 tablet (50 mg total) by mouth daily. 04/05/18  Yes Dettinger, Fransisca Kaufmann, MD  blood glucose meter kit and supplies Dispense based on patient and insurance preference. Use up to four times daily as directed. (FOR ICD-10 E10.9, E11.9). 04/05/18   Dettinger, Fransisca Kaufmann, MD  Blood Glucose Monitoring Suppl (ONE TOUCH ULTRA 2) w/Device KIT Use to check blood sugars twice daily 04/11/18   Dettinger, Fransisca Kaufmann, MD  glucose blood (ONE TOUCH ULTRA TEST) test strip Use to check blood sugars daily 09/27/17   Dettinger, Fransisca Kaufmann, MD     Allergies:    No Known Allergies   Physical Exam:   Vitals  Blood pressure (!) 136/55, pulse (!) 113, temperature (!) 102.6 F (39.2 C), temperature source Oral, resp. rate (!) 24, height 5' (1.524 m), weight 108.9 kg, SpO2 99 %.  1.  General: Appears in no acute distress  2. Psychiatric:  Intact judgement and  insight, awake alert, oriented x 3.  3. Neurologic: No focal neurological deficits, all cranial  nerves intact.Strength 5/5 all 4 extremities, sensation intact all 4 extremities, plantars down going.  4. Eyes :  anicteric sclerae, moist conjunctivae with  no lid lag. PERRLA.  5. ENMT:  Oropharynx clear with moist mucous membranes and good dentition  6. Neck:  supple, no cervical lymphadenopathy appriciated, No thyromegaly  7. Respiratory : Normal respiratory effort, bilateral wheezing auscultated  8. Cardiovascular : RRR, no gallops, rubs or murmurs, no leg edema  9. Gastrointestinal:  Positive bowel sounds, abdomen soft, non-tender to palpation,no hepatosplenomegaly, no rigidity or guarding       10. Skin:  No cyanosis, normal texture and turgor, no rash, lesions or ulcers  11.Musculoskeletal:  Good muscle tone,  joints appear normal , no effusions,  normal range of motion    Data Review:    CBC Recent Labs  Lab 08/02/18 1918  WBC 13.5*  HGB 9.3*  HCT 31.6*  PLT 132*  MCV 101.3*  MCH 29.8  MCHC 29.4*  RDW 15.5  LYMPHSABS 0.5*  MONOABS 0.4  EOSABS 0.0  BASOSABS 0.0   ------------------------------------------------------------------------------------------------------------------  Results for orders placed or performed during the hospital encounter of 08/02/18 (from the past 48 hour(s))  Comprehensive metabolic panel     Status: Abnormal   Collection Time: 08/02/18  7:18 PM  Result Value Ref Range   Sodium 138 135 - 145 mmol/L   Potassium 3.2 (L) 3.5 - 5.1 mmol/L   Chloride 104 98 - 111 mmol/L   CO2 26 22 - 32 mmol/L   Glucose, Bld 104 (H) 70 - 99 mg/dL   BUN 19 8 - 23 mg/dL   Creatinine, Ser 1.65 (H) 0.44 - 1.00 mg/dL   Calcium 7.9 (L) 8.9 - 10.3 mg/dL   Total Protein 6.8 6.5 - 8.1 g/dL   Albumin 2.7 (L) 3.5 - 5.0 g/dL   AST 24 15 - 41 U/L   ALT 14 0 - 44 U/L   Alkaline Phosphatase 111 38 - 126 U/L   Total Bilirubin 0.8 0.3 - 1.2 mg/dL   GFR calc non Af Amer 29 (L) >60 mL/min   GFR calc Af Amer 34 (L) >60 mL/min   Anion gap 8 5 - 15     Comment: Performed at [facility], [address], [place], [place] [address]  CBC WITH DIFFERENTIAL     Status: Abnormal   Collection Time: 08/02/18  7:18 PM  Result Value Ref Range   WBC 13.5 (H) 4.0 - 10.5 K/uL   RBC 3.12 (L) 3.87 - 5.11 MIL/uL   Hemoglobin 9.3 (L) 12.0 - 15.0 g/dL   HCT 31.6 (L) 36.0 - 46.0 %   MCV 101.3 (H) 80.0 - 100.0 fL   MCH 29.8 26.0 - 34.0 pg   MCHC 29.4 (L) 30.0 - 36.0 g/dL   RDW 15.5 11.5 - 15.5 %   Platelets 132 (L) 150 - 400 K/uL   nRBC 0.0 0.0 - 0.2 %   Neutrophils Relative % 92 %   Neutro Abs 12.4 (H) 1.7 - 7.7 K/uL   Lymphocytes Relative 4 %   Lymphs Abs 0.5 (L) 0.7 - 4.0 K/uL   Monocytes Relative 3 %   Monocytes Absolute 0.4 0.1 - 1.0 K/uL   Eosinophils Relative 0 %   Eosinophils Absolute 0.0 0.0 - 0.5 K/uL   Basophils Relative 0 %   Basophils Absolute 0.0 0.0 - 0.1 K/uL   Immature Granulocytes 1 %   Abs Immature Granulocytes 0.10 (H) 0.00 - 0.07 K/uL    Comment: Performed at [facility], [address], [place], [place] [address]  Brain natriuretic peptide     Status: Abnormal  Collection Time: 08/02/18  7:18 PM  Result Value Ref Range   B Natriuretic Peptide 275.0 (H) 0.0 - 100.0 pg/mL    Comment: Performed at [facility], [address] [place], [place] [address]  I-Stat CG4 Lactic Acid, ED     Status: Abnormal   Collection Time: 08/02/18  7:29 PM  Result Value Ref Range   Lactic Acid, Venous 2.12 (HH) 0.5 - 1.9 mmol/L   Comment NOTIFIED PHYSICIAN     Chemistries  Recent Labs  Lab 08/02/18 1918  NA 138  K 3.2*  CL 104  CO2 26  GLUCOSE 104*  BUN 19  CREATININE 1.65*  CALCIUM 7.9*  AST 24  ALT 14  ALKPHOS 111  BILITOT 0.8   ------------------------------------------------------------------------------------------------------------------  ------------------------------------------------------------------------------------------------------------------ GFR: Estimated Creatinine Clearance: 30.4 mL/min (A) (by C-G formula  based on SCr of 1.65 mg/dL (H)). Liver Function Tests: Recent Labs  Lab 08/02/18 1918  AST 24  ALT 14  ALKPHOS 111  BILITOT 0.8  PROT 6.8  ALBUMIN 2.7*   No results for input(s): LIPASE, AMYLASE in the last 168 hours. No results for input(s): AMMONIA in the last 168 hours. Coagulation Profile: No results for input(s): INR, PROTIME in the last 168 hours. Cardiac Enzymes: No results for input(s): CKTOTAL, CKMB, CKMBINDEX, TROPONINI in the last 168 hours. BNP (last 3 results) No results for input(s): PROBNP in the last 8760 hours. HbA1C: No results for input(s): HGBA1C in the last 72 hours. CBG: No results for input(s): GLUCAP in the last 168 hours. Lipid Profile: No results for input(s): CHOL, HDL, LDLCALC, TRIG, CHOLHDL, LDLDIRECT in the last 72 hours. Thyroid Function Tests: No results for input(s): TSH, T4TOTAL, FREET4, T3FREE, THYROIDAB in the last 72 hours. Anemia Panel: No results for input(s): VITAMINB12, FOLATE, FERRITIN, TIBC, IRON, RETICCTPCT in the last 72 hours.  --------------------------------------------------------------------------------------------------------------- Urine analysis:    Component Value Date/Time   COLORURINE YELLOW 09/11/2014 1332   APPEARANCEUR Clear 05/09/2017 1655   LABSPEC 1.010 09/11/2014 1332   PHURINE 6.0 09/11/2014 1332   GLUCOSEU Negative 05/09/2017 1655   HGBUR NEGATIVE 09/11/2014 1332   BILIRUBINUR Negative 05/09/2017 1655   KETONESUR NEGATIVE 09/11/2014 1332   PROTEINUR Trace (A) 05/09/2017 1655   PROTEINUR NEGATIVE 09/11/2014 1332   UROBILINOGEN 0.2 09/11/2014 1332   NITRITE Negative 05/09/2017 1655   NITRITE NEGATIVE 09/11/2014 1332   LEUKOCYTESUR 1+ (A) 05/09/2017 1655      Imaging Results:    Dg Chest Port 1 View  Result Date: 08/02/2018 CLINICAL DATA:  Fever EXAM: PORTABLE CHEST 1 VIEW COMPARISON:  09/13/2058 chest radiograph. FINDINGS: Stable cardiomediastinal silhouette with top-normal heart size. No  pneumothorax. No pleural effusion. Lungs appear clear, with no acute consolidative airspace disease and no pulmonary edema. IMPRESSION: No active disease. Electronically Signed   By: Ilona Sorrel M.D.   On: 08/02/2018 20:07    My personal review of EKG: Rhythm NSR   Assessment & Plan:    Active Problems:   COPD exacerbation ([place])   1. COPD exacerbation-patient presenting with bilateral wheezing, as ongoing tobacco abuse.  Will start Solu-Medrol 60 mg IV every 6 hours, DuoNeb every 6 hours.  2. SIRS-patient presenting with SIRS, tachycardia, tachypnea with temperature 100.6.  Started on vancomycin and cefepime for possible underlying pneumonia.  With new oxygen requirement.  Blood cultures x2 have been obtained.  Follow blood culture results.  Initial lactic acid 2.12.  Will recheck lactic acid level.  Will obtain influenza PCR.  3. Hypokalemia-potassium is 3.2, will  replace potassium and check BMP in a.m.  4. Diabetes mellitus type 2-we will hold Amaryl, start sliding scale insulin with NovoLog.  5. Peripheral arterial disease-continue Pletal  6. Coronary artery disease-stable, continue Plavix, Lipitor, Toprol-XL   DVT Prophylaxis-   Lovenox   AM Labs Ordered, also please review Full Orders  Family Communication: Admission, patients condition and plan of care including tests being ordered have been discussed with the patient and her daughter at bedside who indicate understanding and agree with the plan and Code Status.  Code Status: DNR  Admission status: Observation: Based on patients clinical presentation and evaluation of above clinical data, I have made determination that patient will need less than 2 midnight stay in the hospital  Time spent in minutes : 60 minutes   Oswald Hillock M.D on 08/03/2018 at 2:48 AM  Between 7am to 7pm - Pager - [phone]. After 7pm go to [email or web address] - password [facility]   [facility] - Office  [phone]

## 2018-08-03 NOTE — Consult Note (Addendum)
[facility]    Date of Admission:  08/02/2018           Day 1 vancomycin       Reason for Consult: Automatic consultation for MSSA bacteremia     Assessment: [name] [name] has MSSA bacteremia and the source is not evident at this time.  I agree with pharmacy's recommendation to switch to IV cefazolin as it has better activity against MSSA than vancomycin.  I will also order repeat blood cultures and a transthoracic echocardiogram to help [place] determine if she has endocarditis.  I will monitor remotely with you.  Plan: 1. Change vancomycin to IV cefazolin 2 g every 12 hours 2. Repeat blood cultures 3. Transthoracic echocardiogram  Principal Problem:   MRSA bacteremia Active Problems:   Type 2 diabetes mellitus with other specified complication (HCC)   Morbid obesity ([place])   SMOKER   Hypertension associated with diabetes ([place])   Coronary atherosclerosis   PVD   Venous (peripheral) insufficiency   CKD (chronic kidney disease), stage III (HCC)   COPD exacerbation (HCC)   Scheduled Meds: . atorvastatin  80 mg Oral q1800  . cilostazol  100 mg Oral BID  . clopidogrel  75 mg Oral Daily  . enoxaparin (LOVENOX) injection  40 mg Subcutaneous Q24H  . insulin aspart  0-9 Units Subcutaneous TID WC  . ipratropium-albuterol  3 mL Nebulization Q6H  . losartan  100 mg Oral Daily  . methylPREDNISolone sodium succinate  60 mg Intravenous Q6H  . metoprolol succinate  75 mg Oral BID  . sodium chloride flush  3 mL Intravenous Q12H   Continuous Infusions: . sodium chloride    . vancomycin     PRN Meds:.sodium chloride, LORazepam, sodium chloride flush  HPI: [name] [name] is a 83 y.o. female with multiple medical problems who was admitted early this morning with a temperature of 102.8, hypotension, hypoxia, and weakness after a fall at home.  She had no infiltrates or other acute abnormalities on chest x-ray.  She was started on broad empiric antibiotic therapy  for presumed COPD exacerbation both admission blood cultures have grown MSSA.   Review of Systems: Review of Systems  Unable to perform ROS: Other  Constitutional:       This is a remote consultation so no review of systems was obtained.    Past Medical History:  Diagnosis Date  . Angina 1995  . Anxiety   . Bruit   . CAD   . Carotid artery occlusion   . Cough   . Depression   . DM   . Edema   . HTN (hypertension)   . Hyperlipidemia   . Myocardial infarction ([place]) 1995  . OBESITY   . PONV (postoperative nausea and vomiting)   . PVD   . Shortness of breath dyspnea   . VENOUS INSUFFICIENCY     Social History   Tobacco Use  . Smoking status: Current Every Day Smoker    Packs/day: 0.50    Years: 53.00    Pack years: 26.50    Types: Cigarettes  . Smokeless tobacco: Never Used  . Tobacco comment: smoker since age 52 pt has not had cigarette since 09/11/14.  Substance Use Topics  . Alcohol use: No    Alcohol/week: 0.0 standard drinks  . Drug use: No    Family History  Problem Relation Age of Onset  . Anuerysm Mother   . Stroke Father   .  Anesthesia problems Neg Hx   . Hypotension Neg Hx   . Malignant hyperthermia Neg Hx   . Pseudochol deficiency Neg Hx    No Known Allergies  OBJECTIVE: Blood pressure (!) 142/62, pulse 78, temperature 98.4 F (36.9 C), temperature source Oral, resp. rate 20, height 5' (1.524 m), weight 108.9 kg, SpO2 96 %.  Physical Exam Constitutional:      Comments: This is a remote consultation so no physical exam was performed.     Lab Results Lab Results  Component Value Date   WBC 18.2 (H) 08/03/2018   HGB 8.5 (L) 08/03/2018   HCT 29.3 (L) 08/03/2018   MCV 101.7 (H) 08/03/2018   PLT 115 (L) 08/03/2018    Lab Results  Component Value Date   CREATININE 1.64 (H) 08/03/2018   BUN 20 08/03/2018   NA 136 08/03/2018   K 3.7 08/03/2018   CL 106 08/03/2018   CO2 23 08/03/2018    Lab Results  Component Value Date   ALT 18  08/03/2018   AST 66 (H) 08/03/2018   ALKPHOS 89 08/03/2018   BILITOT 1.0 08/03/2018     Microbiology: Recent Results (from the past 240 hour(s))  Blood Culture (routine x 2)     Status: None (Preliminary result)   Collection Time: 08/02/18  7:18 PM  Result Value Ref Range Status   Specimen Description   Final    BLOOD RIGHT ANTECUBITAL Performed at [facility], [address], [place], [place] [address]    Special Requests   Final    BOTTLES DRAWN AEROBIC AND ANAEROBIC Blood Culture adequate volume Performed at [facility], [address] [place], [place] [address]    Culture  Setup Time   Final    GRAM POSITIVE COCCI AEROBIC AND ANAEROBIC Gram Stain Report Called to,Read Back By and Verified With: COX D. AT 0910A ON [id number] BY THOMPSON S. Organism ID to follow CRITICAL RESULT CALLED TO, READ BACK BY AND VERIFIED WITH: HURTH PHARMD AT 1440 ON [id number] BY SJW Performed at [facility], [place] [address], [place], [place] [address]    Culture GRAM POSITIVE COCCI  Final   Report Status PENDING  Incomplete  Blood Culture ID Panel (Reflexed)     Status: Abnormal   Collection Time: 08/02/18  7:18 PM  Result Value Ref Range Status   Enterococcus species NOT DETECTED NOT DETECTED Final   Listeria monocytogenes NOT DETECTED NOT DETECTED Final   Staphylococcus species DETECTED (A) NOT DETECTED Final    Comment: CRITICAL RESULT CALLED TO, READ BACK BY AND VERIFIED WITH: HURTH PHARMD AT 1440 ON [id number] BY SJW    Staphylococcus aureus (BCID) DETECTED (A) NOT DETECTED Final    Comment: Methicillin (oxacillin) susceptible Staphylococcus aureus (MSSA). Preferred therapy is anti staphylococcal beta lactam antibiotic (Cefazolin or Nafcillin), unless clinically contraindicated. CRITICAL RESULT CALLED TO, READ BACK BY AND VERIFIED WITH: HURTH PHARMD AT 1440 ON [id number] BY SJW    Methicillin resistance NOT DETECTED NOT DETECTED Final   Streptococcus species NOT DETECTED NOT DETECTED Final    Streptococcus agalactiae NOT DETECTED NOT DETECTED Final   Streptococcus pneumoniae NOT DETECTED NOT DETECTED Final   Streptococcus pyogenes NOT DETECTED NOT DETECTED Final   Acinetobacter baumannii NOT DETECTED NOT DETECTED Final   Enterobacteriaceae species NOT DETECTED NOT DETECTED Final   Enterobacter cloacae complex NOT DETECTED NOT DETECTED Final   Escherichia coli NOT DETECTED NOT DETECTED Final   Klebsiella oxytoca NOT DETECTED NOT DETECTED Final  Klebsiella pneumoniae NOT DETECTED NOT DETECTED Final   Proteus species NOT DETECTED NOT DETECTED Final   Serratia marcescens NOT DETECTED NOT DETECTED Final   Haemophilus influenzae NOT DETECTED NOT DETECTED Final   Neisseria meningitidis NOT DETECTED NOT DETECTED Final   Pseudomonas aeruginosa NOT DETECTED NOT DETECTED Final   Candida albicans NOT DETECTED NOT DETECTED Final   Candida glabrata NOT DETECTED NOT DETECTED Final   Candida krusei NOT DETECTED NOT DETECTED Final   Candida parapsilosis NOT DETECTED NOT DETECTED Final   Candida tropicalis NOT DETECTED NOT DETECTED Final    Comment: Performed at [facility], [place] [address], [place], [place] [address]  Blood Culture (routine x 2)     Status: None (Preliminary result)   Collection Time: 08/02/18  8:13 PM  Result Value Ref Range Status   Specimen Description   Final    BLOOD LEFT HAND Performed at [facility], [address], [place], [place] [address]    Special Requests   Final    AEROBIC BOTTLE ONLY Blood Culture adequate volume Performed at [facility], [address], [place], [place] [address]    Culture  Setup Time   Final    GRAM POSITIVE COCCI AEROBIC BOTTLE ONLY Gram Stain Report Called to,Read Back By and Verified With: [facility] D. AT 0910A ON [id number] BY THOMPSON S. CRITICAL VALUE NOTED.  VALUE IS CONSISTENT WITH PREVIOUSLY REPORTED AND CALLED VALUE. Performed at [facility], [place] [address], [place],  [address]    Culture PENDING  Incomplete     Report Status PENDING  Incomplete    Michel Bickers, MD [facility] [place] [phone] pager   [phone] cell 08/03/2018, 3:00 PM

## 2018-08-03 NOTE — Plan of Care (Signed)

## 2018-08-03 NOTE — Progress Notes (Signed)
*  PRELIMINARY RESULTS* Echocardiogram 2D Echocardiogram has been performed.  Leavy Cella 08/03/2018, 4:18 PM

## 2018-08-04 DIAGNOSIS — W19XXXA Unspecified fall, initial encounter: Secondary | ICD-10-CM | POA: Diagnosis present

## 2018-08-04 DIAGNOSIS — J441 Chronic obstructive pulmonary disease with (acute) exacerbation: Secondary | ICD-10-CM | POA: Diagnosis present

## 2018-08-04 DIAGNOSIS — F419 Anxiety disorder, unspecified: Secondary | ICD-10-CM | POA: Diagnosis present

## 2018-08-04 DIAGNOSIS — E876 Hypokalemia: Secondary | ICD-10-CM | POA: Diagnosis present

## 2018-08-04 DIAGNOSIS — E1159 Type 2 diabetes mellitus with other circulatory complications: Secondary | ICD-10-CM | POA: Diagnosis not present

## 2018-08-04 DIAGNOSIS — B9561 Methicillin susceptible Staphylococcus aureus infection as the cause of diseases classified elsewhere: Secondary | ICD-10-CM | POA: Diagnosis present

## 2018-08-04 DIAGNOSIS — I2510 Atherosclerotic heart disease of native coronary artery without angina pectoris: Secondary | ICD-10-CM | POA: Diagnosis present

## 2018-08-04 DIAGNOSIS — D631 Anemia in chronic kidney disease: Secondary | ICD-10-CM | POA: Diagnosis present

## 2018-08-04 DIAGNOSIS — E1165 Type 2 diabetes mellitus with hyperglycemia: Secondary | ICD-10-CM | POA: Diagnosis present

## 2018-08-04 DIAGNOSIS — R7881 Bacteremia: Secondary | ICD-10-CM

## 2018-08-04 DIAGNOSIS — F329 Major depressive disorder, single episode, unspecified: Secondary | ICD-10-CM | POA: Diagnosis present

## 2018-08-04 DIAGNOSIS — I252 Old myocardial infarction: Secondary | ICD-10-CM | POA: Diagnosis not present

## 2018-08-04 DIAGNOSIS — E1122 Type 2 diabetes mellitus with diabetic chronic kidney disease: Secondary | ICD-10-CM | POA: Diagnosis present

## 2018-08-04 DIAGNOSIS — Z9841 Cataract extraction status, right eye: Secondary | ICD-10-CM | POA: Diagnosis not present

## 2018-08-04 DIAGNOSIS — E785 Hyperlipidemia, unspecified: Secondary | ICD-10-CM | POA: Diagnosis present

## 2018-08-04 DIAGNOSIS — Z6841 Body Mass Index (BMI) 40.0 and over, adult: Secondary | ICD-10-CM | POA: Diagnosis not present

## 2018-08-04 DIAGNOSIS — I872 Venous insufficiency (chronic) (peripheral): Secondary | ICD-10-CM | POA: Diagnosis present

## 2018-08-04 DIAGNOSIS — I152 Hypertension secondary to endocrine disorders: Secondary | ICD-10-CM | POA: Diagnosis present

## 2018-08-04 DIAGNOSIS — L89326 Pressure-induced deep tissue damage of left buttock: Secondary | ICD-10-CM | POA: Diagnosis present

## 2018-08-04 DIAGNOSIS — E1151 Type 2 diabetes mellitus with diabetic peripheral angiopathy without gangrene: Secondary | ICD-10-CM | POA: Diagnosis present

## 2018-08-04 DIAGNOSIS — R531 Weakness: Secondary | ICD-10-CM | POA: Diagnosis present

## 2018-08-04 DIAGNOSIS — Z9842 Cataract extraction status, left eye: Secondary | ICD-10-CM | POA: Diagnosis not present

## 2018-08-04 DIAGNOSIS — N183 Chronic kidney disease, stage 3 (moderate): Secondary | ICD-10-CM | POA: Diagnosis present

## 2018-08-04 DIAGNOSIS — F17210 Nicotine dependence, cigarettes, uncomplicated: Secondary | ICD-10-CM | POA: Diagnosis present

## 2018-08-04 DIAGNOSIS — E1169 Type 2 diabetes mellitus with other specified complication: Secondary | ICD-10-CM | POA: Diagnosis present

## 2018-08-04 LAB — BASIC METABOLIC PANEL
Anion gap: 7 (ref 5–15)
BUN: 27 mg/dL — ABNORMAL HIGH (ref 8–23)
CO2: 24 mmol/L (ref 22–32)
Calcium: 8 mg/dL — ABNORMAL LOW (ref 8.9–10.3)
Chloride: 104 mmol/L (ref 98–111)
Creatinine, Ser: 1.37 mg/dL — ABNORMAL HIGH (ref 0.44–1.00)
GFR calc Af Amer: 42 mL/min — ABNORMAL LOW (ref >60)
GFR calc non Af Amer: 36 mL/min — ABNORMAL LOW (ref >60)
Glucose, Bld: 232 mg/dL — ABNORMAL HIGH (ref 70–99)
Potassium: 3.9 mmol/L (ref 3.5–5.1)
Sodium: 135 mmol/L (ref 135–145)

## 2018-08-04 LAB — GLUCOSE, CAPILLARY
Glucose-Capillary: 205 mg/dL — ABNORMAL HIGH (ref 70–99)
Glucose-Capillary: 247 mg/dL — ABNORMAL HIGH (ref 70–99)
Glucose-Capillary: 211 mg/dL — ABNORMAL HIGH (ref 70–99)
Glucose-Capillary: 230 mg/dL — ABNORMAL HIGH (ref 70–99)

## 2018-08-04 LAB — CBC WITH DIFFERENTIAL/PLATELET
Abs Immature Granulocytes: 0.11 10*3/uL — ABNORMAL HIGH (ref 0.00–0.07)
Basophils Absolute: 0 10*3/uL (ref 0.0–0.1)
Basophils Relative: 0 %
EOS PCT: 0 %
Eosinophils Absolute: 0 10*3/uL (ref 0.0–0.5)
HCT: 27.1 % — ABNORMAL LOW (ref 36.0–46.0)
Hemoglobin: 8 g/dL — ABNORMAL LOW (ref 12.0–15.0)
Immature Granulocytes: 1 %
Lymphocytes Relative: 4 %
Lymphs Abs: 0.6 10*3/uL — ABNORMAL LOW (ref 0.7–4.0)
MCH: 30 pg (ref 26.0–34.0)
MCHC: 29.5 g/dL — AB (ref 30.0–36.0)
MCV: 101.5 fL — AB (ref 80.0–100.0)
MONO ABS: 0.5 10*3/uL (ref 0.1–1.0)
Monocytes Relative: 3 %
Neutro Abs: 15.9 10*3/uL — ABNORMAL HIGH (ref 1.7–7.7)
Neutrophils Relative %: 92 %
Platelets: 88 10*3/uL — ABNORMAL LOW (ref 150–400)
RBC: 2.67 MIL/uL — ABNORMAL LOW (ref 3.87–5.11)
RDW: 15.6 % — ABNORMAL HIGH (ref 11.5–15.5)
WBC: 17.1 10*3/uL — ABNORMAL HIGH (ref 4.0–10.5)
nRBC: 0 % (ref 0.0–0.2)

## 2018-08-04 MED ORDER — BUDESONIDE 0.5 MG/2ML IN SUSP
0.5000 mg | Freq: Two times a day (BID) | RESPIRATORY_TRACT | Status: DC
  Administered 2018-08-04 – 2018-08-10 (×13): 0.5 mg via RESPIRATORY_TRACT
  Filled 2018-08-04 (×14): qty 2

## 2018-08-04 MED ORDER — IPRATROPIUM-ALBUTEROL 0.5-2.5 (3) MG/3ML IN SOLN
3.0000 mL | Freq: Four times a day (QID) | RESPIRATORY_TRACT | Status: DC
  Administered 2018-08-05 – 2018-08-09 (×14): 3 mL via RESPIRATORY_TRACT
  Filled 2018-08-04 (×16): qty 3

## 2018-08-04 MED ORDER — INSULIN GLARGINE 100 UNIT/ML ~~LOC~~ SOLN
7.0000 [IU] | Freq: Every day | SUBCUTANEOUS | Status: DC
  Administered 2018-08-04 – 2018-08-05 (×2): 7 [IU] via SUBCUTANEOUS
  Filled 2018-08-04 (×4): qty 0.07

## 2018-08-04 MED ORDER — METHYLPREDNISOLONE SODIUM SUCC 125 MG IJ SOLR
40.0000 mg | Freq: Two times a day (BID) | INTRAMUSCULAR | Status: DC
  Administered 2018-08-04 – 2018-08-06 (×4): 40 mg via INTRAVENOUS
  Filled 2018-08-04 (×4): qty 2

## 2018-08-04 NOTE — Clinical Social Work Note (Signed)
Patient will discharge home with HHPT.   LCSW signing off.    Tavie Haseman, Clydene Pugh, LCSW

## 2018-08-04 NOTE — Evaluation (Signed)
Physical Therapy Evaluation Patient Details Name: [name] [name] MRN: [id number] DOB: [birth year] Today's Date: 08/04/2018   History of Present Illness  [name] [name]  is a 83 y.o. female, with history of hypertension, hyperlipidemia, CAD, peripheral vascular disease came to hospital with generalized weakness.  Patient says that earlier she fell at home.  She denies passing out but says that she was so weak that she just went down to the floor.  Denies any head injury.  EMS was called and patient was noted to have hypoxemia.    Clinical Impression  Patient normally sleeps in a recliner and demonstrated fair/good return for sitting up at bedside with head of bed raised, able to transfer to chair and ambulate to bathroom without AD and no loss of balance, but after sitting up on commode became SOB with decreased balance requiring use of RW to make it back to room with Min assistance, on room air with O2 saturation dropping to 88-90% after walking back to chair, O2 saturation increased to 94-95% once resting - RN notified.  Patient tolerated staying up in chair with her daughter present in room after therapy.  Patient will benefit from continued physical therapy in hospital and recommended venue below to increase strength, balance, endurance for safe ADLs and gait.    Follow Up Recommendations SNF;Supervision for mobility/OOB;Supervision - Intermittent    Equipment Recommendations  Rolling walker with 5" wheels;3in1 (PT)(need large BSC)    Recommendations for Other Services       Precautions / Restrictions Precautions Precautions: Fall Restrictions Weight Bearing Restrictions: No      Mobility  Bed Mobility Overal bed mobility: Needs Assistance Bed Mobility: Supine to Sit     Supine to sit: Min assist     General bed mobility comments: head of bed raised, slow labored movement  Transfers Overall transfer level: Needs assistance Equipment used: Rolling walker (2  wheeled);None Transfers: Sit to/from [employer] to Stand: Min guard Stand pivot transfers: Min guard       General transfer comment: labored movement  Ambulation/Gait Ambulation/Gait assistance: Min assist Gait Distance (Feet): 12 Feet Assistive device: Rolling walker (2 wheeled);None Gait Pattern/deviations: Decreased step length - right;Decreased step length - left;Decreased stride length Gait velocity: decreased    General Gait Details: slightly labored slow cadence without AD, once fatigued required use of RW due to decreased balance/generalized weakness  Stairs            Wheelchair Mobility    Modified Rankin (Stroke Patients Only)       Balance Overall balance assessment: Needs assistance Sitting-balance support: Feet supported;No upper extremity supported Sitting balance-Leahy Scale: Good     Standing balance support: During functional activity;No upper extremity supported Standing balance-Leahy Scale: Fair Standing balance comment: fair/poor once fatigued without AD, fair using RW                             Pertinent Vitals/Pain Pain Assessment: Faces Faces Pain Scale: Hurts a little bit Pain Location: buttocks Pain Descriptors / Indicators: Discomfort Pain Intervention(s): Limited activity within patient's tolerance;Monitored during session    Home Living Family/patient expects to be discharged to:: Private residence Living Arrangements: Children Available Help at Discharge: Family Type of Home: Mobile home Home Access: Stairs to enter Entrance Stairs-Rails: Psychiatric nurse of Steps: 2 Home Layout: One level Home Equipment: Shower seat      Prior Function Level of Independence: Independent with assistive  device(s)         Comments: household ambulator, leans on walls and furniture     Hand Dominance        Extremity/Trunk Assessment   Upper Extremity Assessment Upper Extremity  Assessment: Generalized weakness    Lower Extremity Assessment Lower Extremity Assessment: Generalized weakness    Cervical / Trunk Assessment Cervical / Trunk Assessment: Normal  Communication   Communication: No difficulties  Cognition Arousal/Alertness: Awake/alert Behavior During Therapy: WFL for tasks assessed/performed Overall Cognitive Status: Within Functional Limits for tasks assessed                                        General Comments      Exercises     Assessment/Plan    PT Assessment Patient needs continued PT services  PT Problem List Decreased strength;Decreased activity tolerance;Decreased balance;Decreased mobility       PT Treatment Interventions Gait training;Stair training;Functional mobility training;Patient/family education;Therapeutic exercise    PT Goals (Current goals can be found in the Care Plan section)  Acute Rehab PT Goals Patient Stated Goal: return home with family to assist PT Goal Formulation: With patient/family Time For Goal Achievement: 08/18/18 Potential to Achieve Goals: Good    Frequency Min 3X/week   Barriers to discharge        Co-evaluation               AM-PAC PT "6 Clicks" Mobility  Outcome Measure Help needed turning from your back to your side while in a flat bed without using bedrails?: A Little(head of bed raised, labored movement) Help needed moving from lying on your back to sitting on the side of a flat bed without using bedrails?: Total Help needed moving to and from a bed to a chair (including a wheelchair)?: Total Help needed standing up from a chair using your arms (e.g., wheelchair or bedside chair)?: A Little Help needed to walk in hospital room?: A Little Help needed climbing 3-5 steps with a railing? : A Little 6 Click Score: 14    End of Session Equipment Utilized During Treatment: Gait belt Activity Tolerance: Patient tolerated treatment well;Patient limited by  fatigue Patient left: in chair;with call bell/phone within reach;with family/visitor present Nurse Communication: Mobility status PT Visit Diagnosis: Unsteadiness on feet (R26.81);Other abnormalities of gait and mobility (R26.89);Muscle weakness (generalized) (M62.81)    Time: 0935-1010 PT Time Calculation (min) (ACUTE ONLY): 35 min   Charges:   PT Evaluation $PT Eval Moderate Complexity: 1 Mod PT Treatments $Therapeutic Activity: 23-37 mins        2:05 PM, 08/04/18 Lonell Grandchild, MPT Physical Therapist with [facility] [phone] office [phone] mobile phone

## 2018-08-04 NOTE — Care Management Note (Signed)
Case Management Note  Patient Details  Name: [name] [name] MRN: [id number] Date of Birth: 05/24/1936  Subjective/Objective:      MRSA bacteremia. From home with daughter. Declines SNF. Discussed HH PT (elected [facility] with previous discussion with CSW) and potential need of IV antibiotics. Daughter is agreeable to learning to administer.           Action/Plan: DC home with home health. Brad of [facility] notified and will follow for orders.   Expected Discharge Date:  08/05/18               Expected Discharge Plan:  [place]  In-House Referral:     Discharge planning Services  CM Consult  Post Acute Care Choice:  Home Health Choice offered to:  Patient, Adult Children  DME Arranged:    DME Agency:     HH Arranged:  RN, PT, IV Antibiotics HH Agency:  [place]  Status of Service:  In process, will continue to follow  If discussed at Long Length of Stay Meetings, dates discussed:    Additional Comments:  Chapman Matteucci, Chauncey Reading, RN 08/04/2018, 1:43 PM

## 2018-08-04 NOTE — Progress Notes (Addendum)
Patient ID: [name] [name], female   DOB: October 21, 1935, 83 y.o.   MRN: [id number]          [facility] for Infectious Disease    Date of Admission:  08/02/2018    Total days of antibiotics 3        Day 2 cefazolin  Ms. Cawley has community-acquired MSSA bacteremia.  So far it appears that the source is unclear.  She has defervesced rapidly on antibiotics.  Repeat blood cultures were drawn this morning.  TTE shows no evidence of endocarditis.  If she remains afebrile, repeat blood cultures are negative, and there is no concern about endocarditis by clinical exam it may be reasonable to forego TEE and treat with cefazolin for 3 to 4 weeks.  I will follow-up on Monday, 08/07/2018.         Michel Bickers, MD [facility] for Infectious [place] [facility] [phone] pager   [phone] cell 08/04/2018, 11:35 AM

## 2018-08-04 NOTE — Progress Notes (Signed)
PROGRESS NOTE    SHAQUANA BUEL  CBJ:[id number] DOB: [birth year] DOA: 08/02/2018 PCP: Dettinger, Fransisca Kaufmann, MD   Brief Narrative:  83 year old female with history of coronary artery disease, essential hypertension, hyperlipidemia, peripheral vascular disease came to the hospital with complaints of generalized weakness.  Patient reports she experienced fall at home and was unable to get help for some time.  When EMS arrived she was noted to be hypoxic. She was admitted to the hospital for COPD exacerbation and met Sirs criteria.  Her blood cultures ended up growing gram-positive cocci-MSSA.  Echocardiogram was negative.  Assessment & Plan:   Principal Problem:   MRSA bacteremia Active Problems:   Type 2 diabetes mellitus with other specified complication ([place])   Morbid obesity ([place])   SMOKER   Hypertension associated with diabetes ([place])   Coronary atherosclerosis   PVD   Venous (peripheral) insufficiency   CKD (chronic kidney disease), stage III (HCC)   COPD exacerbation ([place])   COPD with acute exacerbation (HCC)  Gram-positive cocci bacteremia, MSSA -We will repeat blood cultures today for surveillance -Echocardiogram-negative for any evidence of endocarditis - Infectious disease team is following. - Hold off on TEE, if repeat cultures are positive then will proceed with that otherwise empirically will treat it with 3-4 weeks of antibiotics  Abnormal breath sounds secondary to acute COPD exacerbation - Change Solu-Medrol to 40 mg every 12 hours.  Bronchodilators scheduled and as needed -Add Pulmicort twice daily - Ordered incentive spirometry and flutter valve -Supplemental oxygen as needed. -Flu swab is negative  Diabetes mellitus type 2 -Hemoglobin A1c 6.3.  Hold off on oral medication.  Continue insulin sliding scale and Accu-Chek, add Lantus 7 units daily  Peripheral chill disease -Continue cilostazol  Coronary artery disease -Currently patient is chest pain-free.   Continue home medication Plavix, statin, Toprol-XL.  Continue losartan 100 mg daily  Appears generally weak therefore PT/OT added.  Patient does not want to go to skilled nursing facility but is receptive to home health if necessary  DVT prophylaxis: Lovenox Code Status: DNR Family Communication: Daughter at bedside Disposition Plan: To be determined  Consultants:   Infectious disease  Procedures:   None  Antimicrobials:   Ancef   Subjective: Patient reports of exertional shortness of breath and generalized weakness.  Overall feels better than yesterday though.  Review of Systems Otherwise negative except as per HPI, including: General: Denies fever, chills, night sweats or unintended weight loss. Resp: Denies hemoptysis Cardiac: Denies chest pain, palpitations, orthopnea, paroxysmal nocturnal dyspnea. GI: Denies abdominal pain, nausea, vomiting, diarrhea or constipation GU: Denies dysuria, frequency, hesitancy or incontinence MS: Denies muscle aches, joint pain or swelling Neuro: Denies headache, neurologic deficits (focal weakness, numbness, tingling), abnormal gait Psych: Denies anxiety, depression, SI/HI/AVH Skin: Denies new rashes or lesions ID: Denies sick contacts, exotic exposures, travel  Objective: Vitals:   08/04/18 0736 08/04/18 0935 08/04/18 1300 08/04/18 1305  BP:  (!) 95/56    Pulse:  89    Resp:  18    Temp:      TempSrc:      SpO2: 96% 98% 92% 92%  Weight:      Height:        Intake/Output Summary (Last 24 hours) at 08/04/2018 1312 Last data filed at 08/04/2018 0930 Gross per 24 hour  Intake 435 ml  Output 300 ml  Net 135 ml   Filed Weights   08/02/18 1917  Weight: 108.9 kg    Examination:  General exam:  Appears calm and comfortable  Respiratory system: Diffuse coarse breath sounds Cardiovascular system: S1 & S2 heard, RRR. No JVD, murmurs, rubs, gallops or clicks. No pedal edema. Gastrointestinal system: Abdomen is nondistended, soft  and nontender. No organomegaly or masses felt. Normal bowel sounds heard. Central nervous system: Alert and oriented. No focal neurological deficits. Extremities: Symmetric 4 x 5 power. Skin: Multiple excoriation marks noted throughout her body. Psychiatry: Judgement and insight appear normal. Mood & affect appropriate.     Data Reviewed:   CBC: Recent Labs  Lab 08/02/18 1918 08/03/18 0256 08/04/18 0532  WBC 13.5* 18.2* 17.1*  NEUTROABS 12.4*  --  15.9*  HGB 9.3* 8.5* 8.0*  HCT 31.6* 29.3* 27.1*  MCV 101.3* 101.7* 101.5*  PLT 132* 115* 88*   Basic Metabolic Panel: Recent Labs  Lab 08/02/18 1918 08/03/18 0256 08/04/18 0532  NA 138 136 135  K 3.2* 3.7 3.9  CL 104 106 104  CO2 '26 23 24  '$ GLUCOSE 104* 116* 232*  BUN 19 20 27*  CREATININE 1.65* 1.64* 1.37*  CALCIUM 7.9* 7.6* 8.0*   GFR: Estimated Creatinine Clearance: 36.7 mL/min (A) (by C-G formula based on SCr of 1.37 mg/dL (H)). Liver Function Tests: Recent Labs  Lab 08/02/18 1918 08/03/18 0256  AST 24 66*  ALT 14 18  ALKPHOS 111 89  BILITOT 0.8 1.0  PROT 6.8 6.3*  ALBUMIN 2.7* 2.5*   No results for input(s): LIPASE, AMYLASE in the last 168 hours. No results for input(s): AMMONIA in the last 168 hours. Coagulation Profile: No results for input(s): INR, PROTIME in the last 168 hours. Cardiac Enzymes: No results for input(s): CKTOTAL, CKMB, CKMBINDEX, TROPONINI in the last 168 hours. BNP (last 3 results) No results for input(s): PROBNP in the last 8760 hours. HbA1C: Recent Labs    08/03/18 0256  HGBA1C 6.3*   CBG: Recent Labs  Lab 08/03/18 1108 08/03/18 1600 08/03/18 2117 08/04/18 0738 08/04/18 1110  GLUCAP 232* 251* 242* 205* 247*   Lipid Profile: No results for input(s): CHOL, HDL, LDLCALC, TRIG, CHOLHDL, LDLDIRECT in the last 72 hours. Thyroid Function Tests: No results for input(s): TSH, T4TOTAL, FREET4, T3FREE, THYROIDAB in the last 72 hours. Anemia Panel: No results for input(s):  VITAMINB12, FOLATE, FERRITIN, TIBC, IRON, RETICCTPCT in the last 72 hours. Sepsis Labs: Recent Labs  Lab 08/02/18 1929 08/03/18 0256  LATICACIDVEN 2.12* 0.9    Recent Results (from the past 240 hour(s))  Blood Culture (routine x 2)     Status: Abnormal (Preliminary result)   Collection Time: 08/02/18  7:18 PM  Result Value Ref Range Status   Specimen Description   Final    BLOOD RIGHT ANTECUBITAL Performed at [facility], [address] [place], [place] [address]    Special Requests   Final    BOTTLES DRAWN AEROBIC AND ANAEROBIC Blood Culture adequate volume Performed at [facility], [address] [place], [place] [address]    Culture  Setup Time   Final    GRAM POSITIVE COCCI AEROBIC AND ANAEROBIC Gram Stain Report Called to,Read Back By and Verified With: COX D. AT 0910A ON [id number] BY THOMPSON S. Organism ID to follow CRITICAL RESULT CALLED TO, READ BACK BY AND VERIFIED WITH: HURTH PHARMD AT 1440 ON [id number] BY SJW    Culture (A)  Final    STAPHYLOCOCCUS AUREUS SUSCEPTIBILITIES TO FOLLOW Performed at [facility], [place] [address] [place], [place] [address]    Report Status PENDING  Incomplete  Blood Culture  ID Panel (Reflexed)     Status: Abnormal   Collection Time: 08/02/18  7:18 PM  Result Value Ref Range Status   Enterococcus species NOT DETECTED NOT DETECTED Final   Listeria monocytogenes NOT DETECTED NOT DETECTED Final   Staphylococcus species DETECTED (A) NOT DETECTED Final    Comment: CRITICAL RESULT CALLED TO, READ BACK BY AND VERIFIED WITH: HURTH PHARMD AT 1440 ON [id number] BY SJW    Staphylococcus aureus (BCID) DETECTED (A) NOT DETECTED Final    Comment: Methicillin (oxacillin) susceptible Staphylococcus aureus (MSSA). Preferred therapy is anti staphylococcal beta lactam antibiotic (Cefazolin or Nafcillin), unless clinically contraindicated. CRITICAL RESULT CALLED TO, READ BACK BY AND VERIFIED WITH: HURTH PHARMD AT 1440 ON [id number] BY SJW    Methicillin  resistance NOT DETECTED NOT DETECTED Final   Streptococcus species NOT DETECTED NOT DETECTED Final   Streptococcus agalactiae NOT DETECTED NOT DETECTED Final   Streptococcus pneumoniae NOT DETECTED NOT DETECTED Final   Streptococcus pyogenes NOT DETECTED NOT DETECTED Final   Acinetobacter baumannii NOT DETECTED NOT DETECTED Final   Enterobacteriaceae species NOT DETECTED NOT DETECTED Final   Enterobacter cloacae complex NOT DETECTED NOT DETECTED Final   Escherichia coli NOT DETECTED NOT DETECTED Final   Klebsiella oxytoca NOT DETECTED NOT DETECTED Final   Klebsiella pneumoniae NOT DETECTED NOT DETECTED Final   Proteus species NOT DETECTED NOT DETECTED Final   Serratia marcescens NOT DETECTED NOT DETECTED Final   Haemophilus influenzae NOT DETECTED NOT DETECTED Final   Neisseria meningitidis NOT DETECTED NOT DETECTED Final   Pseudomonas aeruginosa NOT DETECTED NOT DETECTED Final   Candida albicans NOT DETECTED NOT DETECTED Final   Candida glabrata NOT DETECTED NOT DETECTED Final   Candida krusei NOT DETECTED NOT DETECTED Final   Candida parapsilosis NOT DETECTED NOT DETECTED Final   Candida tropicalis NOT DETECTED NOT DETECTED Final    Comment: Performed at [facility], [address], [place], [place] [address]  Blood Culture (routine x 2)     Status: Abnormal (Preliminary result)   Collection Time: 08/02/18  8:13 PM  Result Value Ref Range Status   Specimen Description   Final    BLOOD LEFT HAND Performed at [facility], [address] [place], [place] [address]    Special Requests   Final    AEROBIC BOTTLE ONLY Blood Culture adequate volume Performed at [facility], [address], [place], [place] [address]    Culture  Setup Time   Final    GRAM POSITIVE COCCI AEROBIC BOTTLE ONLY Gram Stain Report Called to,Read Back By and Verified With: COX D. AT 0910A ON [id number] BY THOMPSON S. CRITICAL VALUE NOTED.  VALUE IS CONSISTENT WITH PREVIOUSLY REPORTED AND CALLED  VALUE. Performed at [facility], [place] [address] [place], [place] [address]    Culture STAPHYLOCOCCUS AUREUS (A)  Final   Report Status PENDING  Incomplete  Culture, blood (routine x 2)     Status: None (Preliminary result)   Collection Time: 08/04/18  5:32 AM  Result Value Ref Range Status   Specimen Description BLOOD RIGHT ARM  Final   Special Requests   Final    BOTTLES DRAWN AEROBIC ONLY Blood Culture results may not be optimal due to an excessive volume of blood received in culture bottles Performed at [facility], [address], [place], [place] [address]    Culture PENDING  Incomplete   Report Status PENDING  Incomplete  Culture, blood (routine x 2)  Status: None (Preliminary result)   Collection Time: 08/04/18  6:43 AM  Result Value Ref Range Status   Specimen Description BLOOD LEFT ARM  Final   Special Requests   Final    BOTTLES DRAWN AEROBIC ONLY Blood Culture results may not be optimal due to an inadequate volume of blood received in culture bottles Performed at [facility], [address] [place], [place] [address]    Culture PENDING  Incomplete   Report Status PENDING  Incomplete         Radiology Studies: Dg Chest Port 1 View  Result Date: 08/02/2018 CLINICAL DATA:  Fever EXAM: PORTABLE CHEST 1 VIEW COMPARISON:  09/13/2058 chest radiograph. FINDINGS: Stable cardiomediastinal silhouette with top-normal heart size. No pneumothorax. No pleural effusion. Lungs appear clear, with no acute consolidative airspace disease and no pulmonary edema. IMPRESSION: No active disease. Electronically Signed   By: Ilona Sorrel M.D.   On: 08/02/2018 20:07        Scheduled Meds: . atorvastatin  80 mg Oral q1800  . budesonide (PULMICORT) nebulizer solution  0.5 mg Nebulization BID  . cilostazol  100 mg Oral BID  . clopidogrel  75 mg Oral Daily  . enoxaparin (LOVENOX) injection  40 mg Subcutaneous Q24H  . insulin aspart  0-9 Units Subcutaneous TID WC  .  ipratropium-albuterol  3 mL Nebulization Q6H  . losartan  100 mg Oral Daily  . methylPREDNISolone sodium succinate  60 mg Intravenous Q6H  . metoprolol succinate  75 mg Oral BID  . nystatin   Topical TID  . sodium chloride flush  3 mL Intravenous Q12H   Continuous Infusions: . sodium chloride    .  ceFAZolin (ANCEF) IV Stopped (08/04/18 0930)     LOS: 0 days   Time spent= 40 mins     Arsenio Loader, MD [facility]  If 7PM-7AM, please contact night-coverage [email or web address] 08/04/2018, 1:12 PM

## 2018-08-04 NOTE — Progress Notes (Signed)
Per MD am BP meds held.

## 2018-08-04 NOTE — Progress Notes (Signed)
Inpatient Diabetes Program Recommendations  AACE/ADA: New Consensus Statement on Inpatient Glycemic Control (2015)  Target Ranges:  Prepandial:   less than 140 mg/dL      Peak postprandial:   less than 180 mg/dL (1-2 hours)      Critically ill patients:  140 - 180 mg/dL   Results for CRISTEL, RAIL (MRN [id number]) as of 08/04/2018 09:15  Ref. Range 08/03/2018 11:08 08/03/2018 16:00 08/03/2018 21:17  Glucose-Capillary Latest Ref Range: 70 - 99 mg/dL 232 (H)  3 units NOVOLOG  251 (H)  5 units NOVOLOG  242 (H)   Results for TISHINA, LOWN (MRN [id number]) as of 08/04/2018 09:15  Ref. Range 08/04/2018 07:38  Glucose-Capillary Latest Ref Range: 70 - 99 mg/dL 205 (H)  3 units NOVOLOG    Results for DANAMARIE, MINAMI (MRN [id number]) as of 08/04/2018 09:15  Ref. Range 08/03/2018 02:56  Hemoglobin A1C Latest Ref Range: 4.8 - 5.6 % 6.3 (H)    Admit with: COPD/ SIRS  History: DM  Home DM Meds: Amaryl 2 mg Daily       Januvia 50 mg Daily  Current Orders: Novolog Sensitive Correction Scale/ SSI (0-9 units) TID AC     Getting Solumedrol 60 mg Q6 hours.  Novolog SSI started yesterday.     MD- Please consider the following in-hospital insulin adjustments while home DM meds are on hold and patient getting Steroids:  1. Start Lantus 10 units Daily (0.1 units/kg dosing based on weight of 108 kg)  2. Start Novolog Meal Coverage: Novolog 3 units TID with meals   (Please add the following Hold Parameters: Hold if pt eats <50% of meal, Hold if pt NPO)       --Will follow patient during hospitalization--  Wyn Quaker RN, MSN, CDE Diabetes Coordinator Inpatient Glycemic Control Team Team Pager: [phone] (8a-5p)

## 2018-08-04 NOTE — Plan of Care (Signed)
  Problem: Acute Rehab PT Goals(only PT should resolve) Goal: Pt Will Go Supine/Side To Sit Outcome: Progressing Flowsheets (Taken 08/04/2018 1414) Pt will go Supine/Side to Sit: with modified independence Goal: Patient Will Transfer Sit To/From Stand Outcome: Progressing Flowsheets (Taken 08/04/2018 1414) Patient will transfer sit to/from stand: with modified independence Goal: Pt Will Transfer Bed To Chair/Chair To Bed Outcome: Progressing Flowsheets (Taken 08/04/2018 1414) Pt will Transfer Bed to Chair/Chair to Bed: with modified independence Goal: Pt Will Ambulate Outcome: Progressing Flowsheets (Taken 08/04/2018 1414) Pt will Ambulate: 50 feet; with supervision; with rolling walker; with cane   2:16 PM, 08/04/18 Lonell Grandchild, MPT Physical Therapist with [facility] [phone] office [phone] mobile phone

## 2018-08-05 LAB — BASIC METABOLIC PANEL
ANION GAP: 8 (ref 5–15)
BUN: 32 mg/dL — ABNORMAL HIGH (ref 8–23)
CO2: 23 mmol/L (ref 22–32)
Calcium: 7.9 mg/dL — ABNORMAL LOW (ref 8.9–10.3)
Chloride: 105 mmol/L (ref 98–111)
Creatinine, Ser: 1.57 mg/dL — ABNORMAL HIGH (ref 0.44–1.00)
GFR calc Af Amer: 36 mL/min — ABNORMAL LOW (ref >60)
GFR calc non Af Amer: 31 mL/min — ABNORMAL LOW (ref >60)
Glucose, Bld: 239 mg/dL — ABNORMAL HIGH (ref 70–99)
Potassium: 4.2 mmol/L (ref 3.5–5.1)
Sodium: 136 mmol/L (ref 135–145)

## 2018-08-05 LAB — GLUCOSE, CAPILLARY
Glucose-Capillary: 220 mg/dL — ABNORMAL HIGH (ref 70–99)
Glucose-Capillary: 256 mg/dL — ABNORMAL HIGH (ref 70–99)
Glucose-Capillary: 244 mg/dL — ABNORMAL HIGH (ref 70–99)
Glucose-Capillary: 251 mg/dL — ABNORMAL HIGH (ref 70–99)

## 2018-08-05 LAB — CBC
HCT: 26.5 % — ABNORMAL LOW (ref 36.0–46.0)
Hemoglobin: 7.8 g/dL — ABNORMAL LOW (ref 12.0–15.0)
MCH: 29.5 pg (ref 26.0–34.0)
MCHC: 29.4 g/dL — ABNORMAL LOW (ref 30.0–36.0)
MCV: 100.4 fL — ABNORMAL HIGH (ref 80.0–100.0)
PLATELETS: 93 10*3/uL — AB (ref 150–400)
RBC: 2.64 MIL/uL — ABNORMAL LOW (ref 3.87–5.11)
RDW: 15.7 % — ABNORMAL HIGH (ref 11.5–15.5)
WBC: 15.9 10*3/uL — ABNORMAL HIGH (ref 4.0–10.5)
nRBC: 0.1 % (ref 0.0–0.2)

## 2018-08-05 LAB — CULTURE, BLOOD (ROUTINE X 2)
Special Requests: ADEQUATE
Special Requests: ADEQUATE

## 2018-08-05 LAB — MAGNESIUM: Magnesium: 2.3 mg/dL (ref 1.7–2.4)

## 2018-08-05 MED ORDER — CEFAZOLIN SODIUM-DEXTROSE 2-4 GM/100ML-% IV SOLN
2.0000 g | Freq: Two times a day (BID) | INTRAVENOUS | Status: DC
  Administered 2018-08-05 – 2018-08-07 (×4): 2 g via INTRAVENOUS
  Filled 2018-08-05 (×6): qty 100

## 2018-08-05 MED ORDER — CEFAZOLIN SODIUM-DEXTROSE 2-4 GM/100ML-% IV SOLN
2.0000 g | Freq: Three times a day (TID) | INTRAVENOUS | Status: DC
  Filled 2018-08-05 (×6): qty 100

## 2018-08-05 MED ORDER — INSULIN GLARGINE 100 UNIT/ML ~~LOC~~ SOLN
12.0000 [IU] | Freq: Every day | SUBCUTANEOUS | Status: DC
  Administered 2018-08-06 – 2018-08-07 (×2): 12 [IU] via SUBCUTANEOUS
  Filled 2018-08-05 (×3): qty 0.12

## 2018-08-05 NOTE — Progress Notes (Addendum)
Pharmacy Antibiotic Note  [name] [name] is a 83 y.o. female admitted on 08/02/2018 with MSSA bacteremia.  Pharmacy has been consulted for cefazolin dosing. Crcl improving, once crcl >67mls/min will increase cefazolin to Q8H. afebrile.  WBC 18.2>> 17.1>> 15.9.   TTE shows no evidence of endocarditis. Plan is for 3-4 weeks IV abx. May not require TEE  Plan: Continue cefazolin 2gm IV q12h,  Continue to monitor V/S,labs, and clinical progress F/U cxs Notes weekly  Height: 5' (152.4 cm) Weight: 240 lb (108.9 kg) IBW/kg (Calculated) : 45.5  Temp (24hrs), Avg:97.8 F (36.6 C), Min:97.7 F (36.5 C), Max:97.8 F (36.6 C)  Recent Labs  Lab 08/02/18 1918 08/02/18 1929 08/03/18 0256 08/04/18 0532 08/05/18 0821  WBC 13.5*  --  18.2* 17.1* 15.9*  CREATININE 1.65*  --  1.64* 1.37* 1.57*  LATICACIDVEN  --  2.12* 0.9  --   --     Estimated Creatinine Clearance: 32 mL/min (A) (by C-G formula based on SCr of 1.57 mg/dL (H)).    No Known Allergies  Antimicrobials this admission: Cefazolin 1/9 >> 1/8 cefepime >> 1/9 1/8 vancomycin >> 1/9 1/8 metronidazole >> 1/9  Microbiology results: 1/8 BCx: gram positive cocci - MSSA 1/10 BCX: ngtd  Thank you for allowing pharmacy to be a part of this patient's care.  Isac Sarna, BS [place], [place] Clinical Pharmacist Pager [phone] 08/05/2018 12:43 PM

## 2018-08-05 NOTE — Progress Notes (Signed)
PROGRESS NOTE    [name] [name]  EVO:[id number] DOB: 05-12-1936 DOA: 08/02/2018 PCP: Dettinger, Fransisca Kaufmann, MD   Brief Narrative:  83 year old female with history of coronary artery disease, essential hypertension, hyperlipidemia, peripheral vascular disease came to the hospital with complaints of generalized weakness.  Patient reports she experienced fall at home and was unable to get help for some time.  When EMS arrived she was noted to be hypoxic. She was admitted to the hospital for COPD exacerbation and met Sirs criteria.  Her blood cultures ended up growing gram-positive cocci-MSSA.  Echocardiogram was negative.  Assessment & Plan:   Principal Problem:   MRSA bacteremia Active Problems:   Type 2 diabetes mellitus with other specified complication ([place])   Morbid obesity ([place])   SMOKER   Hypertension associated with diabetes ([place])   Coronary atherosclerosis   PVD   Venous (peripheral) insufficiency   CKD (chronic kidney disease), stage III ([facility])   COPD exacerbation ([place])   COPD with acute exacerbation ([facility])  Gram-positive cocci bacteremia, MSSA -Repeat blood cultures 1/10-no growth until today. -Echocardiogram-negative for any evidence of endocarditis - Appreciate input from infectious disease team - For now hold off on TEE, if repeat cultures are positive then we may have to proceed with it otherwise empirically treat with antibiotics for 3-4 weeks. Discussed with Dr Megan Salon.   Abnormal breath sounds secondary to acute COPD exacerbation - Continue Solu-Medrol 40 mg every 12 hours.  Bronchodilators scheduled and as needed -Continue Pulmicort twice daily - Ordered incentive spirometry and flutter valve -Supplemental oxygen as needed. -Flu swab is negative  Diabetes mellitus type 2 with hyperglycemia -Hemoglobin A1c 6.3.  Hold off on oral medication.  Continue insulin sliding scale and Accu-Chek, increase Lantus to 12 units daily  Peripheral chill disease -Continue  cilostazol  Coronary artery disease -Currently patient is chest pain-free.  Continue home medication Plavix, statin, Toprol-XL.  Continue losartan 100 mg daily  Due to generalized weakness PT/OT-recommended skilled nursing facility but patient does not want to go to SNF.  Will eventually make arrangements for home health when medically improved.  DVT prophylaxis: Lovenox Code Status: DNR Family Communication: Daughter at bedside Disposition Plan: Maintain inpatient stay until more culture data is available.  In the meantime she requires IV antibiotics.  Consultants:   Infectious disease  Procedures:   None  Antimicrobials:   Ancef   Subjective: Patient admits of feeling slightly better in terms of breathing but still has exertional shortness of breath.  No fevers overnight.  Overall feels weak this morning. Again patient tells me she does not want to go to skilled nursing facility and would rather go to SNF. Review of Systems Otherwise negative except as per HPI, including: General = no fevers, chills, dizziness, malaise, fatigue HEENT/EYES = negative for pain, redness, loss of vision, double vision, blurred vision, loss of hearing, sore throat, hoarseness, dysphagia Cardiovascular= negative for chest pain, palpitation, murmurs, lower extremity swelling Respiratory/lungs= negative for shortness of breath, cough, hemoptysis, wheezing, mucus production Gastrointestinal= negative for nausea, vomiting,, abdominal pain, melena, hematemesis Genitourinary= negative for Dysuria, Hematuria, Change in Urinary Frequency MSK = Negative for arthralgia, myalgias, Back Pain, Joint swelling  Neurology= Negative for headache, seizures, numbness, tingling  Psychiatry= Negative for anxiety, depression, suicidal and homocidal ideation Allergy/Immunology= Medication/Food allergy as listed  Skin= Negative for Rash, lesions, ulcers, itching   Objective: Vitals:   08/04/18 2106 08/04/18 2109  08/05/18 0501 08/05/18 0803  BP: (!) 80/54 103/63 117/60   Pulse: Marland Kitchen)  103 94 74   Resp:  16 18   Temp:  97.8 F (36.6 C) 97.7 F (36.5 C)   TempSrc:  Oral Oral   SpO2: 97% 96% 98% 97%  Weight:      Height:        Intake/Output Summary (Last 24 hours) at 08/05/2018 1052 Last data filed at 08/05/2018 0817 Gross per 24 hour  Intake 100 ml  Output 1150 ml  Net -1050 ml   Filed Weights   08/02/18 1917  Weight: 108.9 kg    Examination:  Constitutional: NAD, calm, comfortable Eyes: PERRL, lids and conjunctivae normal ENMT: Mucous membranes are moist. Posterior pharynx clear of any exudate or lesions.Normal dentition.  Neck: normal, supple, no masses, no thyromegaly Respiratory: Diffuse coarse breath sounds Cardiovascular: Regular rate and rhythm, no murmurs / rubs / gallops. No extremity edema. 2+ pedal pulses. No carotid bruits.  Abdomen: no tenderness, no masses palpated. No hepatosplenomegaly. Bowel sounds positive.  Musculoskeletal: no clubbing / cyanosis. No joint deformity upper and lower extremities. Good ROM, no contractures. Normal muscle tone.  Skin: no rashes, lesions, ulcers. No induration Neurologic: CN 2-12 grossly intact. Sensation intact, DTR normal. Strength 4/5 in all 4.  Psychiatric: Normal judgment and insight. Alert and oriented x 3. Normal mood.   Data Reviewed:   CBC: Recent Labs  Lab 08/02/18 1918 08/03/18 0256 08/04/18 0532 08/05/18 0821  WBC 13.5* 18.2* 17.1* 15.9*  NEUTROABS 12.4*  --  15.9*  --   HGB 9.3* 8.5* 8.0* 7.8*  HCT 31.6* 29.3* 27.1* 26.5*  MCV 101.3* 101.7* 101.5* 100.4*  PLT 132* 115* 88* 93*   Basic Metabolic Panel: Recent Labs  Lab 08/02/18 1918 08/03/18 0256 08/04/18 0532 08/05/18 0821  NA 138 136 135 136  K 3.2* 3.7 3.9 4.2  CL 104 106 104 105  CO2 '26 23 24 23  '$ GLUCOSE 104* 116* 232* 239*  BUN 19 20 27* 32*  CREATININE 1.65* 1.64* 1.37* 1.57*  CALCIUM 7.9* 7.6* 8.0* 7.9*  MG  --   --   --  2.3   GFR: Estimated  Creatinine Clearance: 32 mL/min (A) (by C-G formula based on SCr of 1.57 mg/dL (H)). Liver Function Tests: Recent Labs  Lab 08/02/18 1918 08/03/18 0256  AST 24 66*  ALT 14 18  ALKPHOS 111 89  BILITOT 0.8 1.0  PROT 6.8 6.3*  ALBUMIN 2.7* 2.5*   No results for input(s): LIPASE, AMYLASE in the last 168 hours. No results for input(s): AMMONIA in the last 168 hours. Coagulation Profile: No results for input(s): INR, PROTIME in the last 168 hours. Cardiac Enzymes: No results for input(s): CKTOTAL, CKMB, CKMBINDEX, TROPONINI in the last 168 hours. BNP (last 3 results) No results for input(s): PROBNP in the last 8760 hours. HbA1C: Recent Labs    08/03/18 0256  HGBA1C 6.3*   CBG: Recent Labs  Lab 08/04/18 0738 08/04/18 1110 08/04/18 1607 08/04/18 2110 08/05/18 0737  GLUCAP 205* 247* 211* 230* 220*   Lipid Profile: No results for input(s): CHOL, HDL, LDLCALC, TRIG, CHOLHDL, LDLDIRECT in the last 72 hours. Thyroid Function Tests: No results for input(s): TSH, T4TOTAL, FREET4, T3FREE, THYROIDAB in the last 72 hours. Anemia Panel: No results for input(s): VITAMINB12, FOLATE, FERRITIN, TIBC, IRON, RETICCTPCT in the last 72 hours. Sepsis Labs: Recent Labs  Lab 08/02/18 1929 08/03/18 0256  LATICACIDVEN 2.12* 0.9    Recent Results (from the past 240 hour(s))  Blood Culture (routine x 2)     Status: Abnormal  Collection Time: 08/02/18  7:18 PM  Result Value Ref Range Status   Specimen Description   Final    BLOOD RIGHT ANTECUBITAL Performed at [facility], [address] [place], [place] [address]    Special Requests   Final    BOTTLES DRAWN AEROBIC AND ANAEROBIC Blood Culture adequate volume Performed at [facility], [address], [place], [place] [address]    Culture  Setup Time   Final    GRAM POSITIVE COCCI AEROBIC AND ANAEROBIC Gram Stain Report Called to,Read Back By and Verified With: COX D. AT 0910A ON [id number] BY THOMPSON S. CRITICAL RESULT CALLED TO,  READ BACK BY AND VERIFIED WITH: HURTH PHARMD AT 1440 ON [id number] BY SJW Performed at [facility], [place] [address], [place], [place] [address]    Culture STAPHYLOCOCCUS AUREUS (A)  Final   Report Status 08/05/2018 FINAL  Final   Organism ID, Bacteria STAPHYLOCOCCUS AUREUS  Final      Susceptibility   Staphylococcus aureus - MIC*    CIPROFLOXACIN <=0.5 SENSITIVE Sensitive     ERYTHROMYCIN <=0.25 SENSITIVE Sensitive     GENTAMICIN <=0.5 SENSITIVE Sensitive     OXACILLIN <=0.25 SENSITIVE Sensitive     TETRACYCLINE <=1 SENSITIVE Sensitive     VANCOMYCIN 1 SENSITIVE Sensitive     TRIMETH/SULFA <=10 SENSITIVE Sensitive     CLINDAMYCIN <=0.25 SENSITIVE Sensitive     RIFAMPIN <=0.5 SENSITIVE Sensitive     Inducible Clindamycin NEGATIVE Sensitive     * STAPHYLOCOCCUS AUREUS  Blood Culture ID Panel (Reflexed)     Status: Abnormal   Collection Time: 08/02/18  7:18 PM  Result Value Ref Range Status   Enterococcus species NOT DETECTED NOT DETECTED Final   Listeria monocytogenes NOT DETECTED NOT DETECTED Final   Staphylococcus species DETECTED (A) NOT DETECTED Final    Comment: CRITICAL RESULT CALLED TO, READ BACK BY AND VERIFIED WITH: HURTH PHARMD AT 1440 ON [id number] BY SJW    Staphylococcus aureus (BCID) DETECTED (A) NOT DETECTED Final    Comment: Methicillin (oxacillin) susceptible Staphylococcus aureus (MSSA). Preferred therapy is anti staphylococcal beta lactam antibiotic (Cefazolin or Nafcillin), unless clinically contraindicated. CRITICAL RESULT CALLED TO, READ BACK BY AND VERIFIED WITH: HURTH PHARMD AT 1440 ON [id number] BY SJW    Methicillin resistance NOT DETECTED NOT DETECTED Final   Streptococcus species NOT DETECTED NOT DETECTED Final   Streptococcus agalactiae NOT DETECTED NOT DETECTED Final   Streptococcus pneumoniae NOT DETECTED NOT DETECTED Final   Streptococcus pyogenes NOT DETECTED NOT DETECTED Final   Acinetobacter baumannii NOT DETECTED NOT DETECTED Final    Enterobacteriaceae species NOT DETECTED NOT DETECTED Final   Enterobacter cloacae complex NOT DETECTED NOT DETECTED Final   Escherichia coli NOT DETECTED NOT DETECTED Final   Klebsiella oxytoca NOT DETECTED NOT DETECTED Final   Klebsiella pneumoniae NOT DETECTED NOT DETECTED Final   Proteus species NOT DETECTED NOT DETECTED Final   Serratia marcescens NOT DETECTED NOT DETECTED Final   Haemophilus influenzae NOT DETECTED NOT DETECTED Final   Neisseria meningitidis NOT DETECTED NOT DETECTED Final   Pseudomonas aeruginosa NOT DETECTED NOT DETECTED Final   Candida albicans NOT DETECTED NOT DETECTED Final   Candida glabrata NOT DETECTED NOT DETECTED Final   Candida krusei NOT DETECTED NOT DETECTED Final   Candida parapsilosis NOT DETECTED NOT DETECTED Final   Candida tropicalis NOT DETECTED NOT DETECTED Final    Comment: Performed at [facility], [address], [place], [place] [address]  Blood  Culture (routine x 2)     Status: Abnormal   Collection Time: 08/02/18  8:13 PM  Result Value Ref Range Status   Specimen Description   Final    BLOOD LEFT HAND Performed at [facility], [address], [place], [place] [address]    Special Requests   Final    AEROBIC BOTTLE ONLY Blood Culture adequate volume Performed at [facility], [address], [place], [place] [address]    Culture  Setup Time   Final    GRAM POSITIVE COCCI AEROBIC BOTTLE ONLY Gram Stain Report Called to,Read Back By and Verified With: COX D. AT 0910A ON [id number] BY THOMPSON S. CRITICAL VALUE NOTED.  VALUE IS CONSISTENT WITH PREVIOUSLY REPORTED AND CALLED VALUE.    Culture (A)  Final    STAPHYLOCOCCUS AUREUS SUSCEPTIBILITIES PERFORMED ON PREVIOUS CULTURE WITHIN THE LAST 5 DAYS. Performed at [facility], [place] [address], [place], [place] [address]    Report Status 08/05/2018 FINAL  Final  Culture, blood (routine x 2)     Status: None (Preliminary result)   Collection Time: 08/04/18  5:32 AM  Result Value  Ref Range Status   Specimen Description BLOOD RIGHT ARM  Final   Special Requests   Final    BOTTLES DRAWN AEROBIC ONLY Blood Culture results may not be optimal due to an excessive volume of blood received in culture bottles   Culture   Final    NO GROWTH < 24 HOURS Performed at [facility], [address] [place], [place] [address]    Report Status PENDING  Incomplete  Culture, blood (routine x 2)     Status: None (Preliminary result)   Collection Time: 08/04/18  6:43 AM  Result Value Ref Range Status   Specimen Description BLOOD LEFT ARM  Final   Special Requests   Final    BOTTLES DRAWN AEROBIC ONLY Blood Culture results may not be optimal due to an inadequate volume of blood received in culture bottles   Culture   Final    NO GROWTH < 24 HOURS Performed at [facility], [address], [place],  [address]    Report Status PENDING  Incomplete         Radiology Studies: No results found.      Scheduled Meds: . atorvastatin  80 mg Oral q1800  . budesonide (PULMICORT) nebulizer solution  0.5 mg Nebulization BID  . cilostazol  100 mg Oral BID  . clopidogrel  75 mg Oral Daily  . enoxaparin (LOVENOX) injection  40 mg Subcutaneous Q24H  . insulin aspart  0-9 Units Subcutaneous TID WC  . insulin glargine  7 Units Subcutaneous Daily  . ipratropium-albuterol  3 mL Nebulization Q6H WA  . losartan  100 mg Oral Daily  . methylPREDNISolone sodium succinate  40 mg Intravenous Q12H  . metoprolol succinate  75 mg Oral BID  . nystatin   Topical TID  . sodium chloride flush  3 mL Intravenous Q12H   Continuous Infusions: . sodium chloride    .  ceFAZolin (ANCEF) IV 2 g (08/05/18 0817)     LOS: 1 day   Time spent= 40 mins    Shaundra Fullam Arsenio Loader, MD [facility]  If 7PM-7AM, please contact night-coverage [email or web address] 08/05/2018, 10:52 AM

## 2018-08-06 DIAGNOSIS — E1159 Type 2 diabetes mellitus with other circulatory complications: Secondary | ICD-10-CM

## 2018-08-06 DIAGNOSIS — I10 Essential (primary) hypertension: Secondary | ICD-10-CM

## 2018-08-06 DIAGNOSIS — I739 Peripheral vascular disease, unspecified: Secondary | ICD-10-CM

## 2018-08-06 DIAGNOSIS — E1169 Type 2 diabetes mellitus with other specified complication: Secondary | ICD-10-CM

## 2018-08-06 DIAGNOSIS — N183 Chronic kidney disease, stage 3 (moderate): Secondary | ICD-10-CM

## 2018-08-06 DIAGNOSIS — L8990 Pressure ulcer of unspecified site, unspecified stage: Secondary | ICD-10-CM

## 2018-08-06 DIAGNOSIS — F17200 Nicotine dependence, unspecified, uncomplicated: Secondary | ICD-10-CM

## 2018-08-06 LAB — CBC
HEMATOCRIT: 25.9 % — AB (ref 36.0–46.0)
Hemoglobin: 7.4 g/dL — ABNORMAL LOW (ref 12.0–15.0)
MCH: 29.2 pg (ref 26.0–34.0)
MCHC: 28.6 g/dL — ABNORMAL LOW (ref 30.0–36.0)
MCV: 102.4 fL — ABNORMAL HIGH (ref 80.0–100.0)
Platelets: 95 10*3/uL — ABNORMAL LOW (ref 150–400)
RBC: 2.53 MIL/uL — ABNORMAL LOW (ref 3.87–5.11)
RDW: 15.5 % (ref 11.5–15.5)
WBC: 11.5 10*3/uL — ABNORMAL HIGH (ref 4.0–10.5)
nRBC: 0 % (ref 0.0–0.2)

## 2018-08-06 LAB — BASIC METABOLIC PANEL
Anion gap: 6 (ref 5–15)
BUN: 40 mg/dL — ABNORMAL HIGH (ref 8–23)
CO2: 24 mmol/L (ref 22–32)
Calcium: 7.8 mg/dL — ABNORMAL LOW (ref 8.9–10.3)
Chloride: 109 mmol/L (ref 98–111)
Creatinine, Ser: 1.45 mg/dL — ABNORMAL HIGH (ref 0.44–1.00)
GFR calc Af Amer: 39 mL/min — ABNORMAL LOW (ref >60)
GFR calc non Af Amer: 34 mL/min — ABNORMAL LOW (ref >60)
GLUCOSE: 226 mg/dL — AB (ref 70–99)
Potassium: 4.2 mmol/L (ref 3.5–5.1)
Sodium: 139 mmol/L (ref 135–145)

## 2018-08-06 LAB — GLUCOSE, CAPILLARY
GLUCOSE-CAPILLARY: 248 mg/dL — AB (ref 70–99)
GLUCOSE-CAPILLARY: 265 mg/dL — AB (ref 70–99)
Glucose-Capillary: 190 mg/dL — ABNORMAL HIGH (ref 70–99)
Glucose-Capillary: 242 mg/dL — ABNORMAL HIGH (ref 70–99)
Glucose-Capillary: 291 mg/dL — ABNORMAL HIGH (ref 70–99)

## 2018-08-06 LAB — MAGNESIUM: Magnesium: 2.3 mg/dL (ref 1.7–2.4)

## 2018-08-06 MED ORDER — METHYLPREDNISOLONE SODIUM SUCC 125 MG IJ SOLR: 60.0000 mg | Freq: Two times a day (BID) | INTRAMUSCULAR | Status: DC

## 2018-08-06 MED ORDER — INSULIN ASPART 100 UNIT/ML ~~LOC~~ SOLN
0.0000 [IU] | Freq: Three times a day (TID) | SUBCUTANEOUS | Status: DC
  Administered 2018-08-06 – 2018-08-07 (×4): 7 [IU] via SUBCUTANEOUS
  Administered 2018-08-08: 11 [IU] via SUBCUTANEOUS
  Administered 2018-08-08 – 2018-08-09 (×2): 7 [IU] via SUBCUTANEOUS
  Administered 2018-08-09: 4 [IU] via SUBCUTANEOUS
  Administered 2018-08-09 – 2018-08-10 (×3): 7 [IU] via SUBCUTANEOUS

## 2018-08-06 MED ORDER — INSULIN ASPART 100 UNIT/ML ~~LOC~~ SOLN
0.0000 [IU] | Freq: Every day | SUBCUTANEOUS | Status: DC
  Administered 2018-08-06: 3 [IU] via SUBCUTANEOUS
  Administered 2018-08-07 – 2018-08-08 (×2): 2 [IU] via SUBCUTANEOUS

## 2018-08-06 MED ORDER — METHYLPREDNISOLONE SODIUM SUCC 125 MG IJ SOLR
60.0000 mg | Freq: Three times a day (TID) | INTRAMUSCULAR | Status: DC
  Administered 2018-08-06 – 2018-08-08 (×7): 60 mg via INTRAVENOUS
  Filled 2018-08-06 (×7): qty 2

## 2018-08-06 MED ORDER — INSULIN ASPART 100 UNIT/ML ~~LOC~~ SOLN
4.0000 [IU] | Freq: Three times a day (TID) | SUBCUTANEOUS | Status: DC
  Administered 2018-08-06: 4 [IU] via SUBCUTANEOUS

## 2018-08-06 NOTE — Progress Notes (Signed)
RN paged Dr. Darrick Meigs to make him aware that patient had 7 beat run of V tach, BP 100/60 manually, HR 77, patient asymptomatic.  RN awaiting response.  P.J. Linus Mako, RN

## 2018-08-06 NOTE — Progress Notes (Addendum)
PROGRESS NOTE    [name] [name]  QIH:[id number] DOB: [birth year] DOA: 08/02/2018 PCP: Dettinger, Fransisca Kaufmann, MD   Brief Narrative:  83 year old female with history of coronary artery disease, essential hypertension, hyperlipidemia, peripheral vascular disease came to the hospital with complaints of generalized weakness.  Patient reports she experienced fall at home and was unable to get help for some time.  When EMS arrived she was noted to be hypoxic. She was admitted to the hospital for COPD exacerbation and met Sirs criteria.  Her blood cultures ended up growing gram-positive cocci-MSSA.  Trans-esophageal Echocardiogram was negative.  Assessment & Plan:   Principal Problem:   MRSA bacteremia Active Problems:   Type 2 diabetes mellitus with other specified complication ([place])   Morbid obesity ([place])   SMOKER   Hypertension associated with diabetes ([place])   Coronary atherosclerosis   PVD   Venous (peripheral) insufficiency   CKD (chronic kidney disease), stage III (HCC)   COPD exacerbation ([place])   COPD with acute exacerbation (HCC)   Pressure injury of skin  Gram-positive cocci bacteremia, MSSA -Repeat blood cultures 1/10-no growth to date (48 hours). Will follow.  -Trans-esophageal Echocardiogram-negative for any evidence of endocarditis - Appreciate input from infectious disease team - For now hold off on TEE, if repeat cultures are positive then we may have to proceed with it otherwise empirically treat with antibiotics for 3-4 weeks. Per ID Dr Megan Salon.   Stage 3 CKD - following renal function panel.  acute COPD exacerbation - Increase Solu-Medrol 60 mg every 8 hours.  Bronchodilators scheduled and as needed - Continue Pulmicort twice daily - Ordered incentive spirometry and flutter valve - Supplemental oxygen as needed. - Flu swab is negative  Diabetes mellitus type 2 with hyperglycemia -Hemoglobin A1c 6.3.  Hold off on oral medication.  Continue insulin sliding scale and  Accu-Chek, increase Lantus to 12 units daily. Add prandial coverage.    CBG (last 3)  Recent Labs    08/05/18 2139 08/06/18 0727 08/06/18 1138  GLUCAP 251* 190* 242*   Peripheral chill disease -Continue cilostazol  Coronary artery disease -Currently patient is chest pain-free.  Continue home medication Plavix, statin, Toprol-XL.  Continue losartan 100 mg daily  Due to generalized weakness PT/OT-recommended skilled nursing facility but patient does not want to go to SNF.  Planning for home health when medically ready, hopefully in 1-2 days.  DVT prophylaxis: Lovenox Code Status: DNR Family Communication: Daughter at bedside Disposition Plan: Home in 1-2 days.   In the meantime she requires IV antibiotics.  Consultants:   Infectious disease  Procedures:   None  Antimicrobials:   Ancef   Subjective: Patient reports feeling weak and has continued to wheeze and cough.    Review of Systems Otherwise negative except as per HPI, including: General = no fevers, chills, dizziness, malaise, fatigue HEENT/EYES = negative for pain, redness, loss of vision, double vision, blurred vision, loss of hearing, sore throat, hoarseness, dysphagia Cardiovascular= negative for chest pain, palpitation, murmurs, lower extremity swelling Respiratory/lungs= negative for shortness of breath, cough, hemoptysis, wheezing, mucus production Gastrointestinal= negative for nausea, vomiting,, abdominal pain, melena, hematemesis Genitourinary= negative for Dysuria, Hematuria, Change in Urinary Frequency MSK = Negative for arthralgia, myalgias, Back Pain, Joint swelling  Neurology= Negative for headache, seizures, numbness, tingling  Psychiatry= Negative for anxiety, depression, suicidal and homocidal ideation Allergy/Immunology= Medication/Food allergy as listed  Skin= Negative for Rash, lesions, ulcers, itching   Objective: Vitals:   08/05/18 2143 08/06/18 0326 08/06/18 0521 08/06/18 0801  BP:  112/62 100/60 108/62   Pulse: 74 77 76   Resp: 18  18   Temp: 98.2 F (36.8 C)  97.9 F (36.6 C)   TempSrc: Oral  Oral   SpO2: 95%  97% 97%  Weight:      Height:        Intake/Output Summary (Last 24 hours) at 08/06/2018 1202 Last data filed at 08/06/2018 1045 Gross per 24 hour  Intake 940 ml  Output 400 ml  Net 540 ml   Filed Weights   08/02/18 1917  Weight: 108.9 kg    Examination:  Constitutional: NAD, calm, comfortable Eyes: PERRL, lids and conjunctivae normal ENMT: Mucous membranes are moist. Posterior pharynx clear of any exudate or lesions.Normal dentition.  Neck: normal, supple, no masses, no thyromegaly Respiratory: Diffuse expiratory wheezing.  Cardiovascular: Regular rate and rhythm, no murmurs / rubs / gallops. No extremity edema. 2+ pedal pulses. No carotid bruits.  Abdomen: no tenderness, no masses palpated. No hepatosplenomegaly. Bowel sounds positive.  Musculoskeletal: no clubbing / cyanosis. No joint deformity upper and lower extremities. Good ROM, no contractures. Normal muscle tone.  Skin: no rashes, lesions, ulcers. No induration Neurologic: CN 2-12 grossly intact. Sensation intact, DTR normal. Strength 4/5 in all 4.  Psychiatric: Normal judgment and insight. Alert and oriented x 3. Normal mood.   Data Reviewed:   CBC: Recent Labs  Lab 08/02/18 1918 08/03/18 0256 08/04/18 0532 08/05/18 0821 08/06/18 0708  WBC 13.5* 18.2* 17.1* 15.9* 11.5*  NEUTROABS 12.4*  --  15.9*  --   --   HGB 9.3* 8.5* 8.0* 7.8* 7.4*  HCT 31.6* 29.3* 27.1* 26.5* 25.9*  MCV 101.3* 101.7* 101.5* 100.4* 102.4*  PLT 132* 115* 88* 93* 95*   Basic Metabolic Panel: Recent Labs  Lab 08/02/18 1918 08/03/18 0256 08/04/18 0532 08/05/18 0821 08/06/18 0708  NA 138 136 135 136 139  K 3.2* 3.7 3.9 4.2 4.2  CL 104 106 104 105 109  CO2 '26 23 24 23 24  '$ GLUCOSE 104* 116* 232* 239* 226*  BUN 19 20 27* 32* 40*  CREATININE 1.65* 1.64* 1.37* 1.57* 1.45*  CALCIUM 7.9* 7.6* 8.0*  7.9* 7.8*  MG  --   --   --  2.3 2.3   GFR: Estimated Creatinine Clearance: 34.6 mL/min (A) (by C-G formula based on SCr of 1.45 mg/dL (H)). Liver Function Tests: Recent Labs  Lab 08/02/18 1918 08/03/18 0256  AST 24 66*  ALT 14 18  ALKPHOS 111 89  BILITOT 0.8 1.0  PROT 6.8 6.3*  ALBUMIN 2.7* 2.5*   No results for input(s): LIPASE, AMYLASE in the last 168 hours. No results for input(s): AMMONIA in the last 168 hours. Coagulation Profile: No results for input(s): INR, PROTIME in the last 168 hours. Cardiac Enzymes: No results for input(s): CKTOTAL, CKMB, CKMBINDEX, TROPONINI in the last 168 hours. BNP (last 3 results) No results for input(s): PROBNP in the last 8760 hours. HbA1C: No results for input(s): HGBA1C in the last 72 hours. CBG: Recent Labs  Lab 08/05/18 1144 08/05/18 1634 08/05/18 2139 08/06/18 0727 08/06/18 1138  GLUCAP 256* 244* 251* 190* 242*   Lipid Profile: No results for input(s): CHOL, HDL, LDLCALC, TRIG, CHOLHDL, LDLDIRECT in the last 72 hours. Thyroid Function Tests: No results for input(s): TSH, T4TOTAL, FREET4, T3FREE, THYROIDAB in the last 72 hours. Anemia Panel: No results for input(s): VITAMINB12, FOLATE, FERRITIN, TIBC, IRON, RETICCTPCT in the last 72 hours. Sepsis Labs: Recent Labs  Lab 08/02/18 1929 08/03/18  0256  LATICACIDVEN 2.12* 0.9    Recent Results (from the past 240 hour(s))  Blood Culture (routine x 2)     Status: Abnormal   Collection Time: 08/02/18  7:18 PM  Result Value Ref Range Status   Specimen Description   Final    BLOOD RIGHT ANTECUBITAL Performed at [facility], [address], [place], [place] [address]    Special Requests   Final    BOTTLES DRAWN AEROBIC AND ANAEROBIC Blood Culture adequate volume Performed at [facility], [address], [place], [place] [address]    Culture  Setup Time   Final    GRAM POSITIVE COCCI AEROBIC AND ANAEROBIC Gram Stain Report Called to,Read Back By and Verified With: COX D.  AT 0910A ON [id number] BY THOMPSON S. CRITICAL RESULT CALLED TO, READ BACK BY AND VERIFIED WITH: HURTH PHARMD AT 1440 ON [id number] BY SJW Performed at [facility], [place] [address] [place], [place] [address]    Culture STAPHYLOCOCCUS AUREUS (A)  Final   Report Status 08/05/2018 FINAL  Final   Organism ID, Bacteria STAPHYLOCOCCUS AUREUS  Final      Susceptibility   Staphylococcus aureus - MIC*    CIPROFLOXACIN <=0.5 SENSITIVE Sensitive     ERYTHROMYCIN <=0.25 SENSITIVE Sensitive     GENTAMICIN <=0.5 SENSITIVE Sensitive     OXACILLIN <=0.25 SENSITIVE Sensitive     TETRACYCLINE <=1 SENSITIVE Sensitive     VANCOMYCIN 1 SENSITIVE Sensitive     TRIMETH/SULFA <=10 SENSITIVE Sensitive     CLINDAMYCIN <=0.25 SENSITIVE Sensitive     RIFAMPIN <=0.5 SENSITIVE Sensitive     Inducible Clindamycin NEGATIVE Sensitive     * STAPHYLOCOCCUS AUREUS  Blood Culture ID Panel (Reflexed)     Status: Abnormal   Collection Time: 08/02/18  7:18 PM  Result Value Ref Range Status   Enterococcus species NOT DETECTED NOT DETECTED Final   Listeria monocytogenes NOT DETECTED NOT DETECTED Final   Staphylococcus species DETECTED (A) NOT DETECTED Final    Comment: CRITICAL RESULT CALLED TO, READ BACK BY AND VERIFIED WITH: HURTH PHARMD AT 1440 ON [id number] BY SJW    Staphylococcus aureus (BCID) DETECTED (A) NOT DETECTED Final    Comment: Methicillin (oxacillin) susceptible Staphylococcus aureus (MSSA). Preferred therapy is anti staphylococcal beta lactam antibiotic (Cefazolin or Nafcillin), unless clinically contraindicated. CRITICAL RESULT CALLED TO, READ BACK BY AND VERIFIED WITH: HURTH PHARMD AT 1440 ON [id number] BY SJW    Methicillin resistance NOT DETECTED NOT DETECTED Final   Streptococcus species NOT DETECTED NOT DETECTED Final   Streptococcus agalactiae NOT DETECTED NOT DETECTED Final   Streptococcus pneumoniae NOT DETECTED NOT DETECTED Final   Streptococcus pyogenes NOT DETECTED NOT DETECTED Final    Acinetobacter baumannii NOT DETECTED NOT DETECTED Final   Enterobacteriaceae species NOT DETECTED NOT DETECTED Final   Enterobacter cloacae complex NOT DETECTED NOT DETECTED Final   Escherichia coli NOT DETECTED NOT DETECTED Final   Klebsiella oxytoca NOT DETECTED NOT DETECTED Final   Klebsiella pneumoniae NOT DETECTED NOT DETECTED Final   Proteus species NOT DETECTED NOT DETECTED Final   Serratia marcescens NOT DETECTED NOT DETECTED Final   Haemophilus influenzae NOT DETECTED NOT DETECTED Final   Neisseria meningitidis NOT DETECTED NOT DETECTED Final   Pseudomonas aeruginosa NOT DETECTED NOT DETECTED Final   Candida albicans NOT DETECTED NOT DETECTED Final   Candida glabrata NOT DETECTED NOT DETECTED Final   Candida krusei NOT DETECTED NOT DETECTED Final   Candida parapsilosis NOT DETECTED NOT DETECTED  Final   Candida tropicalis NOT DETECTED NOT DETECTED Final    Comment: Performed at [facility], [place] [address] [place], [place] [address]  Blood Culture (routine x 2)     Status: Abnormal   Collection Time: 08/02/18  8:13 PM  Result Value Ref Range Status   Specimen Description   Final    BLOOD LEFT HAND Performed at [facility], [address] [place], [place] [address]    Special Requests   Final    AEROBIC BOTTLE ONLY Blood Culture adequate volume Performed at [facility], [address], [place], [place] [address]    Culture  Setup Time   Final    GRAM POSITIVE COCCI AEROBIC BOTTLE ONLY Gram Stain Report Called to,Read Back By and Verified With: COX D. AT 0910A ON [id number] BY THOMPSON S. CRITICAL VALUE NOTED.  VALUE IS CONSISTENT WITH PREVIOUSLY REPORTED AND CALLED VALUE.    Culture (A)  Final    STAPHYLOCOCCUS AUREUS SUSCEPTIBILITIES PERFORMED ON PREVIOUS CULTURE WITHIN THE LAST 5 DAYS. Performed at [facility], [place] [address] [place], [place] [address]    Report Status 08/05/2018 FINAL  Final  Culture, blood (routine x 2)     Status: None (Preliminary  result)   Collection Time: 08/04/18  5:32 AM  Result Value Ref Range Status   Specimen Description BLOOD RIGHT ARM  Final   Special Requests   Final    BOTTLES DRAWN AEROBIC ONLY Blood Culture results may not be optimal due to an excessive volume of blood received in culture bottles   Culture   Final    NO GROWTH 2 DAYS Performed at [facility], [address] [place], [place] [address]    Report Status PENDING  Incomplete  Culture, blood (routine x 2)     Status: None (Preliminary result)   Collection Time: 08/04/18  6:43 AM  Result Value Ref Range Status   Specimen Description BLOOD LEFT ARM  Final   Special Requests   Final    BOTTLES DRAWN AEROBIC ONLY Blood Culture results may not be optimal due to an inadequate volume of blood received in culture bottles   Culture   Final    NO GROWTH 2 DAYS Performed at [facility], [address], [place], [place] [address]    Report Status PENDING  Incomplete    Radiology Studies: No results found.  Scheduled Meds: . atorvastatin  80 mg Oral q1800  . budesonide (PULMICORT) nebulizer solution  0.5 mg Nebulization BID  . cilostazol  100 mg Oral BID  . clopidogrel  75 mg Oral Daily  . enoxaparin (LOVENOX) injection  40 mg Subcutaneous Q24H  . insulin aspart  0-9 Units Subcutaneous TID WC  . insulin glargine  12 Units Subcutaneous Daily  . ipratropium-albuterol  3 mL Nebulization Q6H WA  . losartan  100 mg Oral Daily  . methylPREDNISolone sodium succinate  40 mg Intravenous Q12H  . metoprolol succinate  75 mg Oral BID  . nystatin   Topical TID  . sodium chloride flush  3 mL Intravenous Q12H   Continuous Infusions: . sodium chloride    .  ceFAZolin (ANCEF) IV Stopped (08/05/18 2258)     LOS: 2 days   Time spent= 28 mins  Andry Bogden, MD [facility]  If 7PM-7AM, please contact night-coverage [email or web address] 08/06/2018, 12:02 PM

## 2018-08-06 NOTE — Progress Notes (Signed)
Call received from Dr. Darrick Meigs and ordered to monitor patient, no new orders received at this time.  P.J. Linus Mako, RN

## 2018-08-07 ENCOUNTER — Inpatient Hospital Stay (HOSPITAL_COMMUNITY): Payer: Medicare Other

## 2018-08-07 LAB — GLUCOSE, CAPILLARY
GLUCOSE-CAPILLARY: 238 mg/dL — AB (ref 70–99)
Glucose-Capillary: 248 mg/dL — ABNORMAL HIGH (ref 70–99)
Glucose-Capillary: 229 mg/dL — ABNORMAL HIGH (ref 70–99)
Glucose-Capillary: 203 mg/dL — ABNORMAL HIGH (ref 70–99)

## 2018-08-07 LAB — MAGNESIUM: Magnesium: 2.4 mg/dL (ref 1.7–2.4)

## 2018-08-07 LAB — CBC
HEMATOCRIT: 28.2 % — AB (ref 36.0–46.0)
Hemoglobin: 8.4 g/dL — ABNORMAL LOW (ref 12.0–15.0)
MCH: 30.2 pg (ref 26.0–34.0)
MCHC: 29.8 g/dL — ABNORMAL LOW (ref 30.0–36.0)
MCV: 101.4 fL — ABNORMAL HIGH (ref 80.0–100.0)
Platelets: 104 10*3/uL — ABNORMAL LOW (ref 150–400)
RBC: 2.78 MIL/uL — ABNORMAL LOW (ref 3.87–5.11)
RDW: 15.2 % (ref 11.5–15.5)
WBC: 11.2 10*3/uL — ABNORMAL HIGH (ref 4.0–10.5)
nRBC: 0 % (ref 0.0–0.2)

## 2018-08-07 LAB — BASIC METABOLIC PANEL
Anion gap: 8 (ref 5–15)
BUN: 42 mg/dL — AB (ref 8–23)
CHLORIDE: 105 mmol/L (ref 98–111)
CO2: 24 mmol/L (ref 22–32)
Calcium: 7.9 mg/dL — ABNORMAL LOW (ref 8.9–10.3)
Creatinine, Ser: 1.48 mg/dL — ABNORMAL HIGH (ref 0.44–1.00)
GFR calc Af Amer: 38 mL/min — ABNORMAL LOW (ref >60)
GFR calc non Af Amer: 33 mL/min — ABNORMAL LOW (ref >60)
Glucose, Bld: 242 mg/dL — ABNORMAL HIGH (ref 70–99)
Potassium: 4.5 mmol/L (ref 3.5–5.1)
SODIUM: 137 mmol/L (ref 135–145)

## 2018-08-07 MED ORDER — SODIUM CHLORIDE 0.9% FLUSH: 10.0000 mL | INTRAVENOUS | Status: DC | PRN

## 2018-08-07 MED ORDER — FUROSEMIDE 10 MG/ML IJ SOLN
40.0000 mg | Freq: Once | INTRAMUSCULAR | Status: AC
  Administered 2018-08-07: 40 mg via INTRAVENOUS
  Filled 2018-08-07: qty 4

## 2018-08-07 MED ORDER — INSULIN ASPART 100 UNIT/ML ~~LOC~~ SOLN
8.0000 [IU] | Freq: Three times a day (TID) | SUBCUTANEOUS | Status: DC
  Administered 2018-08-07 – 2018-08-08 (×3): 8 [IU] via SUBCUTANEOUS

## 2018-08-07 MED ORDER — INSULIN ASPART 100 UNIT/ML ~~LOC~~ SOLN: 6.0000 [IU] | Freq: Three times a day (TID) | SUBCUTANEOUS | Status: DC

## 2018-08-07 MED ORDER — CEFAZOLIN SODIUM-DEXTROSE 2-4 GM/100ML-% IV SOLN
2.0000 g | Freq: Three times a day (TID) | INTRAVENOUS | Status: DC
  Administered 2018-08-07 – 2018-08-10 (×9): 2 g via INTRAVENOUS
  Filled 2018-08-07 (×12): qty 100

## 2018-08-07 MED ORDER — INSULIN GLARGINE 100 UNIT/ML ~~LOC~~ SOLN
15.0000 [IU] | Freq: Every day | SUBCUTANEOUS | Status: DC
  Filled 2018-08-07: qty 0.15

## 2018-08-07 NOTE — Progress Notes (Signed)
Patient ID: [name] [name], female   DOB: [birth year], 83 y.o.   MRN: [id number]          [facility]    Date of Admission:  08/02/2018    Total days of antibiotics 6        Day 5 cefazolin  [name] has community-acquired MSSA bacteremia.  So far it appears that the source is unclear.  She has defervesced rapidly on antibiotics.  Repeat blood cultures are negative at 72 hours.  TTE shows no evidence of endocarditis.  I do not feel that she needs TEE.  It is safe to go ahead and have a PICC placed.  I recommend continuing cefazolin for a total of 4 weeks.  I will sign off now.  Diagnosis: Bacteremia  Culture Result: MSSA  No Known Allergies  OPAT Orders Discharge antibiotics: Per pharmacy protocol cefazolin  Duration: 4 weeks End Date: 08/29/2018  [facility] Care Per Protocol:  Labs weekly while on IV antibiotics: _x_ CBC with differential _x_ BMP __ CMP __ CRP __ ESR __ Vancomycin trough __ CK  _x_ Please pull PIC at completion of IV antibiotics __ Please leave PIC in place until doctor has seen patient or been notified  Clinic Follow Up Appt: With PCP          Michel Bickers, MD [facility] [facility] [phone] pager   [phone] cell 08/07/2018, 9:41 AM

## 2018-08-07 NOTE — Progress Notes (Signed)
PHARMACY CONSULT NOTE FOR:  OUTPATIENT  PARENTERAL ANTIBIOTIC THERAPY (OPAT)  Indication: MSSA bacteremia Regimen: Cefazolin 2 gm every 8 hours End date: 08/29/2018  IV antibiotic discharge orders are pended. To discharging provider:  please sign these orders via discharge navigator,  Select New Orders & click on the button choice - Manage This Unsigned Work.     Thank you for allowing pharmacy to be a part of this patient's care.  Jimmy Footman, PharmD, BCPS, [facility] Clinical Pharmacist Phone: [phone] 08/07/2018, 2:06 PM

## 2018-08-07 NOTE — Care Management Important Message (Signed)
Important Message  Patient Details  Name: [name] [name] MRN: [id number] Date of Birth: 1935-09-20   Medicare Important Message Given:  Yes    Shelda Altes 08/07/2018, 1:59 PM

## 2018-08-07 NOTE — Clinical Social Work Placement (Signed)
   CLINICAL SOCIAL WORK PLACEMENT  NOTE  Date:  08/07/2018  Patient Details  Name: [name] [name] MRN: [id number] Date of Birth: Sep 02, 1935  Clinical Social Work is seeking post-discharge placement for this patient at the [place] level of care (*CSW will initial, date and re-position this form in  chart as items are completed):  Yes   Patient/family provided with [facility] Work Department's list of facilities offering this level of care within the geographic area requested by the patient (or if unable, by the patient's family).  Yes   Patient/family informed of their freedom to choose among providers that offer the needed level of care, that participate in Medicare, Medicaid or managed care program needed by the patient, have an available bed and are willing to accept the patient.  Yes   Patient/family informed of [facility]'s ownership interest in [facility] and [facility], as well as of the fact that they are under no obligation to receive care at these facilities.  PASRR submitted to EDS on 08/07/18     PASRR number received on 08/07/18     Existing PASRR number confirmed on       FL2 transmitted to all facilities in geographic area requested by pt/family on 08/07/18     FL2 transmitted to all facilities within larger geographic area on       Patient informed that his/her managed care company has contracts with or will negotiate with certain facilities, including the following:            Patient/family informed of bed offers received.  Patient chooses bed at       Physician recommends and patient chooses bed at      Patient to be transferred to   on  .  Patient to be transferred to facility by       Patient family notified on   of transfer.  Name of family member notified:        PHYSICIAN       Additional Comment: [place], [place] and [facility] in  [place].   _______________________________________________ Trish Mage, [place] 08/07/2018, 5:19 PM

## 2018-08-07 NOTE — Progress Notes (Signed)
Pharmacy Antibiotic Note  [name] [name] is a 83 y.o. female admitted on 08/02/2018 with MSSA bacteremia.  Pharmacy has been consulted for cefazolin dosing. Crcl improving, once crcl >46mls/min will increase cefazolin to Q8H. afebrile.  WBC 18.2>> 17.1>> 15.9.   TTE shows no evidence of endocarditis. Plan is for 3-4 weeks IV abx. May not require TEE  Plan: Continue cefazolin 2gm IV q8h,  Continue to monitor V/S,labs, and clinical progress F/U cxs Notes weekly  Height: 5' (152.4 cm) Weight: 240 lb (108.9 kg) IBW/kg (Calculated) : 45.5  Temp (24hrs), Avg:98.1 F (36.7 C), Min:97.8 F (36.6 C), Max:98.2 F (36.8 C)  Recent Labs  Lab 08/02/18 1929 08/03/18 0256 08/04/18 0532 08/05/18 0821 08/06/18 0708 08/07/18 0532  WBC  --  18.2* 17.1* 15.9* 11.5* 11.2*  CREATININE  --  1.64* 1.37* 1.57* 1.45* 1.48*  LATICACIDVEN 2.12* 0.9  --   --   --   --     Estimated Creatinine Clearance: 33.9 mL/min (A) (by C-G formula based on SCr of 1.48 mg/dL (H)).    No Known Allergies  Antimicrobials this admission: Cefazolin 1/9 >> 1/8 cefepime >> 1/9 1/8 vancomycin >> 1/9 1/8 metronidazole >> 1/9  Dose change: 1/13 Increase cefazolin 2gm IV q8h  Microbiology results: 1/8 BCx: gram positive cocci - MSSA 1/10 BCX: ngtd  Thank you for allowing pharmacy to be a part of this patient's care.  Isac Sarna, BS [place], [place] Clinical Pharmacist Pager [phone] 08/07/2018 2:35 PM

## 2018-08-07 NOTE — Plan of Care (Signed)
  Problem: Acute Rehab OT Goals (only OT should resolve) Goal: Pt. Will Perform Grooming Flowsheets (Taken 08/07/2018 1032) Pt Will Perform Grooming: with modified independence; standing Goal: Pt. Will Perform Upper Body Bathing Flowsheets (Taken 08/07/2018 1032) Pt Will Perform Upper Body Bathing: with set-up; sitting Goal: Pt. Will Perform Lower Body Bathing Flowsheets (Taken 08/07/2018 1032) Pt Will Perform Lower Body Bathing: with min assist; sitting/lateral leans; sit to/from stand Goal: Pt. Will Transfer To Toilet Flowsheets (Taken 08/07/2018 1032) Pt Will Transfer to Toilet: with supervision; ambulating; bedside commode; regular height toilet Goal: Pt. Will Perform Toileting-Clothing Manipulation Flowsheets (Taken 08/07/2018 1032) Pt Will Perform Toileting - Clothing Manipulation and hygiene: with supervision; sit to/from stand; sitting/lateral leans Goal: Pt/Caregiver Will Perform Home Exercise Program Flowsheets (Taken 08/07/2018 1032) Pt/caregiver will Perform Home Exercise Program: Increased strength; Both right and left upper extremity; With written HEP provided

## 2018-08-07 NOTE — Progress Notes (Signed)
PROGRESS NOTE   ANAI LIPSON  NKN:[id number] DOB: February 28, 1936 DOA: 08/02/2018 PCP: Dettinger, Fransisca Kaufmann, MD   Brief Narrative:  83 year old female with history of coronary artery disease, essential hypertension, hyperlipidemia, peripheral vascular disease came to the hospital with complaints of generalized weakness.  Patient reports she experienced fall at home and was unable to get help for some time.  When EMS arrived she was noted to be hypoxic.  She was admitted to the hospital for COPD exacerbation and met Sirs criteria.  Her blood cultures ended up growing gram-positive cocci-MSSA.  Trans-esophageal Echocardiogram was negative.  Assessment & Plan:   Principal Problem:   MRSA bacteremia Active Problems:   Type 2 diabetes mellitus with other specified complication ([place])   Morbid obesity ([place])   SMOKER   Hypertension associated with diabetes ([place])   Coronary atherosclerosis   PVD   Venous (peripheral) insufficiency   CKD (chronic kidney disease), stage III (HCC)   COPD exacerbation ([place])   COPD with acute exacerbation (HCC)   Pressure injury of skin  Gram-positive cocci bacteremia, MSSA -Repeat blood cultures 1/10-no growth to date (48 hours). Will follow.  -Trans-esophageal Echocardiogram-negative for any evidence of endocarditis - Appreciate input from infectious disease team - For now hold off on TEE, if repeat cultures are positive then we may have to proceed with it otherwise empirically treat with antibiotics for 4 weeks. Per ID Dr Megan Salon: End date 08/29/2018  Stage 3 CKD - following renal function panel.  acute COPD exacerbation - Increase Solu-Medrol 60 mg every 8 hours.  Bronchodilators scheduled and as needed - Continue Pulmicort twice daily - Ordered incentive spirometry and flutter valve - Supplemental oxygen as needed. - Flu swab is negative  Diabetes mellitus type 2 with hyperglycemia -Hemoglobin A1c 6.3.  Hold off on oral medication.  Continue insulin sliding  scale and Accu-Chek, increase Lantus to 14 units daily. Titrated prandial coverage.    CBG (last 3)  Recent Labs    08/06/18 2307 08/07/18 0743 08/07/18 1123  GLUCAP 265* 238* 248*   Peripheral chill disease -Continue cilostazol  Coronary artery disease -Currently patient is chest pain-free.  Continue home medication Plavix, statin, Toprol-XL.  Continue losartan 100 mg daily  DVT prophylaxis: Lovenox Code Status: DNR Family Communication: Daughter at bedside Disposition Plan: SNF when bed available.   Consultants:   Infectious disease  Procedures:   None  Antimicrobials:   Ancef  Subjective: Patient continues to have SOB with exertion but overall some better.  She has agreed to go to SNF as recommended by PT.  Daughter at bedside in agreement.   Review of Systems Otherwise negative except as per HPI, including: General = no fevers, chills, dizziness, malaise, fatigue HEENT/EYES = negative for pain, redness, loss of vision, double vision, blurred vision, loss of hearing, sore throat, hoarseness, dysphagia Cardiovascular= negative for chest pain, palpitation, murmurs, lower extremity swelling Respiratory/lungs= negative for shortness of breath, cough, hemoptysis, wheezing, mucus production Gastrointestinal= negative for nausea, vomiting,, abdominal pain, melena, hematemesis Genitourinary= negative for Dysuria, Hematuria, Change in Urinary Frequency MSK = Negative for arthralgia, myalgias, Back Pain, Joint swelling  Neurology= Negative for headache, seizures, numbness, tingling  Psychiatry= Negative for anxiety, depression, suicidal and homocidal ideation Allergy/Immunology= Medication/Food allergy as listed  Skin= Negative for Rash, lesions, ulcers, itching  Objective: Vitals:   08/06/18 2017 08/06/18 2124 08/07/18 0428 08/07/18 0813  BP:  (!) 143/50 126/63   Pulse:  79 71   Resp:  20 16   Temp:  98.2 F (36.8 C) 97.8 F (36.6 C)   TempSrc:  Oral Oral   SpO2:  98% 98% 98% 96%  Weight:      Height:        Intake/Output Summary (Last 24 hours) at 08/07/2018 1306 Last data filed at 08/07/2018 1208 Gross per 24 hour  Intake 1063 ml  Output 400 ml  Net 663 ml   Filed Weights   08/02/18 1917  Weight: 108.9 kg    Examination:  Constitutional: NAD, calm, comfortable Eyes: PERRL, lids and conjunctivae normal ENMT: Mucous membranes are moist. Posterior pharynx clear of any exudate or lesions.Normal dentition.  Neck: normal, supple, no masses, no thyromegaly Respiratory: bilateral BS with less wheezing as yesterday.  Cardiovascular: Regular rate and rhythm, no murmurs / rubs / gallops. No extremity edema. 2+ pedal pulses. No carotid bruits.  Abdomen: no tenderness, no masses palpated. No hepatosplenomegaly. Bowel sounds positive.  Musculoskeletal: no clubbing / cyanosis. No joint deformity upper and lower extremities. Good ROM, no contractures. Normal muscle tone.  Skin: no rashes, lesions, ulcers. No induration Neurologic: CN 2-12 grossly intact. Sensation intact, DTR normal. Strength 4/5 in all 4.  Psychiatric: Normal judgment and insight. Alert and oriented x 3. Normal mood.   Data Reviewed:   CBC: Recent Labs  Lab 08/02/18 1918 08/03/18 0256 08/04/18 0532 08/05/18 0821 08/06/18 0708 08/07/18 0532  WBC 13.5* 18.2* 17.1* 15.9* 11.5* 11.2*  NEUTROABS 12.4*  --  15.9*  --   --   --   HGB 9.3* 8.5* 8.0* 7.8* 7.4* 8.4*  HCT 31.6* 29.3* 27.1* 26.5* 25.9* 28.2*  MCV 101.3* 101.7* 101.5* 100.4* 102.4* 101.4*  PLT 132* 115* 88* 93* 95* 818*   Basic Metabolic Panel: Recent Labs  Lab 08/03/18 0256 08/04/18 0532 08/05/18 0821 08/06/18 0708 08/07/18 0532  NA 136 135 136 139 137  K 3.7 3.9 4.2 4.2 4.5  CL 106 104 105 109 105  CO2 _0 GLUCOSE 116* 232* 239* 226* 242*  BUN 20 27* 32* 40* 42*  CREATININE 1.64* 1.37* 1.57* 1.45* 1.48*  CALCIUM 7.6* 8.0* 7.9* 7.8* 7.9*  MG  --   --  2.3 2.3 2.4   GFR: Estimated Creatinine  Clearance: 33.9 mL/min (A) (by C-G formula based on SCr of 1.48 mg/dL (H)). Liver Function Tests: Recent Labs  Lab 08/02/18 1918 08/03/18 0256  AST 24 66*  ALT 14 18  ALKPHOS 111 89  BILITOT 0.8 1.0  PROT 6.8 6.3*  ALBUMIN 2.7* 2.5*   No results for input(s): LIPASE, AMYLASE in the last 168 hours. No results for input(s): AMMONIA in the last 168 hours. Coagulation Profile: No results for input(s): INR, PROTIME in the last 168 hours. Cardiac Enzymes: No results for input(s): CKTOTAL, CKMB, CKMBINDEX, TROPONINI in the last 168 hours. BNP (last 3 results) No results for input(s): PROBNP in the last 8760 hours. HbA1C: No results for input(s): HGBA1C in the last 72 hours. CBG: Recent Labs  Lab 08/06/18 1644 08/06/18 2119 08/06/18 2307 08/07/18 0743 08/07/18 1123  GLUCAP 248* 291* 265* 238* 248*   Lipid Profile: No results for input(s): CHOL, HDL, LDLCALC, TRIG, CHOLHDL, LDLDIRECT in the last 72 hours. Thyroid Function Tests: No results for input(s): TSH, T4TOTAL, FREET4, T3FREE, THYROIDAB in the last 72 hours. Anemia Panel: No results for input(s): VITAMINB12, FOLATE, FERRITIN, TIBC, IRON, RETICCTPCT in the last 72 hours. Sepsis Labs: Recent Labs  Lab 08/02/18 1929 08/03/18 0256  LATICACIDVEN 2.12* 0.9  Recent Results (from the past 240 hour(s))  Blood Culture (routine x 2)     Status: Abnormal   Collection Time: 08/02/18  7:18 PM  Result Value Ref Range Status   Specimen Description   Final    BLOOD RIGHT ANTECUBITAL Performed at [facility], [address] [place], [place] [address]    Special Requests   Final    BOTTLES DRAWN AEROBIC AND ANAEROBIC Blood Culture adequate volume Performed at [facility], [address], [place], [place] [address]    Culture  Setup Time   Final    GRAM POSITIVE COCCI AEROBIC AND ANAEROBIC Gram Stain Report Called to,Read Back By and Verified With: COX D. AT 0910A ON [id number] BY THOMPSON S. CRITICAL RESULT CALLED TO, READ  BACK BY AND VERIFIED WITH: HURTH PHARMD AT 1440 ON [id number] BY SJW Performed at [facility], [place] [address], [place], [place] [address]    Culture STAPHYLOCOCCUS AUREUS (A)  Final   Report Status 08/05/2018 FINAL  Final   Organism ID, Bacteria STAPHYLOCOCCUS AUREUS  Final      Susceptibility   Staphylococcus aureus - MIC*    CIPROFLOXACIN <=0.5 SENSITIVE Sensitive     ERYTHROMYCIN <=0.25 SENSITIVE Sensitive     GENTAMICIN <=0.5 SENSITIVE Sensitive     OXACILLIN <=0.25 SENSITIVE Sensitive     TETRACYCLINE <=1 SENSITIVE Sensitive     VANCOMYCIN 1 SENSITIVE Sensitive     TRIMETH/SULFA <=10 SENSITIVE Sensitive     CLINDAMYCIN <=0.25 SENSITIVE Sensitive     RIFAMPIN <=0.5 SENSITIVE Sensitive     Inducible Clindamycin NEGATIVE Sensitive     * STAPHYLOCOCCUS AUREUS  Blood Culture ID Panel (Reflexed)     Status: Abnormal   Collection Time: 08/02/18  7:18 PM  Result Value Ref Range Status   Enterococcus species NOT DETECTED NOT DETECTED Final   Listeria monocytogenes NOT DETECTED NOT DETECTED Final   Staphylococcus species DETECTED (A) NOT DETECTED Final    Comment: CRITICAL RESULT CALLED TO, READ BACK BY AND VERIFIED WITH: HURTH PHARMD AT 1440 ON [id number] BY SJW    Staphylococcus aureus (BCID) DETECTED (A) NOT DETECTED Final    Comment: Methicillin (oxacillin) susceptible Staphylococcus aureus (MSSA). Preferred therapy is anti staphylococcal beta lactam antibiotic (Cefazolin or Nafcillin), unless clinically contraindicated. CRITICAL RESULT CALLED TO, READ BACK BY AND VERIFIED WITH: HURTH PHARMD AT 1440 ON [id number] BY SJW    Methicillin resistance NOT DETECTED NOT DETECTED Final   Streptococcus species NOT DETECTED NOT DETECTED Final   Streptococcus agalactiae NOT DETECTED NOT DETECTED Final   Streptococcus pneumoniae NOT DETECTED NOT DETECTED Final   Streptococcus pyogenes NOT DETECTED NOT DETECTED Final   Acinetobacter baumannii NOT DETECTED NOT DETECTED Final   Enterobacteriaceae  species NOT DETECTED NOT DETECTED Final   Enterobacter cloacae complex NOT DETECTED NOT DETECTED Final   Escherichia coli NOT DETECTED NOT DETECTED Final   Klebsiella oxytoca NOT DETECTED NOT DETECTED Final   Klebsiella pneumoniae NOT DETECTED NOT DETECTED Final   Proteus species NOT DETECTED NOT DETECTED Final   Serratia marcescens NOT DETECTED NOT DETECTED Final   Haemophilus influenzae NOT DETECTED NOT DETECTED Final   Neisseria meningitidis NOT DETECTED NOT DETECTED Final   Pseudomonas aeruginosa NOT DETECTED NOT DETECTED Final   Candida albicans NOT DETECTED NOT DETECTED Final   Candida glabrata NOT DETECTED NOT DETECTED Final   Candida krusei NOT DETECTED NOT DETECTED Final   Candida parapsilosis NOT DETECTED NOT DETECTED Final   Candida tropicalis NOT DETECTED NOT  DETECTED Final    Comment: Performed at [facility], [place] [address], [place], [place] [address]  Blood Culture (routine x 2)     Status: Abnormal   Collection Time: 08/02/18  8:13 PM  Result Value Ref Range Status   Specimen Description   Final    BLOOD LEFT HAND Performed at [facility], [address] [place], [place] [address]    Special Requests   Final    AEROBIC BOTTLE ONLY Blood Culture adequate volume Performed at [facility], [address], [place], [place] [address]    Culture  Setup Time   Final    GRAM POSITIVE COCCI AEROBIC BOTTLE ONLY Gram Stain Report Called to,Read Back By and Verified With: COX D. AT 0910A ON [id number] BY THOMPSON S. CRITICAL VALUE NOTED.  VALUE IS CONSISTENT WITH PREVIOUSLY REPORTED AND CALLED VALUE.    Culture (A)  Final    STAPHYLOCOCCUS AUREUS SUSCEPTIBILITIES PERFORMED ON PREVIOUS CULTURE WITHIN THE LAST 5 DAYS. Performed at [facility], [place] [address], [place], [place] [address]    Report Status 08/05/2018 FINAL  Final  Culture, blood (routine x 2)     Status: None (Preliminary result)   Collection Time: 08/04/18  5:32 AM  Result Value Ref Range Status    Specimen Description BLOOD RIGHT ARM  Final   Special Requests   Final    BOTTLES DRAWN AEROBIC ONLY Blood Culture results may not be optimal due to an excessive volume of blood received in culture bottles   Culture   Final    NO GROWTH 3 DAYS Performed at [facility], [address], [place], [place] [address]    Report Status PENDING  Incomplete  Culture, blood (routine x 2)     Status: None (Preliminary result)   Collection Time: 08/04/18  6:43 AM  Result Value Ref Range Status   Specimen Description BLOOD LEFT ARM  Final   Special Requests   Final    BOTTLES DRAWN AEROBIC ONLY Blood Culture results may not be optimal due to an inadequate volume of blood received in culture bottles   Culture   Final    NO GROWTH 3 DAYS Performed at [facility], [address] [place], [place] [address]    Report Status PENDING  Incomplete    Radiology Studies: No results found.  Scheduled Meds: . atorvastatin  80 mg Oral q1800  . budesonide (PULMICORT) nebulizer solution  0.5 mg Nebulization BID  . cilostazol  100 mg Oral BID  . clopidogrel  75 mg Oral Daily  . enoxaparin (LOVENOX) injection  40 mg Subcutaneous Q24H  . insulin aspart  0-20 Units Subcutaneous TID WC  . insulin aspart  0-5 Units Subcutaneous QHS  . insulin aspart  4 Units Subcutaneous TID WC  . insulin glargine  12 Units Subcutaneous Daily  . ipratropium-albuterol  3 mL Nebulization Q6H WA  . losartan  100 mg Oral Daily  . methylPREDNISolone sodium succinate  60 mg Intravenous Q8H  . metoprolol succinate  75 mg Oral BID  . nystatin   Topical TID  . sodium chloride flush  3 mL Intravenous Q12H   Continuous Infusions: . sodium chloride    .  ceFAZolin (ANCEF) IV 2 g (08/07/18 1116)     LOS: 3 days   Time spent= 28 mins  Clanford Johnson, MD [facility]  If 7PM-7AM, please contact night-coverage [email or web address] 08/07/2018, 1:06 PM

## 2018-08-07 NOTE — Progress Notes (Signed)
Received call from IV team nurse stating PICC line is okay to be used now. Donavan Foil, RN

## 2018-08-07 NOTE — Progress Notes (Signed)
Peripherally Inserted Central Catheter/Midline Placement  The IV Nurse has discussed with the patient and/or persons authorized to consent for the patient, the purpose of this procedure and the potential benefits and risks involved with this procedure.  The benefits include less needle sticks, lab draws from the catheter, and the patient may be discharged home with the catheter. Risks include, but not limited to, infection, bleeding, blood clot (thrombus formation), and puncture of an artery; nerve damage and irregular heartbeat and possibility to perform a PICC exchange if needed/ordered by physician.  Alternatives to this procedure were also discussed.  Bard Power PICC patient education guide, fact sheet on infection prevention and patient information card has been provided to patient /or left at bedside.    PICC/Midline Placement Documentation  PICC Single Lumen 08/07/18 PICC Right Cephalic 38 cm 0 cm (Active)  Indication for Insertion or Continuance of Line Prolonged intravenous therapies 08/07/2018  3:13 PM  Exposed Catheter (cm) 0 cm 08/07/2018  3:13 PM  Site Assessment Clean;Dry;Intact 08/07/2018  3:13 PM  Line Status Flushed;Blood return noted;Saline locked 08/07/2018  3:13 PM  Dressing Type Transparent 08/07/2018  3:13 PM  Dressing Status Clean;Intact;Dry 08/07/2018  3:13 PM  Dressing Change Due 08/14/18 08/07/2018  3:13 PM       [address] 08/07/2018, 3:14 PM

## 2018-08-07 NOTE — NC FL2 (Signed)
[place] LEVEL OF CARE SCREENING TOOL     IDENTIFICATION  Patient Name: [name] [name] Birthdate: [birth year] Sex: female Admission Date (Current Location): 08/02/2018  [facility] and [place] Number:  [employer] and Address:  [facility] [address], [place]      Provider Number: [id number]  Attending Physician Name and Address:  Murlean Iba, MD  Relative Name and Phone Number:       Current Level of Care: Hospital Recommended Level of Care: [place] Prior Approval Number:    Date Approved/Denied:   PASRR Number:    Discharge Plan: SNF    Current Diagnoses: Patient Active Problem List   Diagnosis Date Noted  . Pressure injury of skin 08/06/2018  . COPD with acute exacerbation ([place]) 08/04/2018  . COPD exacerbation ([place]) 08/03/2018  . MRSA bacteremia 08/03/2018  . Bilateral carotid artery stenosis 09/12/2014  . CKD (chronic kidney disease), stage III ([place]) 11/02/2013  . SMOKER 05/30/2009  . Bilateral carotid bruits 05/30/2009  . Type 2 diabetes mellitus with other specified complication ([place]) 52/77/8242  . Hyperlipidemia associated with type 2 diabetes mellitus ([place]) 05/29/2009  . Morbid obesity ([place]) 05/29/2009  . Hypertension associated with diabetes ([place]) 05/29/2009  . Coronary atherosclerosis 05/29/2009  . PVD 05/29/2009  . Venous (peripheral) insufficiency 05/29/2009  . Edema 05/29/2009    Orientation RESPIRATION BLADDER Height & Weight     Self, Time, Situation, Place  O2(Nasal Cannula, 2L) Continent Weight: 108.9 kg Height:  5' (152.4 cm)  BEHAVIORAL SYMPTOMS/MOOD NEUROLOGICAL BOWEL NUTRITION STATUS  (none) (none) Continent Diet(Carb modified)  AMBULATORY STATUS COMMUNICATION OF NEEDS Skin   Extensive Assist Verbally Bruising(L Buttocks, pressure wound, deep tissue injury)                       Personal Care Assistance Level of Assistance  Bathing, Feeding, Dressing Bathing  Assistance: Limited assistance Feeding assistance: Independent Dressing Assistance: Maximum assistance     Functional Limitations Info  Sight, Hearing, Speech Sight Info: Adequate Hearing Info: Adequate Speech Info: Adequate    SPECIAL CARE FACTORS FREQUENCY  PT (By licensed PT)     PT Frequency: 5X/W              Contractures Contractures Info: Not present    Additional Factors Info  Code Status, Allergies Code Status Info: DNR Allergies Info: NKA           Current Medications (08/07/2018):  This is the current hospital active medication list Current Facility-Administered Medications  Medication Dose Route Frequency Provider Last Rate Last Dose  . 0.9 %  sodium chloride infusion  250 mL Intravenous PRN Oswald Hillock, MD      . atorvastatin (LIPITOR) tablet 80 mg  80 mg Oral q1800 Oswald Hillock, MD   80 mg at 08/06/18 1733  . budesonide (PULMICORT) nebulizer solution 0.5 mg  0.5 mg Nebulization BID Amin, Ankit Chirag, MD   0.5 mg at 08/07/18 0812  . ceFAZolin (ANCEF) IVPB 2g/100 mL premix  2 g Intravenous Q12H Amin, Ankit Chirag, MD 200 mL/hr at 08/07/18 1116 2 g at 08/07/18 1116  . cilostazol (PLETAL) tablet 100 mg  100 mg Oral BID Oswald Hillock, MD   100 mg at 08/07/18 3536  . clopidogrel (PLAVIX) tablet 75 mg  75 mg Oral Daily Oswald Hillock, MD   75 mg at 08/07/18 1443  . enoxaparin (LOVENOX) injection 40 mg  40 mg Subcutaneous Q24H Oswald Hillock, MD   40 mg at 08/07/18 0505  . insulin aspart (novoLOG) injection 0-20 Units  0-20 Units Subcutaneous TID WC Wynetta Emery, Clanford L, MD   7 Units at 08/07/18 0927  . insulin aspart (novoLOG) injection 0-5 Units  0-5 Units Subcutaneous QHS Irwin Brakeman L, MD   3 Units at 08/06/18 2323  . insulin aspart (novoLOG) injection 4 Units  4 Units Subcutaneous TID WC Johnson, Clanford L, MD   4 Units at 08/06/18 1734  . insulin glargine (LANTUS) injection 12 Units  12 Units Subcutaneous Daily Damita Lack, MD   12 Units at  08/07/18 [phone]  . ipratropium-albuterol (DUONEB) 0.5-2.5 (3) MG/3ML nebulizer solution 3 mL  3 mL Nebulization Q6H WA Amin, Ankit Chirag, MD   3 mL at 08/07/18 0812  . LORazepam (ATIVAN) tablet 0.5 mg  0.5 mg Oral QHS PRN Oswald Hillock, MD   0.5 mg at 08/05/18 2231  . losartan (COZAAR) tablet 100 mg  100 mg Oral Daily Oswald Hillock, MD   100 mg at 08/07/18 9604  . methylPREDNISolone sodium succinate (SOLU-MEDROL) 125 mg/2 mL injection 60 mg  60 mg Intravenous Q8H Johnson, Clanford L, MD   60 mg at 08/07/18 0504  . metoprolol succinate (TOPROL-XL) 24 hr tablet 75 mg  75 mg Oral BID Oswald Hillock, MD   75 mg at 08/07/18 5409  . nystatin (MYCOSTATIN/NYSTOP) topical powder   Topical TID Shelly Coss, MD      . sodium chloride flush (NS) 0.9 % injection 3 mL  3 mL Intravenous Q12H Oswald Hillock, MD   3 mL at 08/07/18 0929  . sodium chloride flush (NS) 0.9 % injection 3 mL  3 mL Intravenous PRN Oswald Hillock, MD         Discharge Medications: Please see discharge summary for a list of discharge medications.  Relevant Imaging Results:  Relevant Lab Results:   Additional [place], LCSW

## 2018-08-07 NOTE — Evaluation (Signed)
Occupational Therapy Evaluation Patient Details Name: [name] [name] MRN: [id number] DOB: [birth year] Today's Date: 08/07/2018    History of Present Illness [name] [name]  is a 83 y.o. female, with history of hypertension, hyperlipidemia, CAD, peripheral vascular disease came to hospital with generalized weakness.  Patient says that earlier she fell at home.  She denies passing out but says that she was so weak that she just went down to the floor.  Denies any head injury.  EMS was called and patient was noted to have hypoxemia.   Clinical Impression   Patient in bed upon therapy arrival. Daughter visiting room. Daughter concerned about patient's progress as she is not moving well. OT educated patient and daughter on recommendations for discharge. PT had evaluated patient last week and recommended SNF. Pt was able to transfer from bed to recliner at [facility] assist using walker. Pt was educated and encouraged to remained sitting up in chair for as long as possible to help with her breathing and to increase her endurance and strength. Would like patient to get rid of [place] although with the IV fluids, patient is concerned with how often she will need to use the bathroom. Spoke with nursing and PT regarding patient's chart stating that Home with PT is planned. Daughter would like patient to be discharged to a short term rehab to build up her strength and to complete her antibiotics for her infection. Case Management will be notified by PT/nursing.      Follow Up Recommendations  SNF    Equipment Recommendations  None recommended by OT       Precautions / Restrictions Precautions Precautions: Fall Restrictions Weight Bearing Restrictions: No      Mobility Bed Mobility Overal bed mobility: Needs Assistance Bed Mobility: Supine to Sit     Supine to sit: HOB elevated;Supervision     General bed mobility comments: HOB elevated as patient sleeps in her recliner and does no get in and out of a bed.    Transfers Overall transfer level: Needs assistance Equipment used: Rolling walker (2 wheeled) Transfers: Sit to/from [facility] Sit to Stand: Min guard Stand pivot transfers: Min guard                ADL either performed or assessed with clinical judgement   ADL Overall ADL's : Needs assistance/impaired Eating/Feeding: Modified independent;Sitting                   Lower Body Dressing: Total assistance;Sit to/from stand               Functional mobility during ADLs: Minimal assistance;Cueing for safety;Rolling walker       Vision Baseline Vision/History: No visual deficits Patient Visual Report: No change from baseline              Pertinent Vitals/Pain Pain Assessment: No/denies pain     Hand Dominance Right   Extremity/Trunk Assessment Upper Extremity Assessment Upper Extremity Assessment: Generalized weakness   Lower Extremity Assessment Lower Extremity Assessment: Defer to PT evaluation       Communication Communication Communication: No difficulties   Cognition Arousal/Alertness: Awake/alert Behavior During Therapy: WFL for tasks assessed/performed Overall Cognitive Status: Within Functional Limits for tasks assessed                    Home Living Family/patient expects to be discharged to:: Skilled nursing facility Living Arrangements: Children Available Help at Discharge: Family Type of Home: Mobile home Home Access: Stairs  to enter Entrance Stairs-Number of Steps: 2 Entrance Stairs-Rails: Right;Left Home Layout: One level           Bathroom Accessibility: Yes   Home Equipment: Shower seat          Prior Functioning/Environment Level of Independence: Independent with assistive device(s)        Comments: household ambulator, leans on walls and furniture        OT Problem List: Decreased strength;Decreased knowledge of use of DME or AE      OT Treatment/Interventions: Self-care/ADL  training;Therapeutic exercise;Modalities;Balance training;Neuromuscular education;Therapeutic activities;Energy conservation;DME and/or AE instruction;Manual therapy;Patient/family education    OT Goals(Current goals can be found in the care plan section) Acute Rehab OT Goals Patient Stated Goal: return home OT Goal Formulation: With patient Time For Goal Achievement: Sep 20, 2018 Potential to Achieve Goals: Good  OT Frequency: Min 2X/week   Barriers to D/C: Decreased caregiver support             AM-PAC OT "6 Clicks" Daily Activity     Outcome Measure Help from another person eating meals?: None Help from another person taking care of personal grooming?: A Little Help from another person toileting, which includes using toliet, bedpan, or urinal?: A Lot Help from another person bathing (including washing, rinsing, drying)?: A Lot Help from another person to put on and taking off regular upper body clothing?: A Little Help from another person to put on and taking off regular lower body clothing?: A Lot 6 Click Score: 16   End of Session Equipment Utilized During Treatment: Gait belt;Oxygen;Rolling walker Nurse Communication: Mobility status  Activity Tolerance: Patient tolerated treatment well Patient left: in chair;with call bell/phone within reach;with chair alarm set;with family/visitor present  OT Visit Diagnosis: Muscle weakness (generalized) (M62.81)                Time: 0488-8916 OT Time Calculation (min): 19 min Charges:  OT General Charges $OT Visit: 1 Visit OT Evaluation $OT Eval Moderate Complexity: [place], OTR/L,CBIS  [phone]   Tysean [place], Clarene Duke 08/07/2018, 10:20 AM

## 2018-08-07 NOTE — Progress Notes (Signed)
Late entry for this morning. Patient's daughter Regino Bellow at bedside requesting update from Dr. Wynetta Emery and to speak with social worker. Notified both MD and CSW of her request for updates, patient stated she was okay with daughter receiving updates. Donavan Foil, RN

## 2018-08-07 NOTE — NC FL2 (Addendum)
[place] LEVEL OF CARE SCREENING TOOL     IDENTIFICATION  Patient Name: [name] [name] Birthdate: August 28, 1935 Sex: female Admission Date (Current Location): 08/02/2018  [facility] and [place] Number:  [employer] and Address:  [place] [address], [place]      Provider Number: [id number]  Attending Physician Name and Address:  Murlean Iba, MD  Relative Name and Phone Number:       Current Level of Care: Hospital Recommended Level of Care: [place] Prior Approval Number:    Date Approved/Denied:   PASRR Number: [id number]  Discharge Plan: SNF    Current Diagnoses: Patient Active Problem List   Diagnosis Date Noted  . Pressure injury of skin 08/06/2018  . COPD with acute exacerbation ([place]) 08/04/2018  . COPD exacerbation ([place]) 08/03/2018  . MRSA bacteremia 08/03/2018  . Bilateral carotid artery stenosis 09/12/2014  . CKD (chronic kidney disease), stage III ([place]) 11/02/2013  . SMOKER 05/30/2009  . Bilateral carotid bruits 05/30/2009  . Type 2 diabetes mellitus with other specified complication ([place]) 16/07/930  . Hyperlipidemia associated with type 2 diabetes mellitus ([place]) 05/29/2009  . Morbid obesity ([place]) 05/29/2009  . Hypertension associated with diabetes ([place]) 05/29/2009  . Coronary atherosclerosis 05/29/2009  . PVD 05/29/2009  . Venous (peripheral) insufficiency 05/29/2009  . Edema 05/29/2009    Orientation RESPIRATION BLADDER Height & Weight     Self, Time, Situation, Place  O2(Nasal Cannula, 2L) Continent Weight: 108.9 kg Height:  5' (152.4 cm)  BEHAVIORAL SYMPTOMS/MOOD NEUROLOGICAL BOWEL NUTRITION STATUS  (none) (none) Continent Diet(Carb modified)  AMBULATORY STATUS COMMUNICATION OF NEEDS Skin   Extensive Assist Verbally Bruising(L Buttocks, pressure wound, deep tissue injury)                       Personal Care Assistance Level of Assistance  Bathing, Feeding,  Dressing Bathing Assistance: Limited assistance Feeding assistance: Independent Dressing Assistance: Maximum assistance     Functional Limitations Info  Sight, Hearing, Speech Sight Info: Adequate Hearing Info: Adequate Speech Info: Adequate    SPECIAL CARE FACTORS FREQUENCY  PT (By licensed PT)     PT Frequency: 5X/W              Contractures Contractures Info: Not present    Additional Factors Info  Code Status, Allergies Code Status Info: DNR Allergies Info: NKA           Current Medications (08/07/2018):  This is the current hospital active medication list Current Facility-Administered Medications  Medication Dose Route Frequency Provider Last Rate Last Dose  . 0.9 %  sodium chloride infusion  250 mL Intravenous PRN Oswald Hillock, MD      . atorvastatin (LIPITOR) tablet 80 mg  80 mg Oral q1800 Oswald Hillock, MD   80 mg at 08/06/18 1733  . budesonide (PULMICORT) nebulizer solution 0.5 mg  0.5 mg Nebulization BID Amin, Ankit Chirag, MD   0.5 mg at 08/07/18 0812  . ceFAZolin (ANCEF) IVPB 2g/100 mL premix  2 g Intravenous Q8H Johnson, Clanford L, MD      . cilostazol (PLETAL) tablet 100 mg  100 mg Oral BID Oswald Hillock, MD   100 mg at 08/07/18 3557  . clopidogrel (PLAVIX) tablet 75 mg  75 mg Oral Daily Oswald Hillock, MD   75 mg at 08/07/18 3220  . enoxaparin (LOVENOX) injection 40 mg  40 mg Subcutaneous Q24H [place], Gagan S,  MD   40 mg at 08/07/18 0505  . insulin aspart (novoLOG) injection 0-20 Units  0-20 Units Subcutaneous TID WC Johnson, Clanford L, MD   7 Units at 08/07/18 1216  . insulin aspart (novoLOG) injection 0-5 Units  0-5 Units Subcutaneous QHS Irwin Brakeman L, MD   3 Units at 08/06/18 2323  . insulin aspart (novoLOG) injection 6 Units  6 Units Subcutaneous TID WC Johnson, Clanford L, MD      . Derrill Memo ON 08/08/2018] insulin glargine (LANTUS) injection 15 Units  15 Units Subcutaneous Daily Johnson, Clanford L, MD      . ipratropium-albuterol (DUONEB) 0.5-2.5  (3) MG/3ML nebulizer solution 3 mL  3 mL Nebulization Q6H WA Amin, Ankit Chirag, MD   3 mL at 08/07/18 0812  . LORazepam (ATIVAN) tablet 0.5 mg  0.5 mg Oral QHS PRN Oswald Hillock, MD   0.5 mg at 08/05/18 2231  . losartan (COZAAR) tablet 100 mg  100 mg Oral Daily Oswald Hillock, MD   100 mg at 08/07/18 9532  . methylPREDNISolone sodium succinate (SOLU-MEDROL) 125 mg/2 mL injection 60 mg  60 mg Intravenous Q8H Johnson, Clanford L, MD   60 mg at 08/07/18 0504  . metoprolol succinate (TOPROL-XL) 24 hr tablet 75 mg  75 mg Oral BID Oswald Hillock, MD   75 mg at 08/07/18 0233  . nystatin (MYCOSTATIN/NYSTOP) topical powder   Topical TID Shelly Coss, MD      . sodium chloride flush (NS) 0.9 % injection 3 mL  3 mL Intravenous Q12H Oswald Hillock, MD   3 mL at 08/07/18 0929  . sodium chloride flush (NS) 0.9 % injection 3 mL  3 mL Intravenous PRN Oswald Hillock, MD         Discharge Medications: Please see discharge summary for a list of discharge medications.  Relevant Imaging Results:  Relevant Lab Results:   Additional [place], LCSW

## 2018-08-07 NOTE — Clinical Social Work Note (Signed)
Patient Information   Patient Name [name] [name], [name] [name] ([id number]) Sex Female DOB 07/21/1936  Room Bed  A334 A334-01  Patient Demographics   Address [place] [address] Phone [phone] (Home)  Patient Ethnicity & Race   Ethnic Group Patient Race  Not Hispanic or Latino White or Caucasian  Emergency Contact(s)   Name Relation Home Work [name] [name] Daughter [phone]   Documents on File    Status Date Received Description  Documents for the Patient  EMR Medication Summary Not Received    EMR Problem Summary Not Received    EMR Patient Summary Not Received    [name] Not Received    [name] E-Signature HIPAA Notice of Privacy Received 04/05/11   [name] E-Signature HIPAA Notice of Privacy Spanish Not Received    Driver's License Not Received    Insurance Card Not Received  mcd/wrfm  Advance Directives/Living Will/HCPOA/POA Not Received    Editor, commissioning Not Received    Insurance Card Not Received    VVS Policy for Pain - E Signature Not Received    Insurance Card Received 08/02/12 VVS  Insurance Card Not Received    AMB Correspondence Not Received  Referral [name], P  Advanced Beneficiary Notice (ABN) Not Received    Driver's License Received 32/44/01   Driver's License Received 02/72/53 WRFM/LS  Insurance Card Received 10/30/12 [name]  Release of Information Not Received    [place] E-Signature HIPAA Notice of Privacy Received 12/12/12   Insurance Card Received 03/01/13   Insurance Card Received 03/01/13 MCD/WRFM  E-Signature AOB Spanish Not Received    Insurance Card Received 08/26/14 mcaid  Insurance Card Received 09/05/14 medicare/medicaid/kf/vvs  Other Photo ID Not Received    AMB HH/NH/Hospice  66/44/03 CERTIFICATION/POC [facility]  AMB Correspondence  10/29/14 DECISION NOTES [facility]  AMB HH/NH/Hospice  11/23/14 INFORMATION SHEET [facility]  AMB Provider Completed Forms  09/15/14 CMN [facility]  AMB Provider Completed Forms  09/15/14 CMN [facility]  Insurance Card Received 10/01/15 MCR&MCD/CHMG/2018  Release of Information Received 01/06/16 [name]  HIM ROI Authorization  02/18/17 [facility] [place] Card Received 05/09/17 WRFM/MCR  Insurance Card Received 08/01/17 Medicare 2019  Release of Information Received 05/10/18   AMB Correspondence (Deleted) 11/08/14 DECISION NOTES [facility]  AMB HH/NH/Hospice (Deleted) 09/15/14 CMN [facility]  Documents for the Encounter  AOB (Assignment of Insurance Benefits) Not Received    E-signature AOB Signed 08/02/18   MEDICARE RIGHTS Not Received    E-signature Medicare Rights Signed 08/02/18   Medicare Observation  08/03/18   ED Patient Billing Extract   ED PB Billing Extract  Cardiac Monitoring Strip Received 08/03/18   Cardiac Monitoring Strip Shift Summary Received 08/03/18   EKG Received 08/03/18   Admission Information   Current Information   Attending Provider Admitting Provider Admission Type Admission Status  Murlean Iba, MD Oswald Hillock, MD Emergency Admission (Confirmed)       Admission Date/Time Discharge Date Hospital Service Auth/Cert Status  47/42/59 07:15 PM  [place] Unit Room/Bed   [facility] AP-DEPT 300 D638/V564-33        Admission   Complaint  ---  Hospital Account   Name Acct ID Class Status Primary Coverage  [name] [name] [id number] Inpatient [place]  Account (for Hospital Account [id number])   Name Relation to Pt Service Area Active? Acct Type  [name] [name] Self CHSA Yes Personal/Family  Address Phone    [place], [place] [address] [phone])        Coverage Information (for Hospital Account [id number])   1. [facility] MEDICARE/UHC MEDICARE   F/O Payor/Plan Precert #   [name]  [name] [name] [id number]  [place]  [address] [place] [address] [phone]  2. MEDICAID [place]/MEDICAID OF [place]   F/O Payor/Plan Precert #  MEDICAID [place]/MEDICAID OF [place]   Subscriber Subscriber #  [name] [name] [id number] N  Address Phone  [address] [place], [place] [address]

## 2018-08-07 NOTE — Progress Notes (Signed)
PICC line ordered for today, patient to have midline placed if they are unable to place PICC line due to Stage III kidney disease. Notified IV team of order. IV team reception stated she will have the nurse call back. Donavan Foil, RN

## 2018-08-07 NOTE — Progress Notes (Signed)
Physical Therapy Treatment Patient Details Name: [name] [name] MRN: [id number] DOB: 05/19/1936 Today's Date: 08/07/2018    History of Present Illness [name] [name]  is a 83 y.o. female, with history of hypertension, hyperlipidemia, CAD, peripheral vascular disease came to hospital with generalized weakness.  Patient says that earlier she fell at home.  She denies passing out but says that she was so weak that she just went down to the floor.  Denies any head injury.  EMS was called and patient was noted to have hypoxemia.    PT Comments    Patient demonstrates increased endurance/distance for gait training while followed with wheelchair for safety and rest breaks.  Patient desaturated to 88% while on room air after walking to bathroom, put on 2 LPM O2 for gait training and able to keep O2 saturation between 94-96% during ambulation, required a 3-4 minute sitting rest beak before ambulating back to room with slow labored cadence.  Patient continued sitting up in chair with family members present after therapy.  Patient will benefit from continued physical therapy in hospital and recommended venue below to increase strength, balance, endurance for safe ADLs and gait.    Follow Up Recommendations  SNF;Supervision for mobility/OOB;Supervision - Intermittent     Equipment Recommendations  Rolling walker with 5" wheels;3in1 (PT)(need large BSC)    Recommendations for Other Services       Precautions / Restrictions Precautions Precautions: Fall Restrictions Weight Bearing Restrictions: No    Mobility  Bed Mobility               General bed mobility comments: Patient presents up in chair, assisted by OT  Transfers Overall transfer level: Needs assistance Equipment used: None;Rolling walker (2 wheeled) Transfers: Sit to/from [facility] Sit to Stand: Min guard Stand pivot transfers: Min guard       General transfer comment: uses armrest of chair and BSC for sit to  stands/transfers when not using RW  Ambulation/Gait Ambulation/Gait assistance: Herbalist (Feet): 35 Feet Assistive device: Rolling walker (2 wheeled) Gait Pattern/deviations: Decreased step length - right;Decreased step length - left;Decreased stride length Gait velocity: decreased    General Gait Details: slow labored cadence without loss of balance, followed with w/c for safety, desaturated to 88% while on room air when walking to bathroom, on 2 LPM for gait training with O2 saturation remaining between 94-96%   Stairs             Wheelchair Mobility    Modified Rankin (Stroke Patients Only)       Balance Overall balance assessment: Needs assistance Sitting-balance support: Feet supported;No upper extremity supported Sitting balance-Leahy Scale: Good     Standing balance support: No upper extremity supported;During functional activity Standing balance-Leahy Scale: Fair Standing balance comment: fair/poor without AD, fair/good using RW                            Cognition Arousal/Alertness: Awake/alert Behavior During Therapy: WFL for tasks assessed/performed Overall Cognitive Status: Within Functional Limits for tasks assessed                                        Exercises      General Comments        Pertinent Vitals/Pain Pain Assessment: No/denies pain    Home Living  Prior Function            PT Goals (current goals can now be found in the care plan section) Acute Rehab PT Goals Patient Stated Goal: return home PT Goal Formulation: With patient/family Time For Goal Achievement: 08/18/18 Potential to Achieve Goals: Good Progress towards PT goals: Progressing toward goals    Frequency    Min 3X/week      PT Plan Current plan remains appropriate    Co-evaluation              AM-PAC PT "6 Clicks" Mobility   Outcome Measure  Help needed turning from your  back to your side while in a flat bed without using bedrails?: A Little Help needed moving from lying on your back to sitting on the side of a flat bed without using bedrails?: Total Help needed moving to and from a bed to a chair (including a wheelchair)?: Total Help needed standing up from a chair using your arms (e.g., wheelchair or bedside chair)?: A Little Help needed to walk in hospital room?: A Little Help needed climbing 3-5 steps with a railing? : A Little 6 Click Score: 14    End of Session Equipment Utilized During Treatment: Gait belt;Oxygen Activity Tolerance: Patient tolerated treatment well;Patient limited by fatigue Patient left: in chair;with call bell/phone within reach;with family/visitor present Nurse Communication: Mobility status PT Visit Diagnosis: Unsteadiness on feet (R26.81);Other abnormalities of gait and mobility (R26.89);Muscle weakness (generalized) (M62.81)     Time: 1120-1150 PT Time Calculation (min) (ACUTE ONLY): 30 min  Charges:  $Gait Training: 8-22 mins $Therapeutic Activity: 8-22 mins                     2:18 PM, 08/07/18 Lonell Grandchild, MPT Physical Therapist with [facility] [phone] office [phone] mobile phone

## 2018-08-08 LAB — BASIC METABOLIC PANEL
Anion gap: 7 (ref 5–15)
BUN: 48 mg/dL — ABNORMAL HIGH (ref 8–23)
CO2: 28 mmol/L (ref 22–32)
CREATININE: 1.56 mg/dL — AB (ref 0.44–1.00)
Calcium: 7.9 mg/dL — ABNORMAL LOW (ref 8.9–10.3)
Chloride: 103 mmol/L (ref 98–111)
GFR calc Af Amer: 36 mL/min — ABNORMAL LOW (ref >60)
GFR calc non Af Amer: 31 mL/min — ABNORMAL LOW (ref >60)
Glucose, Bld: 270 mg/dL — ABNORMAL HIGH (ref 70–99)
Potassium: 4.8 mmol/L (ref 3.5–5.1)
Sodium: 138 mmol/L (ref 135–145)

## 2018-08-08 LAB — GLUCOSE, CAPILLARY
Glucose-Capillary: 245 mg/dL — ABNORMAL HIGH (ref 70–99)
Glucose-Capillary: 284 mg/dL — ABNORMAL HIGH (ref 70–99)
Glucose-Capillary: 228 mg/dL — ABNORMAL HIGH (ref 70–99)
Glucose-Capillary: 248 mg/dL — ABNORMAL HIGH (ref 70–99)

## 2018-08-08 LAB — MAGNESIUM: MAGNESIUM: 2.2 mg/dL (ref 1.7–2.4)

## 2018-08-08 LAB — CBC
HCT: 29.8 % — ABNORMAL LOW (ref 36.0–46.0)
Hemoglobin: 8.8 g/dL — ABNORMAL LOW (ref 12.0–15.0)
MCH: 29.9 pg (ref 26.0–34.0)
MCHC: 29.5 g/dL — ABNORMAL LOW (ref 30.0–36.0)
MCV: 101.4 fL — ABNORMAL HIGH (ref 80.0–100.0)
Platelets: 127 10*3/uL — ABNORMAL LOW (ref 150–400)
RBC: 2.94 MIL/uL — ABNORMAL LOW (ref 3.87–5.11)
RDW: 15.1 % (ref 11.5–15.5)
WBC: 13 10*3/uL — ABNORMAL HIGH (ref 4.0–10.5)
nRBC: 0.2 % (ref 0.0–0.2)

## 2018-08-08 MED ORDER — INSULIN GLARGINE 100 UNIT/ML ~~LOC~~ SOLN
20.0000 [IU] | Freq: Every day | SUBCUTANEOUS | Status: DC
  Filled 2018-08-08 (×3): qty 0.2

## 2018-08-08 MED ORDER — INSULIN ASPART 100 UNIT/ML ~~LOC~~ SOLN
10.0000 [IU] | Freq: Three times a day (TID) | SUBCUTANEOUS | Status: DC
  Administered 2018-08-09 – 2018-08-10 (×5): 10 [IU] via SUBCUTANEOUS

## 2018-08-08 MED ORDER — INSULIN GLARGINE 100 UNIT/ML ~~LOC~~ SOLN
30.0000 [IU] | Freq: Every day | SUBCUTANEOUS | Status: DC
  Administered 2018-08-09 – 2018-08-10 (×2): 30 [IU] via SUBCUTANEOUS
  Filled 2018-08-08 (×3): qty 0.3

## 2018-08-08 MED ORDER — METHYLPREDNISOLONE SODIUM SUCC 40 MG IJ SOLR
40.0000 mg | Freq: Three times a day (TID) | INTRAMUSCULAR | Status: DC
  Administered 2018-08-08 – 2018-08-10 (×6): 40 mg via INTRAVENOUS
  Filled 2018-08-08 (×6): qty 1

## 2018-08-08 MED ORDER — SALINE SPRAY 0.65 % NA SOLN
1.0000 | NASAL | Status: DC | PRN
  Filled 2018-08-08: qty 44

## 2018-08-08 NOTE — Care Management Note (Signed)
Case Management Note  Patient Details  Name: [name] [name] MRN: [id number] Date of Birth: May 19, 1936  If discussed at Long Length of Stay Meetings, dates discussed:  08/08/2018  Additional Comments:  Sherald Barge, RN 08/08/2018, 1:06 PM

## 2018-08-08 NOTE — Progress Notes (Signed)
PROGRESS NOTE  [name] [name]  OZH:[id number] DOB: 1936-05-01 DOA: 08/02/2018 PCP: Dettinger, Fransisca Kaufmann, MD   Brief Narrative:  83 year old female with history of coronary artery disease, essential hypertension, hyperlipidemia, peripheral vascular disease came to the hospital with complaints of generalized weakness.  Patient reports she experienced fall at home and was unable to get help for some time.  When EMS arrived she was noted to be hypoxic.  She was admitted to the hospital for COPD exacerbation and met Sirs criteria.  Her blood cultures ended up growing gram-positive cocci-MSSA.  Trans-thoracic echocardiogram was negative.  Assessment & Plan:   Principal Problem:   MRSA bacteremia Active Problems:   Type 2 diabetes mellitus with other specified complication ([place])   Morbid obesity ([place])   SMOKER   Hypertension associated with diabetes ([place])   Coronary atherosclerosis   PVD   Venous (peripheral) insufficiency   CKD (chronic kidney disease), stage III (H[facility])   COPD exacerbation ([place])   COPD with acute exacerbation ([facility])   Pressure injury of skin  Gram-positive cocci bacteremia, MSSA -Repeat blood cultures 1/10-no growth to date. -Trans-thoracic Echocardiogram-negative for any evidence of endocarditis - Appreciate input from infectious disease team - per ID TEE not needed at this time.  Recommended to empirically treat with antibiotics for 4 weeks. Per ID Dr Megan Salon: End date 08/29/2018  Stage 3 CKD - following renal function panel.  acute COPD exacerbation - Continue Solu-Medrol 40 mg every 8 hours.  Bronchodilators scheduled and as needed - Continue Pulmicort twice daily - Ordered incentive spirometry and flutter valve - Supplemental oxygen as needed. - Flu swab is negative  Diabetes mellitus type 2 with hyperglycemia -Hemoglobin A1c 6.3.  Hold off on oral medication.  Continue insulin sliding scale and Accu-Chek, increase Lantus to 20 units daily. Titrated prandial coverage.     CBG (last 3)  Recent Labs    08/07/18 1706 08/07/18 2217 08/08/18 0739  GLUCAP 229* 203* 245*   Peripheral chill disease -Continue cilostazol  Coronary artery disease -Currently patient is chest pain-free.  Continue home medication Plavix, statin, Toprol-XL.  Continue losartan 100 mg daily  DVT prophylaxis: Lovenox Code Status: DNR Family Communication: Daughter at bedside Disposition Plan: SNF when bed available.  Pt is medically ready to go to SNF 1/14   Consultants:   Infectious diseases Megan Salon)  Procedures:   None  Antimicrobials:   Ancef thru 08/29/18  Subjective: Patient says that her SOB is much better and she is overall feeling better and ready for SNF rehab.   Review of Systems Otherwise negative except as per HPI, including: General = no fevers, chills, dizziness, malaise, fatigue HEENT/EYES = negative for pain, redness, loss of vision, double vision, blurred vision, loss of hearing, sore throat, hoarseness, dysphagia Cardiovascular= negative for chest pain, palpitation, murmurs, lower extremity swelling Respiratory/lungs= negative for shortness of breath, cough, hemoptysis, wheezing, mucus production Gastrointestinal= negative for nausea, vomiting,, abdominal pain, melena, hematemesis Genitourinary= negative for Dysuria, Hematuria, Change in Urinary Frequency MSK = Negative for arthralgia, myalgias, Back Pain, Joint swelling  Neurology= Negative for headache, seizures, numbness, tingling  Psychiatry= Negative for anxiety, depression, suicidal and homocidal ideation Allergy/Immunology= Medication/Food allergy as listed  Skin= Negative for Rash, lesions, ulcers, itching  Objective: Vitals:   08/07/18 2059 08/07/18 2216 08/08/18 0533 08/08/18 0822  BP:  (!) 160/48 (!) 166/62   Pulse:  71 73   Resp:  18 18   Temp:  98.3 F (36.8 C) 98.2 F (36.8 C)  TempSrc:  Oral Oral   SpO2: 97% 97% 98% 97%  Weight:      Height:        Intake/Output  Summary (Last 24 hours) at 08/08/2018 1113 Last data filed at 08/08/2018 0400 Gross per 24 hour  Intake 680 ml  Output 1350 ml  Net -670 ml   Filed Weights   08/02/18 1917  Weight: 108.9 kg   Examination:  Constitutional: NAD, calm, comfortable Eyes: PERRL, lids and conjunctivae normal ENMT: Mucous membranes are moist. Posterior pharynx clear of any exudate or lesions.Normal dentition.  Neck: normal, supple, no masses, no thyromegaly Respiratory: BBS mostly clear with good air movement,  No wheeze heard.  Cardiovascular: normal s1,s2 sounds, no murmurs / rubs / gallops. No extremity edema. 2+ pedal pulses. No carotid bruits.  Abdomen: no tenderness, no masses palpated. No hepatosplenomegaly. Bowel sounds positive.  Musculoskeletal: no clubbing / cyanosis. No joint deformity upper and lower extremities. Good ROM, no contractures. Normal muscle tone.  Skin: no rashes, lesions, ulcers. No induration Neurologic: CN 2-12 grossly intact. Sensation intact, DTR normal. Strength 4/5 in all 4.  Psychiatric: Normal judgment and insight. Alert and oriented x 3. Normal mood.   Data Reviewed:   CBC: Recent Labs  Lab 08/02/18 1918  08/04/18 0532 08/05/18 9371 08/06/18 0708 08/07/18 0532 08/08/18 0604  WBC 13.5*   < > 17.1* 15.9* 11.5* 11.2* 13.0*  NEUTROABS 12.4*  --  15.9*  --   --   --   --   HGB 9.3*   < > 8.0* 7.8* 7.4* 8.4* 8.8*  HCT 31.6*   < > 27.1* 26.5* 25.9* 28.2* 29.8*  MCV 101.3*   < > 101.5* 100.4* 102.4* 101.4* 101.4*  PLT 132*   < > 88* 93* 95* 104* 127*   < > = values in this interval not displayed.   Basic Metabolic Panel: Recent Labs  Lab 08/04/18 0532 08/05/18 0821 08/06/18 0708 08/07/18 0532 08/08/18 0604  NA 135 136 139 137 138  K 3.9 4.2 4.2 4.5 4.8  CL 104 105 109 105 103  CO2 '24 23 24 24 28  '$ GLUCOSE 232* 239* 226* 242* 270*  BUN 27* 32* 40* 42* 48*  CREATININE 1.37* 1.57* 1.45* 1.48* 1.56*  CALCIUM 8.0* 7.9* 7.8* 7.9* 7.9*  MG  --  2.3 2.3 2.4 2.2    GFR: Estimated Creatinine Clearance: 32.2 mL/min (A) (by C-G formula based on SCr of 1.56 mg/dL (H)). Liver Function Tests: Recent Labs  Lab 08/02/18 1918 08/03/18 0256  AST 24 66*  ALT 14 18  ALKPHOS 111 89  BILITOT 0.8 1.0  PROT 6.8 6.3*  ALBUMIN 2.7* 2.5*   No results for input(s): LIPASE, AMYLASE in the last 168 hours. No results for input(s): AMMONIA in the last 168 hours. Coagulation Profile: No results for input(s): INR, PROTIME in the last 168 hours. Cardiac Enzymes: No results for input(s): CKTOTAL, CKMB, CKMBINDEX, TROPONINI in the last 168 hours. BNP (last 3 results) No results for input(s): PROBNP in the last 8760 hours. HbA1C: No results for input(s): HGBA1C in the last 72 hours. CBG: Recent Labs  Lab 08/07/18 0743 08/07/18 1123 08/07/18 1706 08/07/18 2217 08/08/18 0739  GLUCAP 238* 248* 229* 203* 245*   Lipid Profile: No results for input(s): CHOL, HDL, LDLCALC, TRIG, CHOLHDL, LDLDIRECT in the last 72 hours. Thyroid Function Tests: No results for input(s): TSH, T4TOTAL, FREET4, T3FREE, THYROIDAB in the last 72 hours. Anemia Panel: No results for input(s): VITAMINB12, FOLATE, FERRITIN,  TIBC, IRON, RETICCTPCT in the last 72 hours. Sepsis Labs: Recent Labs  Lab 08/02/18 1929 08/03/18 0256  LATICACIDVEN 2.12* 0.9    Recent Results (from the past 240 hour(s))  Blood Culture (routine x 2)     Status: Abnormal   Collection Time: 08/02/18  7:18 PM  Result Value Ref Range Status   Specimen Description   Final    BLOOD RIGHT ANTECUBITAL Performed at [facility], [address] [place], [place] [address]    Special Requests   Final    BOTTLES DRAWN AEROBIC AND ANAEROBIC Blood Culture adequate volume Performed at [facility], [address], [place], [place] [address]    Culture  Setup Time   Final    GRAM POSITIVE COCCI AEROBIC AND ANAEROBIC Gram Stain Report Called to,Read Back By and Verified With: COX D. AT 0910A ON [id number] BY THOMPSON  S. CRITICAL RESULT CALLED TO, READ BACK BY AND VERIFIED WITH: HURTH PHARMD AT 1440 ON [id number] BY SJW Performed at [facility], [place] [address], [place], [place] [address]    Culture STAPHYLOCOCCUS AUREUS (A)  Final   Report Status 08/05/2018 FINAL  Final   Organism ID, Bacteria STAPHYLOCOCCUS AUREUS  Final      Susceptibility   Staphylococcus aureus - MIC*    CIPROFLOXACIN <=0.5 SENSITIVE Sensitive     ERYTHROMYCIN <=0.25 SENSITIVE Sensitive     GENTAMICIN <=0.5 SENSITIVE Sensitive     OXACILLIN <=0.25 SENSITIVE Sensitive     TETRACYCLINE <=1 SENSITIVE Sensitive     VANCOMYCIN 1 SENSITIVE Sensitive     TRIMETH/SULFA <=10 SENSITIVE Sensitive     CLINDAMYCIN <=0.25 SENSITIVE Sensitive     RIFAMPIN <=0.5 SENSITIVE Sensitive     Inducible Clindamycin NEGATIVE Sensitive     * STAPHYLOCOCCUS AUREUS  Blood Culture ID Panel (Reflexed)     Status: Abnormal   Collection Time: 08/02/18  7:18 PM  Result Value Ref Range Status   Enterococcus species NOT DETECTED NOT DETECTED Final   Listeria monocytogenes NOT DETECTED NOT DETECTED Final   Staphylococcus species DETECTED (A) NOT DETECTED Final    Comment: CRITICAL RESULT CALLED TO, READ BACK BY AND VERIFIED WITH: HURTH PHARMD AT 1440 ON [id number] BY SJW    Staphylococcus aureus (BCID) DETECTED (A) NOT DETECTED Final    Comment: Methicillin (oxacillin) susceptible Staphylococcus aureus (MSSA). Preferred therapy is anti staphylococcal beta lactam antibiotic (Cefazolin or Nafcillin), unless clinically contraindicated. CRITICAL RESULT CALLED TO, READ BACK BY AND VERIFIED WITH: HURTH PHARMD AT 1440 ON [id number] BY SJW    Methicillin resistance NOT DETECTED NOT DETECTED Final   Streptococcus species NOT DETECTED NOT DETECTED Final   Streptococcus agalactiae NOT DETECTED NOT DETECTED Final   Streptococcus pneumoniae NOT DETECTED NOT DETECTED Final   Streptococcus pyogenes NOT DETECTED NOT DETECTED Final   Acinetobacter baumannii NOT DETECTED NOT  DETECTED Final   Enterobacteriaceae species NOT DETECTED NOT DETECTED Final   Enterobacter cloacae complex NOT DETECTED NOT DETECTED Final   Escherichia coli NOT DETECTED NOT DETECTED Final   Klebsiella oxytoca NOT DETECTED NOT DETECTED Final   Klebsiella pneumoniae NOT DETECTED NOT DETECTED Final   Proteus species NOT DETECTED NOT DETECTED Final   Serratia marcescens NOT DETECTED NOT DETECTED Final   Haemophilus influenzae NOT DETECTED NOT DETECTED Final   Neisseria meningitidis NOT DETECTED NOT DETECTED Final   Pseudomonas aeruginosa NOT DETECTED NOT DETECTED Final   Candida albicans NOT DETECTED NOT DETECTED Final   Candida glabrata NOT DETECTED NOT DETECTED Final  Candida krusei NOT DETECTED NOT DETECTED Final   Candida parapsilosis NOT DETECTED NOT DETECTED Final   Candida tropicalis NOT DETECTED NOT DETECTED Final    Comment: Performed at [facility], [place] [address] [place], [place] [address]  Blood Culture (routine x 2)     Status: Abnormal   Collection Time: 08/02/18  8:13 PM  Result Value Ref Range Status   Specimen Description   Final    BLOOD LEFT HAND Performed at [facility], [address], [place], [place] [address]    Special Requests   Final    AEROBIC BOTTLE ONLY Blood Culture adequate volume Performed at [facility], [address] [place], [place] [address]    Culture  Setup Time   Final    GRAM POSITIVE COCCI AEROBIC BOTTLE ONLY Gram Stain Report Called to,Read Back By and Verified With: COX D. AT 0910A ON [id number] BY THOMPSON S. CRITICAL VALUE NOTED.  VALUE IS CONSISTENT WITH PREVIOUSLY REPORTED AND CALLED VALUE.    Culture (A)  Final    STAPHYLOCOCCUS AUREUS SUSCEPTIBILITIES PERFORMED ON PREVIOUS CULTURE WITHIN THE LAST 5 DAYS. Performed at [facility], [place] [address] [place], [place] [address]    Report Status 08/05/2018 FINAL  Final  Culture, blood (routine x 2)     Status: None (Preliminary result)   Collection Time: 08/04/18  5:32  AM  Result Value Ref Range Status   Specimen Description BLOOD RIGHT ARM  Final   Special Requests   Final    BOTTLES DRAWN AEROBIC ONLY Blood Culture results may not be optimal due to an excessive volume of blood received in culture bottles   Culture   Final    NO GROWTH 4 DAYS Performed at [facility], [address], [place], [place] [address]    Report Status PENDING  Incomplete  Culture, blood (routine x 2)     Status: None (Preliminary result)   Collection Time: 08/04/18  6:43 AM  Result Value Ref Range Status   Specimen Description BLOOD LEFT ARM  Final   Special Requests   Final    BOTTLES DRAWN AEROBIC ONLY Blood Culture results may not be optimal due to an inadequate volume of blood received in culture bottles   Culture   Final    NO GROWTH 4 DAYS Performed at [facility], [address] [place],  [address]    Report Status PENDING  Incomplete    Radiology Studies: Dg Chest Port 1 View  Result Date: 08/07/2018 CLINICAL DATA:  PICC line placement. EXAM: PORTABLE CHEST 1 VIEW COMPARISON:  08/02/2018 FINDINGS: Right-sided PICC line terminates at the expected location of the cavoatrial junction. No evidence of pneumothorax. Mildly enlarged cardiac silhouette with calcific atherosclerotic disease and tortuosity of the aorta. Mild interstitial pulmonary edema. Osseous structures are without acute abnormality. Soft tissues are grossly normal. IMPRESSION: No evidence of pneumothorax post right PICC line placement. Interstitial pulmonary edema. Enlarged cardiac silhouette. Electronically Signed   By: Fidela Salisbury M.D.   On: 08/07/2018 15:54    Scheduled Meds: . atorvastatin  80 mg Oral q1800  . budesonide (PULMICORT) nebulizer solution  0.5 mg Nebulization BID  . cilostazol  100 mg Oral BID  . clopidogrel  75 mg Oral Daily  . insulin aspart  0-20 Units Subcutaneous TID WC  . insulin aspart  0-5 Units Subcutaneous QHS  . insulin aspart  8 Units Subcutaneous TID WC    . insulin glargine  20 Units Subcutaneous  Daily  . ipratropium-albuterol  3 mL Nebulization Q6H WA  . losartan  100 mg Oral Daily  . methylPREDNISolone sodium succinate  60 mg Intravenous Q8H  . metoprolol succinate  75 mg Oral BID  . nystatin   Topical TID  . sodium chloride flush  3 mL Intravenous Q12H   Continuous Infusions: . sodium chloride    .  ceFAZolin (ANCEF) IV 2 g (08/08/18 0526)     LOS: 4 days   Time spent= 24 mins  Kambri Dismore, MD [facility]  If 7PM-7AM, please contact night-coverage [email or web address] 08/08/2018, 11:13 AM

## 2018-08-08 NOTE — Progress Notes (Signed)
08/08/2018 8:40 AM  Pt says that she would like to change her code status to full code.  She has reflected and discussed with her daughters and this is her new wish.    Murvin Natal MD

## 2018-08-09 ENCOUNTER — Other Ambulatory Visit: Payer: Self-pay | Admitting: Family Medicine

## 2018-08-09 DIAGNOSIS — E1122 Type 2 diabetes mellitus with diabetic chronic kidney disease: Secondary | ICD-10-CM

## 2018-08-09 DIAGNOSIS — N183 Chronic kidney disease, stage 3 (moderate): Principal | ICD-10-CM

## 2018-08-09 LAB — GLUCOSE, CAPILLARY
Glucose-Capillary: 239 mg/dL — ABNORMAL HIGH (ref 70–99)
Glucose-Capillary: 198 mg/dL — ABNORMAL HIGH (ref 70–99)
Glucose-Capillary: 218 mg/dL — ABNORMAL HIGH (ref 70–99)
Glucose-Capillary: 106 mg/dL — ABNORMAL HIGH (ref 70–99)

## 2018-08-09 LAB — CULTURE, BLOOD (ROUTINE X 2)
CULTURE: NO GROWTH
Culture: NO GROWTH

## 2018-08-09 LAB — BASIC METABOLIC PANEL
Anion gap: 7 (ref 5–15)
BUN: 52 mg/dL — ABNORMAL HIGH (ref 8–23)
CO2: 27 mmol/L (ref 22–32)
Calcium: 7.7 mg/dL — ABNORMAL LOW (ref 8.9–10.3)
Chloride: 102 mmol/L (ref 98–111)
Creatinine, Ser: 1.6 mg/dL — ABNORMAL HIGH (ref 0.44–1.00)
GFR calc Af Amer: 35 mL/min — ABNORMAL LOW (ref >60)
GFR calc non Af Amer: 30 mL/min — ABNORMAL LOW (ref >60)
Glucose, Bld: 231 mg/dL — ABNORMAL HIGH (ref 70–99)
POTASSIUM: 4.8 mmol/L (ref 3.5–5.1)
Sodium: 136 mmol/L (ref 135–145)

## 2018-08-09 LAB — CBC
HCT: 28.4 % — ABNORMAL LOW (ref 36.0–46.0)
Hemoglobin: 8.5 g/dL — ABNORMAL LOW (ref 12.0–15.0)
MCH: 30.1 pg (ref 26.0–34.0)
MCHC: 29.9 g/dL — ABNORMAL LOW (ref 30.0–36.0)
MCV: 100.7 fL — ABNORMAL HIGH (ref 80.0–100.0)
Platelets: 141 10*3/uL — ABNORMAL LOW (ref 150–400)
RBC: 2.82 MIL/uL — ABNORMAL LOW (ref 3.87–5.11)
RDW: 15.2 % (ref 11.5–15.5)
WBC: 15.4 10*3/uL — ABNORMAL HIGH (ref 4.0–10.5)
nRBC: 0.1 % (ref 0.0–0.2)

## 2018-08-09 LAB — MAGNESIUM: MAGNESIUM: 2.3 mg/dL (ref 1.7–2.4)

## 2018-08-09 MED ORDER — IPRATROPIUM-ALBUTEROL 0.5-2.5 (3) MG/3ML IN SOLN
3.0000 mL | Freq: Three times a day (TID) | RESPIRATORY_TRACT | Status: DC
  Administered 2018-08-10 (×2): 3 mL via RESPIRATORY_TRACT
  Filled 2018-08-09 (×2): qty 3

## 2018-08-09 MED ORDER — ALBUTEROL SULFATE (2.5 MG/3ML) 0.083% IN NEBU: 2.5000 mg | INHALATION_SOLUTION | Freq: Four times a day (QID) | RESPIRATORY_TRACT | Status: DC | PRN

## 2018-08-09 NOTE — Progress Notes (Signed)
PROGRESS NOTE  [name] [name]  ION:[id number] DOB: 21-Apr-1936 DOA: 08/02/2018 PCP: Dettinger, Fransisca Kaufmann, MD   Brief Narrative:  83 year old female with history of coronary artery disease, essential hypertension, hyperlipidemia, peripheral vascular disease came to the hospital with complaints of generalized weakness.  Patient reports she experienced fall at home and was unable to get help for some time.  When EMS arrived she was noted to be hypoxic.  She was admitted to the hospital for COPD exacerbation and met Sirs criteria.  Her blood cultures ended up growing gram-positive cocci-MSSA.  Trans-thoracic echocardiogram was negative.  Assessment & Plan:   Principal Problem:   MRSA bacteremia Active Problems:   Type 2 diabetes mellitus with other specified complication ([place])   Morbid obesity ([place])   SMOKER   Hypertension associated with diabetes ([place])   Coronary atherosclerosis   PVD   Venous (peripheral) insufficiency   CKD (chronic kidney disease), stage III (HCC)   COPD exacerbation ([place])   COPD with acute exacerbation (HCC)   Pressure injury of skin  Gram-positive cocci bacteremia, MSSA -Repeat blood cultures 1/10-no growth to date. -Trans-thoracic Echocardiogram-negative for any evidence of endocarditis - Appreciate input from infectious disease team - per ID TEE not needed at this time.  Recommended to empirically treat with antibiotics for 4 weeks. Per ID Dr Megan Salon: End date 08/29/2018  Stage 3 CKD - following renal function panel.  acute COPD exacerbation - Continue Solu-Medrol 40 mg every 8 hours.  Bronchodilators scheduled and as needed - Continue Pulmicort twice daily - Ordered incentive spirometry and flutter valve - Supplemental oxygen as needed. - Flu swab is negative  Diabetes mellitus type 2 with hyperglycemia -Hemoglobin A1c 6.3.  Hold off on oral medication.  Continue insulin sliding scale and Accu-Chek, increase Lantus to 20 units daily. Titrated prandial coverage.     CBG (last 3)  Recent Labs    08/08/18 2155 08/09/18 0720 08/09/18 1140  GLUCAP 248* 239* 198*   Peripheral chill disease -Continue cilostazol  Coronary artery disease -Currently patient is chest pain-free.  Continue home medication Plavix, statin, Toprol-XL.  Continue losartan 100 mg daily  DVT prophylaxis: Lovenox Code Status: DNR Family Communication: Daughter at bedside Disposition Plan: SNF 08/10/18   Consultants:   Infectious diseases Megan Salon)  Procedures:   None  Antimicrobials:   Ancef thru 08/29/18  Subjective: Patient says that her SOB is much better and she is overall feeling better and ready for SNF rehab. No complaints. Pain controlled.   Review of Systems Otherwise negative except as per HPI, including: General = no fevers, chills, dizziness, malaise, fatigue HEENT/EYES = negative for pain, redness, loss of vision, double vision, blurred vision, loss of hearing, sore throat, hoarseness, dysphagia Cardiovascular= negative for chest pain, palpitation, murmurs, lower extremity swelling Respiratory/lungs= negative for shortness of breath, cough, hemoptysis, wheezing, mucus production Gastrointestinal= negative for nausea, vomiting,, abdominal pain, melena, hematemesis Genitourinary= negative for Dysuria, Hematuria, Change in Urinary Frequency MSK = Negative for arthralgia, myalgias, Back Pain, Joint swelling  Neurology= Negative for headache, seizures, numbness, tingling  Psychiatry= Negative for anxiety, depression, suicidal and homocidal ideation Allergy/Immunology= Medication/Food allergy as listed  Skin= Negative for Rash, lesions, ulcers, itching  Objective: Vitals:   08/09/18 0540 08/09/18 0801 08/09/18 1440 08/09/18 1445  BP: (!) 148/53   (!) 130/43  Pulse: 66   70  Resp: 18   18  Temp: 97.9 F (36.6 C)   98.2 F (36.8 C)  TempSrc: Oral   Oral  SpO2: 98%  97% 96% 94%  Weight:      Height:        Intake/Output Summary (Last 24 hours) at  08/09/2018 1704 Last data filed at 08/09/2018 1500 Gross per 24 hour  Intake 603 ml  Output 1900 ml  Net -1297 ml   Filed Weights   08/02/18 1917  Weight: 108.9 kg   Examination:  Constitutional: NAD, calm, comfortable Eyes: PERRL, lids and conjunctivae normal ENMT: Mucous membranes are moist. Posterior pharynx clear of any exudate or lesions.Normal dentition.  Neck: normal, supple, no masses, no thyromegaly Respiratory: BBS mostly clear with good air movement,  No wheeze heard.  Cardiovascular: normal s1,s2 sounds, no murmurs / rubs / gallops. No extremity edema. 2+ pedal pulses. No carotid bruits.  Abdomen: no tenderness, no masses palpated. No hepatosplenomegaly. Bowel sounds positive.  Musculoskeletal: no clubbing / cyanosis. No joint deformity upper and lower extremities. Good ROM, no contractures. Normal muscle tone.  Skin: no rashes, lesions, ulcers. No induration Neurologic: CN 2-12 grossly intact. Sensation intact, DTR normal. Strength 4/5 in all 4.  Psychiatric: Normal judgment and insight. Alert and oriented x 3. Normal mood.   Data Reviewed:   CBC: Recent Labs  Lab 08/02/18 1918  08/04/18 0532 08/05/18 6761 08/06/18 0708 08/07/18 0532 08/08/18 0604 08/09/18 0556  WBC 13.5*   < > 17.1* 15.9* 11.5* 11.2* 13.0* 15.4*  NEUTROABS 12.4*  --  15.9*  --   --   --   --   --   HGB 9.3*   < > 8.0* 7.8* 7.4* 8.4* 8.8* 8.5*  HCT 31.6*   < > 27.1* 26.5* 25.9* 28.2* 29.8* 28.4*  MCV 101.3*   < > 101.5* 100.4* 102.4* 101.4* 101.4* 100.7*  PLT 132*   < > 88* 93* 95* 104* 127* 141*   < > = values in this interval not displayed.   Basic Metabolic Panel: Recent Labs  Lab 08/05/18 0821 08/06/18 0708 08/07/18 0532 08/08/18 0604 08/09/18 0556  NA 136 139 137 138 136  K 4.2 4.2 4.5 4.8 4.8  CL 105 109 105 103 102  CO2 '23 24 24 28 27  '$ GLUCOSE 239* 226* 242* 270* 231*  BUN 32* 40* 42* 48* 52*  CREATININE 1.57* 1.45* 1.48* 1.56* 1.60*  CALCIUM 7.9* 7.8* 7.9* 7.9* 7.7*  MG  2.3 2.3 2.4 2.2 2.3   GFR: Estimated Creatinine Clearance: 31.4 mL/min (A) (by C-G formula based on SCr of 1.6 mg/dL (H)). Liver Function Tests: Recent Labs  Lab 08/02/18 1918 08/03/18 0256  AST 24 66*  ALT 14 18  ALKPHOS 111 89  BILITOT 0.8 1.0  PROT 6.8 6.3*  ALBUMIN 2.7* 2.5*   No results for input(s): LIPASE, AMYLASE in the last 168 hours. No results for input(s): AMMONIA in the last 168 hours. Coagulation Profile: No results for input(s): INR, PROTIME in the last 168 hours. Cardiac Enzymes: No results for input(s): CKTOTAL, CKMB, CKMBINDEX, TROPONINI in the last 168 hours. BNP (last 3 results) No results for input(s): PROBNP in the last 8760 hours. HbA1C: No results for input(s): HGBA1C in the last 72 hours. CBG: Recent Labs  Lab 08/08/18 1135 08/08/18 1651 08/08/18 2155 08/09/18 0720 08/09/18 1140  GLUCAP 284* 228* 248* 239* 198*   Lipid Profile: No results for input(s): CHOL, HDL, LDLCALC, TRIG, CHOLHDL, LDLDIRECT in the last 72 hours. Thyroid Function Tests: No results for input(s): TSH, T4TOTAL, FREET4, T3FREE, THYROIDAB in the last 72 hours. Anemia Panel: No results for input(s): VITAMINB12, FOLATE, FERRITIN,  TIBC, IRON, RETICCTPCT in the last 72 hours. Sepsis Labs: Recent Labs  Lab 08/02/18 1929 08/03/18 0256  LATICACIDVEN 2.12* 0.9    Recent Results (from the past 240 hour(s))  Blood Culture (routine x 2)     Status: Abnormal   Collection Time: 08/02/18  7:18 PM  Result Value Ref Range Status   Specimen Description   Final    BLOOD RIGHT ANTECUBITAL Performed at [facility], [address] [place], [place] [address]    Special Requests   Final    BOTTLES DRAWN AEROBIC AND ANAEROBIC Blood Culture adequate volume Performed at [facility], [address], [place], [place] [address]    Culture  Setup Time   Final    GRAM POSITIVE COCCI AEROBIC AND ANAEROBIC Gram Stain Report Called to,Read Back By and Verified With: COX D. AT 0910A ON [id number]  BY THOMPSON S. CRITICAL RESULT CALLED TO, READ BACK BY AND VERIFIED WITH: HURTH PHARMD AT 1440 ON [id number] BY SJW Performed at [facility], [place] [address] [place],  [address]    Culture STAPHYLOCOCCUS AUREUS (A)  Final   Report Status 08/05/2018 FINAL  Final   Organism ID, Bacteria STAPHYLOCOCCUS AUREUS  Final      Susceptibility   Staphylococcus aureus - MIC*    CIPROFLOXACIN <=0.5 SENSITIVE Sensitive     ERYTHROMYCIN <=0.25 SENSITIVE Sensitive     GENTAMICIN <=0.5 SENSITIVE Sensitive     OXACILLIN <=0.25 SENSITIVE Sensitive     TETRACYCLINE <=1 SENSITIVE Sensitive     VANCOMYCIN 1 SENSITIVE Sensitive     TRIMETH/SULFA <=10 SENSITIVE Sensitive     CLINDAMYCIN <=0.25 SENSITIVE Sensitive     RIFAMPIN <=0.5 SENSITIVE Sensitive     Inducible Clindamycin NEGATIVE Sensitive     * STAPHYLOCOCCUS AUREUS  Blood Culture ID Panel (Reflexed)     Status: Abnormal   Collection Time: 08/02/18  7:18 PM  Result Value Ref Range Status   Enterococcus species NOT DETECTED NOT DETECTED Final   Listeria monocytogenes NOT DETECTED NOT DETECTED Final   Staphylococcus species DETECTED (A) NOT DETECTED Final    Comment: CRITICAL RESULT CALLED TO, READ BACK BY AND VERIFIED WITH: HURTH PHARMD AT 1440 ON [id number] BY SJW    Staphylococcus aureus (BCID) DETECTED (A) NOT DETECTED Final    Comment: Methicillin (oxacillin) susceptible Staphylococcus aureus (MSSA). Preferred therapy is anti staphylococcal beta lactam antibiotic (Cefazolin or Nafcillin), unless clinically contraindicated. CRITICAL RESULT CALLED TO, READ BACK BY AND VERIFIED WITH: HURTH PHARMD AT 1440 ON [id number] BY SJW    Methicillin resistance NOT DETECTED NOT DETECTED Final   Streptococcus species NOT DETECTED NOT DETECTED Final   Streptococcus agalactiae NOT DETECTED NOT DETECTED Final   Streptococcus pneumoniae NOT DETECTED NOT DETECTED Final   Streptococcus pyogenes NOT DETECTED NOT DETECTED Final   Acinetobacter baumannii NOT  DETECTED NOT DETECTED Final   Enterobacteriaceae species NOT DETECTED NOT DETECTED Final   Enterobacter cloacae complex NOT DETECTED NOT DETECTED Final   Escherichia coli NOT DETECTED NOT DETECTED Final   Klebsiella oxytoca NOT DETECTED NOT DETECTED Final   Klebsiella pneumoniae NOT DETECTED NOT DETECTED Final   Proteus species NOT DETECTED NOT DETECTED Final   Serratia marcescens NOT DETECTED NOT DETECTED Final   Haemophilus influenzae NOT DETECTED NOT DETECTED Final   Neisseria meningitidis NOT DETECTED NOT DETECTED Final   Pseudomonas aeruginosa NOT DETECTED NOT DETECTED Final   Candida albicans NOT DETECTED NOT DETECTED Final   Candida glabrata NOT DETECTED NOT DETECTED Final  Candida krusei NOT DETECTED NOT DETECTED Final   Candida parapsilosis NOT DETECTED NOT DETECTED Final   Candida tropicalis NOT DETECTED NOT DETECTED Final    Comment: Performed at [facility], [place] [address], [place], [place] [address]  Blood Culture (routine x 2)     Status: Abnormal   Collection Time: 08/02/18  8:13 PM  Result Value Ref Range Status   Specimen Description   Final    BLOOD LEFT HAND Performed at [facility], [address], [place], [place] [address]    Special Requests   Final    AEROBIC BOTTLE ONLY Blood Culture adequate volume Performed at [facility], [address], [place], [place] [address]    Culture  Setup Time   Final    GRAM POSITIVE COCCI AEROBIC BOTTLE ONLY Gram Stain Report Called to,Read Back By and Verified With: COX D. AT 0910A ON [id number] BY THOMPSON S. CRITICAL VALUE NOTED.  VALUE IS CONSISTENT WITH PREVIOUSLY REPORTED AND CALLED VALUE.    Culture (A)  Final    STAPHYLOCOCCUS AUREUS SUSCEPTIBILITIES PERFORMED ON PREVIOUS CULTURE WITHIN THE LAST 5 DAYS. Performed at [facility], [place] [address], [place], [place] [address]    Report Status 08/05/2018 FINAL  Final  Culture, blood (routine x 2)     Status: None   Collection Time: 08/04/18  5:32 AM    Result Value Ref Range Status   Specimen Description BLOOD RIGHT ARM  Final   Special Requests   Final    BOTTLES DRAWN AEROBIC ONLY Blood Culture results may not be optimal due to an excessive volume of blood received in culture bottles   Culture   Final    NO GROWTH 5 DAYS Performed at [facility], [address] [place], [place] [address]    Report Status 08/09/2018 FINAL  Final  Culture, blood (routine x 2)     Status: None   Collection Time: 08/04/18  6:43 AM  Result Value Ref Range Status   Specimen Description BLOOD LEFT ARM  Final   Special Requests   Final    BOTTLES DRAWN AEROBIC ONLY Blood Culture results may not be optimal due to an inadequate volume of blood received in culture bottles   Culture   Final    NO GROWTH 5 DAYS Performed at [facility], [address] [place], [place] [address]    Report Status 08/09/2018 FINAL  Final    Radiology Studies: No results found.  Scheduled Meds: . atorvastatin  80 mg Oral q1800  . budesonide (PULMICORT) nebulizer solution  0.5 mg Nebulization BID  . cilostazol  100 mg Oral BID  . clopidogrel  75 mg Oral Daily  . insulin aspart  0-20 Units Subcutaneous TID WC  . insulin aspart  0-5 Units Subcutaneous QHS  . insulin aspart  10 Units Subcutaneous TID WC  . insulin glargine  30 Units Subcutaneous Daily  . ipratropium-albuterol  3 mL Nebulization Q6H WA  . losartan  100 mg Oral Daily  . methylPREDNISolone sodium succinate  40 mg Intravenous Q8H  . metoprolol succinate  75 mg Oral BID  . nystatin   Topical TID  . sodium chloride flush  3 mL Intravenous Q12H   Continuous Infusions: . sodium chloride    .  ceFAZolin (ANCEF) IV 2 g (08/09/18 1227)     LOS: 5 days   Time spent= 28 mins  Ronica Vivian, MD [facility]  If 7PM-7AM, please contact night-coverage [email or web address] 08/09/2018,  5:04 PM

## 2018-08-09 NOTE — Clinical Social Work Note (Signed)
Facility just received authorization. Due to late time of receipt they are unable to admit patient until 08/10/2018.    [name] [name], Clydene Pugh, LCSW

## 2018-08-10 LAB — GLUCOSE, CAPILLARY
Glucose-Capillary: 219 mg/dL — ABNORMAL HIGH (ref 70–99)
Glucose-Capillary: 226 mg/dL — ABNORMAL HIGH (ref 70–99)

## 2018-08-10 MED ORDER — ALBUTEROL SULFATE (2.5 MG/3ML) 0.083% IN NEBU: 2.5000 mg | INHALATION_SOLUTION | Freq: Four times a day (QID) | RESPIRATORY_TRACT | 12 refills | Status: AC | PRN

## 2018-08-10 MED ORDER — LORAZEPAM 0.5 MG PO TABS: 0.5000 mg | ORAL_TABLET | Freq: Every evening | ORAL | 0 refills | Status: AC | PRN

## 2018-08-10 MED ORDER — PREDNISONE 10 MG PO TABS: ORAL_TABLET | ORAL | 0 refills | Status: AC

## 2018-08-10 MED ORDER — HEPARIN SOD (PORK) LOCK FLUSH 100 UNIT/ML IV SOLN
500.0000 [IU] | Freq: Once | INTRAVENOUS | Status: DC
  Filled 2018-08-10: qty 5

## 2018-08-10 MED ORDER — BLISTEX MEDICATED EX OINT
TOPICAL_OINTMENT | CUTANEOUS | Status: DC | PRN
  Filled 2018-08-10: qty 6.3

## 2018-08-10 MED ORDER — BUDESONIDE 0.5 MG/2ML IN SUSP: 0.5000 mg | Freq: Two times a day (BID) | RESPIRATORY_TRACT | 12 refills | Status: AC

## 2018-08-10 MED ORDER — IPRATROPIUM-ALBUTEROL 0.5-2.5 (3) MG/3ML IN SOLN: 3.0000 mL | Freq: Three times a day (TID) | RESPIRATORY_TRACT | Status: AC

## 2018-08-10 MED ORDER — NYSTATIN 100000 UNIT/GM EX POWD: Freq: Three times a day (TID) | CUTANEOUS | 0 refills | Status: AC

## 2018-08-10 MED ORDER — CEFAZOLIN IV (FOR PTA / DISCHARGE USE ONLY): 2.0000 g | Freq: Three times a day (TID) | INTRAVENOUS | 0 refills | Status: AC

## 2018-08-10 NOTE — Care Management Important Message (Signed)
Important Message  Patient Details  Name: [name] [name] MRN: [id number] Date of Birth: [birth year]   Medicare Important Message Given:  Yes    Sherald Barge, RN 08/10/2018, 8:47 AM

## 2018-08-10 NOTE — Care Management Important Message (Signed)
Important Message  Patient Details  Name: [name] [name] MRN: [id number] Date of Birth: Aug 15, 1935   Medicare Important Message Given:  Yes    Sherald Barge, RN 08/10/2018, 12:01 PM

## 2018-08-10 NOTE — Progress Notes (Signed)
Pt to transfer to [facility].  This nurse attempted to call report to [phone] with no answer after 11 minutes, no voicemail.  [email or web address]

## 2018-08-10 NOTE — Clinical Social Work Placement (Signed)
   CLINICAL SOCIAL WORK PLACEMENT  NOTE  Date:  08/10/2018  Patient Details  Name: [name] [name] MRN: [id number] Date of Birth: 22-Apr-1936  Clinical Social Work is seeking post-discharge placement for this patient at the [place] level of care (*CSW will initial, date and re-position this form in  chart as items are completed):  Yes   Patient/family provided with [facility] Work Department's list of facilities offering this level of care within the geographic area requested by the patient (or if unable, by the patient's family).  Yes   Patient/family informed of their freedom to choose among providers that offer the needed level of care, that participate in Medicare, Medicaid or managed care program needed by the patient, have an available bed and are willing to accept the patient.  Yes   Patient/family informed of [facility]'s ownership interest in [facility] and [facility], as well as of the fact that they are under no obligation to receive care at these facilities.  PASRR submitted to EDS on 08/07/18     PASRR number received on 08/07/18     Existing PASRR number confirmed on       FL2 transmitted to all facilities in geographic area requested by pt/family on 08/07/18     FL2 transmitted to all facilities within larger geographic area on       Patient informed that his/her managed care company has contracts with or will negotiate with certain facilities, including the following:        Yes   Patient/family informed of bed offers received.  Patient chooses bed at [facility]     Physician recommends and patient chooses bed at      Patient to be transferred to [facility] on 08/10/18.  Patient to be transferred to facility by [facility]     Patient family notified on 08/10/18 of transfer.  Name of family member notified:  daughter     PHYSICIAN       Additional Comment: Pt to be transported to [facility] by EMS.  Coordinated  with nursing staff for d/c.  CSW filled out transportation form. CSW sign off.   _______________________________________________ Trish Mage, LCSW 08/10/2018, 12:35 PM

## 2018-08-10 NOTE — Discharge Summary (Signed)
Physician Discharge Summary  [name] [name] HEN:[id number] DOB: [birth year] DOA: 08/02/2018  PCP: Dettinger, Fransisca Kaufmann, MD  Admit date: 08/02/2018 Discharge date: 08/10/2018  Admitted From: home Disposition:  SNF  Recommendations for Outpatient Follow-up:  1. Follow up with PCP in 1-2 weeks 2. Please obtain BMP/CBC in one week  Home Health: Equipment/Devices:oxygen at 2L  Discharge Condition:stable CODE STATUS: full code Diet recommendation: heart healthy, carb mod  Brief/Interim Summary: 83 year old female with a history of coronary artery disease, hypertension, hyperlipidemia, peripheral vascular disease, who came to the emergency room with generalized weakness.  Patient had experienced a fall at home and was unable to get up for almost 2 hours.  When EMS arrived, she was noted to be hypoxic.  She was admitted to the hospital for COPD exacerbation and met Sirs criteria.  Blood cultures today and positive for MSSA.  Transthoracic echocardiogram was noted to be negative.  Clinically she has improved.  Discharge Diagnoses:  Principal Problem:   MRSA bacteremia Active Problems:   Type 2 diabetes mellitus with other specified complication ([place])   Morbid obesity ([place])   SMOKER   Hypertension associated with diabetes ([place])   Coronary atherosclerosis   PVD   Venous (peripheral) insufficiency   CKD (chronic kidney disease), stage III ([place])   COPD exacerbation ([place])   COPD with acute exacerbation ([facility])   Pressure injury of skin  1. MSSA bacteremia.  Repeat cultures from 1/10 showed no growth to date.  Transthoracic echocardiogram was negative for endocarditis.  Case was reviewed with infectious disease team who recommended total of 4 weeks of intravenous Ancef.  End date is 08/29/2018.   2. Chronic kidney disease stage III.  Creatinine has remained stable throughout hospitalization.  Azotemia likely related to steroid use. 3. COPD exacerbation.  Treated with intravenous steroids and  bronchodilators.  No wheezing at this time.  Respiratory status appears to be stable.  Will transition to prednisone taper. 4. Diabetes.  A1c of 6.3 indicates fair control on oral agents.  Blood sugar should continue to improve as steroids are tapered. 5. Peripheral vascular disease.  Continue on cilostazol and Plavix. 6. Coronary artery disease.  No complaints of chest pain.  Continue home regimen of statin, beta-blockers, Plavix and losartan. 7. Anemia.  No evidence of bleeding.  Possibly related to kidney disease.  Continue to follow hemoglobin.  Discharge Instructions  Discharge Instructions    Diet - low sodium heart healthy   Complete by:  As directed    Home infusion instructions [facility] May follow [place] Dosing Protocol; May administer Cathflo as needed to maintain patency of vascular access device.; Flushing of vascular access device: per [facility] Protocol: 0.9% NaCl pre/post medica...   Complete by:  As directed    Instructions:  May follow [place] Dosing Protocol   Instructions:  May administer Cathflo as needed to maintain patency of vascular access device.   Instructions:  Flushing of vascular access device: per [facility] Protocol: 0.9% NaCl pre/post medication administration and prn patency; Heparin 100 u/ml, 30m for implanted ports and Heparin 10u/ml, 556mfor all other central venous catheters.   Instructions:  May follow [facility] Anaphylaxis Protocol for First Dose Administration in the home: 0.9% NaCl at 25-50 ml/hr to maintain IV access for protocol meds. Epinephrine 0.3 ml IV/IM PRN and Benadryl 25-50 IV/IM PRN s/s of anaphylaxis.   Instructions:  [facility] Coordinator (RN) to assist per patient IV care needs in the home PRN.  Increase activity slowly   Complete by:  As directed      Allergies as of 08/10/2018   No Known Allergies     Medication List    TAKE these medications   albuterol (2.5 MG/3ML) 0.083% nebulizer solution Commonly known as:   PROVENTIL Take 3 mLs (2.5 mg total) by nebulization every 6 (six) hours as needed for wheezing or shortness of breath.   atorvastatin 80 MG tablet Commonly known as:  LIPITOR Take 1 tablet (80 mg total) by mouth daily at 6 PM.   blood glucose meter kit and supplies Dispense based on patient and insurance preference. Use up to four times daily as directed. (FOR ICD-10 E10.9, E11.9).   budesonide 0.5 MG/2ML nebulizer solution Commonly known as:  PULMICORT Take 2 mLs (0.5 mg total) by nebulization 2 (two) times daily.   ceFAZolin  IVPB Commonly known as:  ANCEF Inject 2 g into the vein every 8 (eight) hours for 22 days. Indication:  MSSA bacteremia Last Day of Therapy:  08/29/2018 Labs - Once weekly:  CBC/D and BMP, Labs - Every other week:  ESR and CRP   cilostazol 100 MG tablet Commonly known as:  PLETAL TAKE 1 TABLET (100 MG TOTAL) BY MOUTH 2 (TWO) TIMES DAILY. What changed:  See the new instructions.   clopidogrel 75 MG tablet Commonly known as:  PLAVIX TAKE 1 TABLET (75 MG TOTAL) BY MOUTH DAILY. What changed:  See the new instructions.   diclofenac sodium 1 % Gel Commonly known as:  VOLTAREN Apply 2 g topically daily as needed. Reported on 12/23/2015 What changed:    reasons to take this  additional instructions   furosemide 20 MG tablet Commonly known as:  LASIX Take 1 tablet (20 mg total) by mouth daily.   glimepiride 2 MG tablet Commonly known as:  AMARYL TAKE 1 TABLET (2 MG TOTAL) BY MOUTH DAILY WITH BREAKFAST.   glucose blood test strip Commonly known as:  ONE TOUCH ULTRA TEST Use to check blood sugars daily   ipratropium-albuterol 0.5-2.5 (3) MG/3ML Soln Commonly known as:  DUONEB Take 3 mLs by nebulization 3 (three) times daily.   LORazepam 0.5 MG tablet Commonly known as:  ATIVAN Take 1 tablet (0.5 mg total) by mouth at bedtime as needed for anxiety.   losartan 100 MG tablet Commonly known as:  COZAAR Take 1 tablet (100 mg total) by mouth daily.    metoprolol succinate 50 MG 24 hr tablet Commonly known as:  TOPROL-XL TAKE 1 AND 1/2 TABLETS BY MOUTH 2 (TWO) TIMES DAILY. What changed:  See the new instructions.   nitroGLYCERIN 0.4 MG SL tablet Commonly known as:  NITROSTAT Place 1 tablet (0.4 mg total) under the tongue every 5 (five) minutes as needed for chest pain (3 doses MAX). Reported on 12/23/2015   nystatin powder Commonly known as:  MYCOSTATIN/NYSTOP Apply topically 3 (three) times daily.   ONE TOUCH ULTRA 2 w/Device Kit Use to check blood sugars twice daily   predniSONE 10 MG tablet Commonly known as:  DELTASONE Take '40mg'$  po daily for 2 days then '30mg'$  daily for 2 days then '20mg'$  daily for 2 days then '10mg'$  daily for 2 days then stop   sitaGLIPtin 50 MG tablet Commonly known as:  JANUVIA Take 1 tablet (50 mg total) by mouth daily.            Home Infusion Instuctions  (From admission, onward)         Start  Ordered   08/10/18 0000  Home infusion instructions [facility] May follow [place] Dosing Protocol; May administer Cathflo as needed to maintain patency of vascular access device.; Flushing of vascular access device: per [facility] Protocol: 0.9% NaCl pre/post medica...    Question Answer Comment  Instructions May follow [place] Dosing Protocol   Instructions May administer Cathflo as needed to maintain patency of vascular access device.   Instructions Flushing of vascular access device: per [facility] Protocol: 0.9% NaCl pre/post medication administration and prn patency; Heparin 100 u/ml, 23m for implanted ports and Heparin 10u/ml, 532mfor all other central venous catheters.   Instructions May follow [facility] Anaphylaxis Protocol for First Dose Administration in the home: 0.9% NaCl at 25-50 ml/hr to maintain IV access for protocol meds. Epinephrine 0.3 ml IV/IM PRN and Benadryl 25-50 IV/IM PRN s/s of anaphylaxis.   Instructions [facility] Infusion Coordinator (RN) to assist per patient IV care needs  in the home PRN.      08/10/18 1032          No Known Allergies  Consultations:     Procedures/Studies: Dg Chest Port 1 View  Result Date: 08/07/2018 CLINICAL DATA:  PICC line placement. EXAM: PORTABLE CHEST 1 VIEW COMPARISON:  08/02/2018 FINDINGS: Right-sided PICC line terminates at the expected location of the cavoatrial junction. No evidence of pneumothorax. Mildly enlarged cardiac silhouette with calcific atherosclerotic disease and tortuosity of the aorta. Mild interstitial pulmonary edema. Osseous structures are without acute abnormality. Soft tissues are grossly normal. IMPRESSION: No evidence of pneumothorax post right PICC line placement. Interstitial pulmonary edema. Enlarged cardiac silhouette. Electronically Signed   By: DoFidela Salisbury.D.   On: 08/07/2018 15:54   Dg Chest Port 1 View  Result Date: 08/02/2018 CLINICAL DATA:  Fever EXAM: PORTABLE CHEST 1 VIEW COMPARISON:  09/13/2058 chest radiograph. FINDINGS: Stable cardiomediastinal silhouette with top-normal heart size. No pneumothorax. No pleural effusion. Lungs appear clear, with no acute consolidative airspace disease and no pulmonary edema. IMPRESSION: No active disease. Electronically Signed   By: JaIlona Sorrel.D.   On: 08/02/2018 20:07       Subjective: No new complaints. Denies any shortness of breath  Discharge Exam: Vitals:   08/09/18 1445 08/09/18 2048 08/10/18 0624 08/10/18 0730  BP: (!) 130/43  (!) 158/52   Pulse: 70  76   Resp: 18  18   Temp: 98.2 F (36.8 C)  98 F (36.7 C)   TempSrc: Oral  Oral   SpO2: 94% 93% 96% 98%  Weight:      Height:        General: Pt is alert, awake, not in acute distress Cardiovascular: RRR, S1/S2 +, no rubs, no gallops Respiratory: CTA bilaterally, no wheezing, no rhonchi Abdominal: Soft, NT, ND, bowel sounds + Extremities: no edema, no cyanosis    The results of significant diagnostics from this hospitalization (including imaging, microbiology,  ancillary and laboratory) are listed below for reference.     Microbiology: Recent Results (from the past 240 hour(s))  Blood Culture (routine x 2)     Status: Abnormal   Collection Time: 08/02/18  7:18 PM  Result Value Ref Range Status   Specimen Description   Final    BLOOD RIGHT ANTECUBITAL Performed at [facility] [place] [id number]  Special Requests   Final    BOTTLES DRAWN AEROBIC AND ANAEROBIC Blood Culture adequate volume Performed at [facility] [place]  [address]    Culture  Setup Time   Final    GRAM POSITIVE COCCI AEROBIC AND ANAEROBIC Gram Stain Report Called to,Read Back By and Verified With: COX D. AT 0910A ON [id number] BY THOMPSON S. CRITICAL RESULT CALLED TO, READ BACK BY AND VERIFIED WITH: HURTH PHARMD AT 1440 ON [id number] BY SJW Performed at [facility], [place] [address] [place], [place] [address]    Culture STAPHYLOCOCCUS AUREUS (A)  Final   Report Status 08/05/2018 FINAL  Final   Organism ID, Bacteria STAPHYLOCOCCUS AUREUS  Final      Susceptibility   Staphylococcus aureus - MIC*    CIPROFLOXACIN <=0.5 SENSITIVE Sensitive     ERYTHROMYCIN <=0.25 SENSITIVE Sensitive     GENTAMICIN <=0.5 SENSITIVE Sensitive     OXACILLIN <=0.25 SENSITIVE Sensitive     TETRACYCLINE <=1 SENSITIVE Sensitive     VANCOMYCIN 1 SENSITIVE Sensitive     TRIMETH/SULFA <=10 SENSITIVE Sensitive     CLINDAMYCIN <=0.25 SENSITIVE Sensitive     RIFAMPIN <=0.5 SENSITIVE Sensitive     Inducible Clindamycin NEGATIVE Sensitive     * STAPHYLOCOCCUS AUREUS  Blood Culture ID Panel (Reflexed)     Status: Abnormal   Collection Time: 08/02/18  7:18 PM  Result Value Ref Range Status   Enterococcus species NOT DETECTED NOT DETECTED Final   Listeria monocytogenes NOT DETECTED NOT DETECTED Final   Staphylococcus species DETECTED (A) NOT DETECTED Final    Comment: CRITICAL RESULT CALLED TO, READ BACK BY AND VERIFIED WITH: HURTH PHARMD AT 1440 ON  [id number] BY SJW    Staphylococcus aureus (BCID) DETECTED (A) NOT DETECTED Final    Comment: Methicillin (oxacillin) susceptible Staphylococcus aureus (MSSA). Preferred therapy is anti staphylococcal beta lactam antibiotic (Cefazolin or Nafcillin), unless clinically contraindicated. CRITICAL RESULT CALLED TO, READ BACK BY AND VERIFIED WITH: HURTH PHARMD AT 1440 ON [id number] BY SJW    Methicillin resistance NOT DETECTED NOT DETECTED Final   Streptococcus species NOT DETECTED NOT DETECTED Final   Streptococcus agalactiae NOT DETECTED NOT DETECTED Final   Streptococcus pneumoniae NOT DETECTED NOT DETECTED Final   Streptococcus pyogenes NOT DETECTED NOT DETECTED Final   Acinetobacter baumannii NOT DETECTED NOT DETECTED Final   Enterobacteriaceae species NOT DETECTED NOT DETECTED Final   Enterobacter cloacae complex NOT DETECTED NOT DETECTED Final   Escherichia coli NOT DETECTED NOT DETECTED Final   Klebsiella oxytoca NOT DETECTED NOT DETECTED Final   Klebsiella pneumoniae NOT DETECTED NOT DETECTED Final   Proteus species NOT DETECTED NOT DETECTED Final   Serratia marcescens NOT DETECTED NOT DETECTED Final   Haemophilus influenzae NOT DETECTED NOT DETECTED Final   Neisseria meningitidis NOT DETECTED NOT DETECTED Final   Pseudomonas aeruginosa NOT DETECTED NOT DETECTED Final   Candida albicans NOT DETECTED NOT DETECTED Final   Candida glabrata NOT DETECTED NOT DETECTED Final   Candida krusei NOT DETECTED NOT DETECTED Final   Candida parapsilosis NOT DETECTED NOT DETECTED Final   Candida tropicalis NOT DETECTED NOT DETECTED Final    Comment: Performed at [facility], [address] [place], [place] [address]  Blood Culture (routine x 2)     Status: Abnormal   Collection Time: 08/02/18  8:13 PM  Result Value Ref Range Status   Specimen Description   Final    BLOOD LEFT HAND Performed at [facility], [address] [place], [place] [address]    Special Requests   Final    AEROBIC  BOTTLE ONLY Blood Culture adequate volume  Performed at [facility], [address] [place], [place] [address]    Culture  Setup Time   Final    GRAM POSITIVE COCCI AEROBIC BOTTLE ONLY Gram Stain Report Called to,Read Back By and Verified With: COX D. AT 0910A ON [id number] BY THOMPSON S. CRITICAL VALUE NOTED.  VALUE IS CONSISTENT WITH PREVIOUSLY REPORTED AND CALLED VALUE.    Culture (A)  Final    STAPHYLOCOCCUS AUREUS SUSCEPTIBILITIES PERFORMED ON PREVIOUS CULTURE WITHIN THE LAST 5 DAYS. Performed at [facility], [place] [address] [place], [place] [address]    Report Status 08/05/2018 FINAL  Final  Culture, blood (routine x 2)     Status: None   Collection Time: 08/04/18  5:32 AM  Result Value Ref Range Status   Specimen Description BLOOD RIGHT ARM  Final   Special Requests   Final    BOTTLES DRAWN AEROBIC ONLY Blood Culture results may not be optimal due to an excessive volume of blood received in culture bottles   Culture   Final    NO GROWTH 5 DAYS Performed at [facility], [address] [place], [place] [address]    Report Status 08/09/2018 FINAL  Final  Culture, blood (routine x 2)     Status: None   Collection Time: 08/04/18  6:43 AM  Result Value Ref Range Status   Specimen Description BLOOD LEFT ARM  Final   Special Requests   Final    BOTTLES DRAWN AEROBIC ONLY Blood Culture results may not be optimal due to an inadequate volume of blood received in culture bottles   Culture   Final    NO GROWTH 5 DAYS Performed at [facility], [address] [place], [place] [address]    Report Status 08/09/2018 FINAL  Final     Labs: BNP (last 3 results) Recent Labs    08/02/18 1918  BNP 759.1*   Basic Metabolic Panel: Recent Labs  Lab 08/05/18 0821 08/06/18 0708 08/07/18 0532 08/08/18 0604 08/09/18 0556  NA 136 139 137 138 136  K 4.2 4.2 4.5 4.8 4.8  CL 105 109 105 103 102  CO2 '23 24 24 28 27  '$ GLUCOSE 239* 226* 242* 270* 231*  BUN 32* 40* 42* 48* 52*   CREATININE 1.57* 1.45* 1.48* 1.56* 1.60*  CALCIUM 7.9* 7.8* 7.9* 7.9* 7.7*  MG 2.3 2.3 2.4 2.2 2.3   Liver Function Tests: No results for input(s): AST, ALT, ALKPHOS, BILITOT, PROT, ALBUMIN in the last 168 hours. No results for input(s): LIPASE, AMYLASE in the last 168 hours. No results for input(s): AMMONIA in the last 168 hours. CBC: Recent Labs  Lab 08/04/18 0532 08/05/18 0821 08/06/18 0708 08/07/18 0532 08/08/18 0604 08/09/18 0556  WBC 17.1* 15.9* 11.5* 11.2* 13.0* 15.4*  NEUTROABS 15.9*  --   --   --   --   --   HGB 8.0* 7.8* 7.4* 8.4* 8.8* 8.5*  HCT 27.1* 26.5* 25.9* 28.2* 29.8* 28.4*  MCV 101.5* 100.4* 102.4* 101.4* 101.4* 100.7*  PLT 88* 93* 95* 104* 127* 141*   Cardiac Enzymes: No results for input(s): CKTOTAL, CKMB, CKMBINDEX, TROPONINI in the last 168 hours. BNP: Invalid input(s): POCBNP CBG: Recent Labs  Lab 08/09/18 0720 08/09/18 1140 08/09/18 1725 08/09/18 2023 08/10/18 0753  GLUCAP 239* 198* 218* 106* 219*   D-Dimer No results for input(s): DDIMER in the last 72 hours. Hgb A1c No results for input(s): HGBA1C in the last 72 hours. Lipid Profile No results for input(s): CHOL, HDL,  LDLCALC, TRIG, CHOLHDL, LDLDIRECT in the last 72 hours. Thyroid function studies No results for input(s): TSH, T4TOTAL, T3FREE, THYROIDAB in the last 72 hours.  Invalid input(s): FREET3 Anemia work up No results for input(s): VITAMINB12, FOLATE, FERRITIN, TIBC, IRON, RETICCTPCT in the last 72 hours. Urinalysis    Component Value Date/Time   COLORURINE YELLOW 09/11/2014 1332   APPEARANCEUR Clear 05/09/2017 1655   LABSPEC 1.010 09/11/2014 1332   PHURINE 6.0 09/11/2014 1332   GLUCOSEU Negative 05/09/2017 1655   HGBUR NEGATIVE 09/11/2014 1332   BILIRUBINUR Negative 05/09/2017 1655   KETONESUR NEGATIVE 09/11/2014 1332   PROTEINUR Trace (A) 05/09/2017 1655   PROTEINUR NEGATIVE 09/11/2014 1332   UROBILINOGEN 0.2 09/11/2014 1332   NITRITE Negative 05/09/2017 1655    NITRITE NEGATIVE 09/11/2014 1332   LEUKOCYTESUR 1+ (A) 05/09/2017 1655   Sepsis Labs Invalid input(s): PROCALCITONIN,  WBC,  LACTICIDVEN Microbiology Recent Results (from the past 240 hour(s))  Blood Culture (routine x 2)     Status: Abnormal   Collection Time: 08/02/18  7:18 PM  Result Value Ref Range Status   Specimen Description   Final    BLOOD RIGHT ANTECUBITAL Performed at [facility], [address], [place], [place] [address]    Special Requests   Final    BOTTLES DRAWN AEROBIC AND ANAEROBIC Blood Culture adequate volume Performed at [facility], [address], [place], [place] [address]    Culture  Setup Time   Final    GRAM POSITIVE COCCI AEROBIC AND ANAEROBIC Gram Stain Report Called to,Read Back By and Verified With: COX D. AT 0910A ON [id number] BY THOMPSON S. CRITICAL RESULT CALLED TO, READ BACK BY AND VERIFIED WITH: HURTH PHARMD AT 1440 ON [id number] BY SJW Performed at [facility], [place] [address] [place], [place] [address]    Culture STAPHYLOCOCCUS AUREUS (A)  Final   Report Status 08/05/2018 FINAL  Final   Organism ID, Bacteria STAPHYLOCOCCUS AUREUS  Final      Susceptibility   Staphylococcus aureus - MIC*    CIPROFLOXACIN <=0.5 SENSITIVE Sensitive     ERYTHROMYCIN <=0.25 SENSITIVE Sensitive     GENTAMICIN <=0.5 SENSITIVE Sensitive     OXACILLIN <=0.25 SENSITIVE Sensitive     TETRACYCLINE <=1 SENSITIVE Sensitive     VANCOMYCIN 1 SENSITIVE Sensitive     TRIMETH/SULFA <=10 SENSITIVE Sensitive     CLINDAMYCIN <=0.25 SENSITIVE Sensitive     RIFAMPIN <=0.5 SENSITIVE Sensitive     Inducible Clindamycin NEGATIVE Sensitive     * STAPHYLOCOCCUS AUREUS  Blood Culture ID Panel (Reflexed)     Status: Abnormal   Collection Time: 08/02/18  7:18 PM  Result Value Ref Range Status   Enterococcus species NOT DETECTED NOT DETECTED Final   Listeria monocytogenes NOT DETECTED NOT DETECTED Final   Staphylococcus species DETECTED (A) NOT DETECTED Final    Comment: CRITICAL  RESULT CALLED TO, READ BACK BY AND VERIFIED WITH: HURTH PHARMD AT 1440 ON [id number] BY SJW    Staphylococcus aureus (BCID) DETECTED (A) NOT DETECTED Final    Comment: Methicillin (oxacillin) susceptible Staphylococcus aureus (MSSA). Preferred therapy is anti staphylococcal beta lactam antibiotic (Cefazolin or Nafcillin), unless clinically contraindicated. CRITICAL RESULT CALLED TO, READ BACK BY AND VERIFIED WITH: HURTH PHARMD AT 1440 ON [id number] BY SJW    Methicillin resistance NOT DETECTED NOT DETECTED Final   Streptococcus species NOT DETECTED NOT DETECTED Final   Streptococcus agalactiae NOT DETECTED NOT DETECTED Final   Streptococcus pneumoniae NOT DETECTED NOT DETECTED Final  Streptococcus pyogenes NOT DETECTED NOT DETECTED Final   Acinetobacter baumannii NOT DETECTED NOT DETECTED Final   Enterobacteriaceae species NOT DETECTED NOT DETECTED Final   Enterobacter cloacae complex NOT DETECTED NOT DETECTED Final   Escherichia coli NOT DETECTED NOT DETECTED Final   Klebsiella oxytoca NOT DETECTED NOT DETECTED Final   Klebsiella pneumoniae NOT DETECTED NOT DETECTED Final   Proteus species NOT DETECTED NOT DETECTED Final   Serratia marcescens NOT DETECTED NOT DETECTED Final   Haemophilus influenzae NOT DETECTED NOT DETECTED Final   Neisseria meningitidis NOT DETECTED NOT DETECTED Final   Pseudomonas aeruginosa NOT DETECTED NOT DETECTED Final   Candida albicans NOT DETECTED NOT DETECTED Final   Candida glabrata NOT DETECTED NOT DETECTED Final   Candida krusei NOT DETECTED NOT DETECTED Final   Candida parapsilosis NOT DETECTED NOT DETECTED Final   Candida tropicalis NOT DETECTED NOT DETECTED Final    Comment: Performed at [facility], [place] [address] [place], [place] [address]  Blood Culture (routine x 2)     Status: Abnormal   Collection Time: 08/02/18  8:13 PM  Result Value Ref Range Status   Specimen Description   Final    BLOOD LEFT HAND Performed at [facility], [address] [place], [place] [address]    Special Requests   Final    AEROBIC BOTTLE ONLY Blood Culture adequate volume Performed at [facility], [address] [place], [place] [address]    Culture  Setup Time   Final    GRAM POSITIVE COCCI AEROBIC BOTTLE ONLY Gram Stain Report Called to,Read Back By and Verified With: COX D. AT 0910A ON [id number] BY THOMPSON S. CRITICAL VALUE NOTED.  VALUE IS CONSISTENT WITH PREVIOUSLY REPORTED AND CALLED VALUE.    Culture (A)  Final    STAPHYLOCOCCUS AUREUS SUSCEPTIBILITIES PERFORMED ON PREVIOUS CULTURE WITHIN THE LAST 5 DAYS. Performed at [facility], [place] [address], [place], [place] [address]    Report Status 08/05/2018 FINAL  Final  Culture, blood (routine x 2)     Status: None   Collection Time: 08/04/18  5:32 AM  Result Value Ref Range Status   Specimen Description BLOOD RIGHT ARM  Final   Special Requests   Final    BOTTLES DRAWN AEROBIC ONLY Blood Culture results may not be optimal due to an excessive volume of blood received in culture bottles   Culture   Final    NO GROWTH 5 DAYS Performed at [facility], [address] [place], [place] [address]    Report Status 08/09/2018 FINAL  Final  Culture, blood (routine x 2)     Status: None   Collection Time: 08/04/18  6:43 AM  Result Value Ref Range Status   Specimen Description BLOOD LEFT ARM  Final   Special Requests   Final    BOTTLES DRAWN AEROBIC ONLY Blood Culture results may not be optimal due to an inadequate volume of blood received in culture bottles   Culture   Final    NO GROWTH 5 DAYS Performed at [facility], [address] [place] [address], [place], [place] [address]    Report Status 08/09/2018 FINAL  Final     Time coordinating discharge: 66mns  SIGNED:   JKathie Dike MD  [facility] 08/10/2018, 10:43 AM   If 7PM-7AM, please contact night-coverage [email or web address]

## 2018-08-10 NOTE — Progress Notes (Signed)
Report called to [facility].  [email or web address]

## 2018-08-14 NOTE — Progress Notes (Signed)
Cardiology Office Note    Date:  08/17/2018   ID:  [name] [name], DOB 11/06/1935, MRN [id number]  PCP:  Dettinger, Fransisca Kaufmann, MD  Cardiologist:  Dr. Johnsie Cancel      History of Present Illness:   83 y.o. history of CAD with BMS to RCA in 2006, HTN, PVD post Left CEA 2016, Continued smoking, DM-2,and CDK Baseline Cr 1.6.    She has bilateral carotid disease. 08/2014 had L CEA by Dr Doren Custard complicated by hypoxia. Residual 60-79% left disease. Seen by Dr Elsworth Soho etiology of post op hypoxia unclear.. CT no ILD. Non obstruction on PFT;s despite smoking history .He thought a trial of diuretics was in order for history of diastolic dysfunction even though euvolemic on exam.    03/2015  stopped her lisinopril HCTZ and started Cozaar 138m daily and lasix 268m She saw Dr. DiScot Dockn 09/2015 and he planned to have us[place] her residual right carotid artery disease.40-59% duplex 08/03/17  Still smoking 1 pack every 3 days.  Lives with her  [place] dogs  No chest pain   She does not want to have CXR/CT If I have cancer I don't want to know about it   Hospitalized 08/10/18 after fall unable to get up. Admitted for COPD exacerbation and SIRS BC;s positive for MSSA To get 4 weeks of iv Ancef to end 08/29/18  Had TTE 08/03/18 for bacteremia ordered by Dr CaMegan SalonF 70% No SBE vegetation just AV sclerosis and MAC   Past Medical History:  Diagnosis Date  . Angina 1995  . Anxiety   . Bruit   . CAD   . Carotid artery occlusion   . Cough   . Depression   . DM   . Edema   . HTN (hypertension)   . Hyperlipidemia   . Myocardial infarction ([facility]1995  . OBESITY   . PONV (postoperative nausea and vomiting)   . PVD   . Shortness of breath dyspnea   . VENOUS INSUFFICIENCY     Past Surgical History:  Procedure Laterality Date  . CATARACT EXTRACTION W/PHACO  11/01/2011   Procedure: CATARACT EXTRACTION PHACO AND INTRAOCULAR LENS PLACEMENT (IOC);  Surgeon: KeTonny BranchMD;  Location: [facility];   Service: Ophthalmology;  Laterality: Right;  CDE:12.21  . CATARACT EXTRACTION W/PHACO  01/31/2012   Procedure: CATARACT EXTRACTION PHACO AND INTRAOCULAR LENS PLACEMENT (IOC);  Surgeon: KeTonny BranchMD;  Location: [facility];  Service: Ophthalmology;  Laterality: Left;  CDE:11.77  . CORONARY ANGIOPLASTY WITH STENT PLACEMENT  2006  . ENDARTERECTOMY Left 09/12/2014   Procedure: LEFT CAROTID ARTERY ENDARTERECTOMY;  Surgeon: ChAngelia MouldMD;  [facility] Service: Vascular;  Laterality: Left;  . EYE SURGERY    . EYE SURGERY Bilateral   . PATCH ANGIOPLASTY Left 09/12/2014   Procedure: WITH DACRON PATCH ANGIOPLASTY;  Surgeon: ChAngelia MouldMD;  [facility];  Service: Vascular;  Laterality: Left;    Current Medications: Outpatient Medications Prior to Visit  Medication Sig Dispense Refill  . albuterol (PROVENTIL) (2.5 MG/3ML) 0.083% nebulizer solution Take 3 mLs (2.5 mg total) by nebulization every 6 (six) hours as needed for wheezing or shortness of breath. 75 mL 12  . atorvastatin (LIPITOR) 80 MG tablet Take 1 tablet (80 mg total) by mouth daily at 6 PM. 90 tablet 3  . blood glucose meter kit and supplies Dispense based on patient and insurance preference. Use up to four times daily as directed. (FOR ICD-10 E10.9,  E11.9). 1 each 0  . Blood Glucose Monitoring Suppl (ONE TOUCH ULTRA 2) w/Device KIT Use to check blood sugars twice daily 1 each 1  . budesonide (PULMICORT) 0.5 MG/2ML nebulizer solution Take 2 mLs (0.5 mg total) by nebulization 2 (two) times daily.  12  . ceFAZolin (ANCEF) IVPB Inject 2 g into the vein every 8 (eight) hours for 22 days. Indication:  MSSA bacteremia Last Day of Therapy:  08/29/2018 Labs - Once weekly:  CBC/D and BMP, Labs - Every other week:  ESR and CRP 66 Units 0  . cilostazol (PLETAL) 100 MG tablet TAKE 1 TABLET (100 MG TOTAL) BY MOUTH 2 (TWO) TIMES DAILY. (Patient taking differently: Take 100 mg by mouth 2 (two) times daily. ) 180 tablet 2  .  clopidogrel (PLAVIX) 75 MG tablet TAKE 1 TABLET (75 MG TOTAL) BY MOUTH DAILY. (Patient taking differently: Take 75 mg by mouth daily. ) 90 tablet 0  . diclofenac sodium (VOLTAREN) 1 % GEL Apply 2 g topically daily as needed. Reported on 12/23/2015 (Patient taking differently: Apply 2 g topically daily as needed (for pain). ) 100 g 5  . furosemide (LASIX) 20 MG tablet Take 1 tablet (20 mg total) by mouth daily. 90 tablet 3  . glimepiride (AMARYL) 2 MG tablet TAKE 1 TABLET (2 MG TOTAL) BY MOUTH DAILY WITH BREAKFAST. 90 tablet 0  . glucose blood (ONE TOUCH ULTRA TEST) test strip Use to check blood sugars daily 100 each 3  . ipratropium-albuterol (DUONEB) 0.5-2.5 (3) MG/3ML SOLN Take 3 mLs by nebulization 3 (three) times daily. 360 mL   . LORazepam (ATIVAN) 0.5 MG tablet Take 1 tablet (0.5 mg total) by mouth at bedtime as needed for anxiety. 10 tablet 0  . losartan (COZAAR) 100 MG tablet Take 1 tablet (100 mg total) by mouth daily. 90 tablet 3  . metoprolol succinate (TOPROL-XL) 50 MG 24 hr tablet TAKE 1 AND 1/2 TABLETS BY MOUTH 2 (TWO) TIMES DAILY. (Patient taking differently: Take 75 mg by mouth 2 (two) times daily. ) 270 tablet 2  . nitroGLYCERIN (NITROSTAT) 0.4 MG SL tablet Place 1 tablet (0.4 mg total) under the tongue every 5 (five) minutes as needed for chest pain (3 doses MAX). Reported on 12/23/2015 25 tablet 1  . nystatin (MYCOSTATIN/NYSTOP) powder Apply topically 3 (three) times daily. 15 g 0  . predniSONE (DELTASONE) 10 MG tablet Take 22m po daily for 2 days then 365mdaily for 2 days then 2081maily for 2 days then 51m80mily for 2 days then stop 20 tablet 0  . sitaGLIPtin (JANUVIA) 50 MG tablet Take 1 tablet (50 mg total) by mouth daily. 90 tablet 3   No facility-administered medications prior to visit.      Allergies:   Patient has no known allergies.   Social History   Socioeconomic History  . Marital status: Widowed    Spouse name: Not on file  . Number of children: Not on file    . Years of education: Not on file  . Highest education level: Not on file  Occupational History  . Not on file  Social Needs  . Financial resource strain: Patient refused  . Food insecurity:    Worry: Patient refused    Inability: Patient refused  . Transportation needs:    Medical: Patient refused    Non-medical: Patient refused  Tobacco Use  . Smoking status: Former Smoker    Packs/day: 0.50    Years: 53.00    Pack  years: 26.50    Types: Cigarettes    Last attempt to quit: 07/26/2018    Years since quitting: 0.0  . Smokeless tobacco: Never Used  . Tobacco comment: smoker since age 14 pt has not had cigarette since 09/11/14.  Substance and Sexual Activity  . Alcohol use: No    Alcohol/week: 0.0 standard drinks  . Drug use: No  . Sexual activity: Not Currently    Birth control/protection: Post-menopausal  Lifestyle  . Physical activity:    Days per week: Patient refused    Minutes per session: Patient refused  . Stress: Patient refused  Relationships  . Social connections:    Talks on phone: Patient refused    Gets together: Patient refused    Attends religious service: Patient refused    Active member of club or organization: Patient refused    Attends meetings of clubs or organizations: Patient refused    Relationship status: Patient refused  Other Topics Concern  . Not on file  Social History Narrative  . Not on file     Family History:  The patient's family history includes Anuerysm in her mother; Stroke in her father.   ROS:   Please see the history of present illness.    ROS All other systems reviewed and are negative.   PHYSICAL EXAM:   VS:  BP (!) 92/59   Pulse (!) 159   Ht 5' (1.524 m)   Wt 241 lb (109.3 kg)   SpO2 97%   BMI 47.07 kg/m    Obese Chronically ill  Affect appropriate HEENT: epistaxis nares from oxygen  Neck supple with no adenopathy JVP normal no bruits no thyromegaly Lungs clear with no wheezing and good diaphragmatic  motion Heart:  S1/S2 no murmur, no rub, gallop or click PMI normal Abdomen: benighn, BS positve, no tenderness, no AAA no bruit.  No HSM or HJR Distal pulses intact with no bruits Plus 2 LE  Edema with varicosities  Neuro non-focal Multiple boils and skin lesions on buttocks thighs and legs  Barely able to bear weight    Wt Readings from Last 3 Encounters:  08/17/18 241 lb (109.3 kg)  08/02/18 240 lb (108.9 kg)  04/05/18 235 lb 9.6 oz (106.9 kg)      Studies/Labs Reviewed:   EKG:   01/05/16 NSR HR 70 poor R wave progression otherwise normal  11/18/16  SR rate 67 normal other than isolated PVC   Recent Labs: 08/02/2018: B Natriuretic Peptide 275.0 08/03/2018: ALT 18 08/09/2018: BUN 52; Creatinine, Ser 1.60; Hemoglobin 8.5; Magnesium 2.3; Platelets 141; Potassium 4.8; Sodium 136   Lipid Panel    Component Value Date/Time   CHOL 133 10/24/2017 1148   CHOL 130 10/30/2012 1615   TRIG 138 10/24/2017 1148   TRIG 175 (H) 11/02/2013 1526   TRIG 143 10/30/2012 1615   HDL 42 10/24/2017 1148   HDL 45 11/02/2013 1526   HDL 49 10/30/2012 1615   CHOLHDL 3.2 10/24/2017 1148   LDLCALC 63 10/24/2017 1148   LDLCALC 52 11/02/2013 1526   LDLCALC 52 10/30/2012 1615    Additional studies/ records that were reviewed today include:  2 DECHO: 12/04/2012 LV EF: 55% -  60% Study Conclusions - Left ventricle: The cavity size was normal. Wall thickness was increased in a pattern of mild LVH. Systolic function was normal. The estimated ejection fraction was in the range of 55% to 60%. Wall motion was normal; there were no regional wall motion abnormalities. Doppler parameters are  consistent with abnormal left ventricular relaxation (grade 1 diastolic dysfunction). - Mitral valve: Calcified annulus. - Left atrium: The atrium was mildly dilated.   ASSESSMENT & PLAN:   CAD: stable. Continue plavix, statin and BB.   Carotid artery disease: s/p L CEA. Right 40-59% f/u duplex ordered    Chronic diastolic dysfunction: stable on lasix 24m daily.   HTN: BP well controlled on current regimen.   HLD: continue statin. LDL excellent at 45 on most recent lipid panel ordered by PCP in 11/2015.  DMT2: most recent HgA1c 6.8. Continue to follow with PCP.   Hypoxia: counseled on importance of smoking cessation. No longer on oxygen. Still smoking and doesn't think she can quit.   Does not want to have CXR or lung cancer screening CT   CKD: creat baseline ~ 1.6   Sepsis:  On iv Ancef f/u Dr CMegan Salonafebrile repeat BC;s negative TTE no vegetations   PAF:  New diagnosis on beta blocker add cardizem 30 q6 for rate control she is not a candidate for anti coagulation with falls , epistaxis and age.as well as anemia  F/U in afib clinic 3 weeks and me next available  Overall prognosis is poor   PJenkins Rouge

## 2018-08-16 ENCOUNTER — Ambulatory Visit: Payer: Medicare Other | Admitting: Vascular Surgery

## 2018-08-16 ENCOUNTER — Encounter (HOSPITAL_COMMUNITY): Payer: Medicare Other

## 2018-08-16 ENCOUNTER — Ambulatory Visit: Payer: Medicare Other | Admitting: Cardiovascular Disease

## 2018-08-17 ENCOUNTER — Ambulatory Visit (INDEPENDENT_AMBULATORY_CARE_PROVIDER_SITE_OTHER): Payer: Medicare Other | Admitting: Cardiovascular Disease

## 2018-08-17 ENCOUNTER — Encounter: Payer: Self-pay | Admitting: Cardiovascular Disease

## 2018-08-17 VITALS — BP 92/59 | HR 159 | Ht 60.0 in | Wt 241.0 lb

## 2018-08-17 DIAGNOSIS — I10 Essential (primary) hypertension: Secondary | ICD-10-CM

## 2018-08-17 DIAGNOSIS — I5189 Other ill-defined heart diseases: Secondary | ICD-10-CM | POA: Diagnosis not present

## 2018-08-17 DIAGNOSIS — E785 Hyperlipidemia, unspecified: Secondary | ICD-10-CM

## 2018-08-17 DIAGNOSIS — I2510 Atherosclerotic heart disease of native coronary artery without angina pectoris: Secondary | ICD-10-CM

## 2018-08-17 DIAGNOSIS — R0902 Hypoxemia: Secondary | ICD-10-CM

## 2018-08-17 DIAGNOSIS — I4891 Unspecified atrial fibrillation: Secondary | ICD-10-CM

## 2018-08-17 DIAGNOSIS — I6523 Occlusion and stenosis of bilateral carotid arteries: Secondary | ICD-10-CM

## 2018-08-17 MED ORDER — DILTIAZEM HCL 30 MG PO TABS: 30.0000 mg | ORAL_TABLET | Freq: Four times a day (QID) | ORAL | 11 refills | Status: DC

## 2018-08-17 MED ORDER — DILTIAZEM HCL 30 MG PO TABS: 30.0000 mg | ORAL_TABLET | Freq: Four times a day (QID) | ORAL | 11 refills | Status: AC

## 2018-08-17 MED ORDER — METOPROLOL TARTRATE 25 MG PO TABS: 25.0000 mg | ORAL_TABLET | Freq: Two times a day (BID) | ORAL | 3 refills | Status: DC

## 2018-08-17 NOTE — Patient Instructions (Addendum)
Medication Instructions:  Your physician has recommended you make the following change in your medication:  1-START cardizem    If you need a refill on your cardiac medications before your next appointment, please call your pharmacy.   Lab work:  If you have labs (blood work) drawn today and your tests are completely normal, you will receive your results only by: Marland Kitchen MyChart Message (if you have MyChart) OR . A paper copy in the mail If you have any lab test that is abnormal or we need to change your treatment, we will call you to review the results.  Testing/Procedures: NONE ordered today.  Follow-Up: At [facility], you and your health needs are our priority.  As part of our continuing mission to provide you with exceptional heart care, we have created designated Provider Care Teams.  These Care Teams include your primary Cardiologist (physician) and Advanced Practice Providers (APPs -  Physician Assistants and Nurse Practitioners) who all work together to provide you with the care you need, when you need it.  Your physician recommends that you schedule a follow-up appointment in: 3 to 4 weeks with Atrial Fib clinic.  You will need a follow up appointment in 3 months.  You may see Dr. Johnsie Cancel or one of the following Advanced Practice Providers on your designated Care Team:   Truitt Merle, NP Cecilie Kicks, NP . Kathyrn Drown, NP

## 2018-08-17 NOTE — Addendum Note (Signed)
Addended by: Aris Georgia, Geremiah Fussell L on: 08/17/2018 11:52 AM   Modules accepted: Orders

## 2018-08-18 ENCOUNTER — Ambulatory Visit: Payer: Medicare Other | Admitting: Family Medicine

## 2018-08-26 DEATH — deceased

## 2018-08-30 ENCOUNTER — Encounter (HOSPITAL_COMMUNITY): Payer: Medicare Other

## 2018-08-30 ENCOUNTER — Ambulatory Visit: Payer: Medicare Other | Admitting: Vascular Surgery

## 2018-09-07 ENCOUNTER — Ambulatory Visit (HOSPITAL_COMMUNITY): Payer: Medicare Other | Admitting: Nurse Practitioner

## 2018-11-16 ENCOUNTER — Ambulatory Visit: Payer: Medicare Other | Admitting: Cardiovascular Disease
